# Patient Record
Sex: Male | Born: 1965 | Race: White | Hispanic: No | Marital: Married | Smoking: Never smoker
Health system: Southern US, Community
[De-identification: ages and names within clinical notes are randomized; demographics above are authoritative.]

## PROBLEM LIST (undated history)

## (undated) DIAGNOSIS — IMO0001 Reserved for inherently not codable concepts without codable children: Secondary | ICD-10-CM

## (undated) DIAGNOSIS — F32A Depression, unspecified: Secondary | ICD-10-CM

## (undated) DIAGNOSIS — F329 Major depressive disorder, single episode, unspecified: Secondary | ICD-10-CM

## (undated) DIAGNOSIS — C719 Malignant neoplasm of brain, unspecified: Principal | ICD-10-CM

## (undated) DIAGNOSIS — R7989 Other specified abnormal findings of blood chemistry: Secondary | ICD-10-CM

## (undated) DIAGNOSIS — C4490 Unspecified malignant neoplasm of skin, unspecified: Secondary | ICD-10-CM

## (undated) DIAGNOSIS — IMO0002 Reserved for concepts with insufficient information to code with codable children: Secondary | ICD-10-CM

## (undated) HISTORY — DX: Reserved for concepts with insufficient information to code with codable children: IMO0002

## (undated) HISTORY — DX: Malignant neoplasm of brain, unspecified: C71.9

## (undated) HISTORY — DX: Reserved for inherently not codable concepts without codable children: IMO0001

## (undated) HISTORY — DX: Other specified abnormal findings of blood chemistry: R79.89

## (undated) SURGERY — Surgical Case
Anesthesia: *Unknown

---

## 1986-09-09 DIAGNOSIS — C4490 Unspecified malignant neoplasm of skin, unspecified: Secondary | ICD-10-CM

## 1986-09-09 HISTORY — DX: Unspecified malignant neoplasm of skin, unspecified: C44.90

## 1986-09-09 HISTORY — PX: OTHER SURGICAL HISTORY: SHX169

## 1994-09-09 HISTORY — PX: BACK SURGERY: SHX140

## 2011-09-01 ENCOUNTER — Emergency Department (HOSPITAL_COMMUNITY): Payer: BC Managed Care – PPO

## 2011-09-01 ENCOUNTER — Other Ambulatory Visit: Payer: Self-pay

## 2011-09-01 ENCOUNTER — Inpatient Hospital Stay (HOSPITAL_COMMUNITY)
Admission: EM | Admit: 2011-09-01 | Discharge: 2011-09-06 | DRG: 001 | Disposition: A | Discharge status: Home / Self Care | Payer: BC Managed Care – PPO | Attending: Internal Medicine | Admitting: Internal Medicine

## 2011-09-01 DIAGNOSIS — R7309 Other abnormal glucose: Secondary | ICD-10-CM | POA: Diagnosis not present

## 2011-09-01 DIAGNOSIS — G936 Cerebral edema: Secondary | ICD-10-CM | POA: Diagnosis present

## 2011-09-01 DIAGNOSIS — D496 Neoplasm of unspecified behavior of brain: Principal | ICD-10-CM | POA: Diagnosis present

## 2011-09-01 DIAGNOSIS — G9389 Other specified disorders of brain: Secondary | ICD-10-CM

## 2011-09-01 DIAGNOSIS — R569 Unspecified convulsions: Secondary | ICD-10-CM | POA: Diagnosis present

## 2011-09-01 DIAGNOSIS — Z85828 Personal history of other malignant neoplasm of skin: Secondary | ICD-10-CM

## 2011-09-01 DIAGNOSIS — F4321 Adjustment disorder with depressed mood: Secondary | ICD-10-CM | POA: Diagnosis not present

## 2011-09-01 HISTORY — DX: Major depressive disorder, single episode, unspecified: F32.9

## 2011-09-01 HISTORY — DX: Unspecified malignant neoplasm of skin, unspecified: C44.90

## 2011-09-01 HISTORY — DX: Depression, unspecified: F32.A

## 2011-09-01 LAB — CBC
HCT: 43 % (ref 39.0–52.0)
Hemoglobin: 14.4 g/dL (ref 13.0–17.0)
MCH: 28.5 pg (ref 26.0–34.0)
MCHC: 33.8 g/dL (ref 30.0–36.0)
MCHC: 33.5 g/dL (ref 30.0–36.0)
MCV: 85.1 fL (ref 78.0–100.0)
MCV: 85.1 fL (ref 78.0–100.0)
Platelets: 147 10*3/uL — ABNORMAL LOW (ref 150–400)
Platelets: 134 10*3/uL — ABNORMAL LOW (ref 150–400)
RBC: 5.05 MIL/uL (ref 4.22–5.81)
RDW: 12.8 % (ref 11.5–15.5)
RDW: 12.9 % (ref 11.5–15.5)
WBC: 12.4 10*3/uL — ABNORMAL HIGH (ref 4.0–10.5)
WBC: 9.4 10*3/uL (ref 4.0–10.5)

## 2011-09-01 LAB — COMPREHENSIVE METABOLIC PANEL
ALT: 17 U/L (ref 0–53)
Alkaline Phosphatase: 63 U/L (ref 39–117)
BUN: 11 mg/dL (ref 6–23)
Chloride: 106 mEq/L (ref 96–112)
GFR calc Af Amer: >90 mL/min (ref >90)
Glucose, Bld: 160 mg/dL — ABNORMAL HIGH (ref 70–99)
Potassium: 3.9 mEq/L (ref 3.5–5.1)
Sodium: 140 mEq/L (ref 135–145)
Total Bilirubin: 0.2 mg/dL — ABNORMAL LOW (ref 0.3–1.2)

## 2011-09-01 LAB — PHENYTOIN LEVEL, TOTAL: Phenytoin Lvl: 13.6 ug/mL (ref 10.0–20.0)

## 2011-09-01 LAB — CREATININE, SERUM
Creatinine, Ser: 0.91 mg/dL (ref 0.50–1.35)
GFR calc Af Amer: >90 mL/min (ref >90)

## 2011-09-01 LAB — MAGNESIUM: Magnesium: 2.2 mg/dL (ref 1.5–2.5)

## 2011-09-01 MED ORDER — ACETAMINOPHEN 325 MG PO TABS: 650.0000 mg | ORAL_TABLET | Freq: Four times a day (QID) | ORAL | Status: DC | PRN

## 2011-09-01 MED ORDER — ACETAMINOPHEN 650 MG RE SUPP: 650.0000 mg | Freq: Four times a day (QID) | RECTAL | Status: DC | PRN

## 2011-09-01 MED ORDER — PHENYTOIN SODIUM 50 MG/ML IJ SOLN
100.0000 mg | Freq: Three times a day (TID) | INTRAMUSCULAR | Status: DC
  Filled 2011-09-01: qty 2

## 2011-09-01 MED ORDER — THERA M PLUS PO TABS
1.0000 | ORAL_TABLET | Freq: Every day | ORAL | Status: DC
  Administered 2011-09-01 – 2011-09-06 (×5): 1 via ORAL
  Filled 2011-09-01 (×6): qty 1

## 2011-09-01 MED ORDER — OXYCODONE HCL 5 MG PO TABS: 5.0000 mg | ORAL_TABLET | ORAL | Status: DC | PRN

## 2011-09-01 MED ORDER — GADOBENATE DIMEGLUMINE 529 MG/ML IV SOLN
15.0000 mL | Freq: Once | INTRAVENOUS | Status: AC | PRN
  Administered 2011-09-01: 15 mL via INTRAVENOUS

## 2011-09-01 MED ORDER — MORPHINE SULFATE 2 MG/ML IJ SOLN: 2.0000 mg | INTRAMUSCULAR | Status: DC | PRN

## 2011-09-01 MED ORDER — PHENYTOIN SODIUM 50 MG/ML IJ SOLN
100.0000 mg | Freq: Three times a day (TID) | INTRAMUSCULAR | Status: DC
  Filled 2011-09-01 (×2): qty 2

## 2011-09-01 MED ORDER — DEXAMETHASONE SODIUM PHOSPHATE 10 MG/ML IJ SOLN
10.0000 mg | INTRAMUSCULAR | Status: AC
  Administered 2011-09-01: 10 mg via INTRAVENOUS
  Filled 2011-09-01: qty 1

## 2011-09-01 MED ORDER — DEXAMETHASONE SODIUM PHOSPHATE 4 MG/ML IJ SOLN
4.0000 mg | Freq: Four times a day (QID) | INTRAMUSCULAR | Status: DC
  Administered 2011-09-01 – 2011-09-05 (×14): 4 mg via INTRAVENOUS
  Filled 2011-09-01 (×21): qty 1

## 2011-09-01 MED ORDER — ONDANSETRON HCL 4 MG PO TABS: 4.0000 mg | ORAL_TABLET | Freq: Four times a day (QID) | ORAL | Status: DC | PRN

## 2011-09-01 MED ORDER — ONDANSETRON HCL 4 MG/2ML IJ SOLN: 4.0000 mg | Freq: Four times a day (QID) | INTRAMUSCULAR | Status: DC | PRN

## 2011-09-01 MED ORDER — PHENYTOIN SODIUM 50 MG/ML IJ SOLN
100.0000 mg | Freq: Three times a day (TID) | INTRAMUSCULAR | Status: DC
  Administered 2011-09-02 (×2): 100 mg via INTRAVENOUS
  Filled 2011-09-01 (×5): qty 2

## 2011-09-01 MED ORDER — SODIUM CHLORIDE 0.9 % IV BOLUS (SEPSIS)
250.0000 mL | Freq: Once | INTRAVENOUS | Status: AC
  Administered 2011-09-01: 250 mL via INTRAVENOUS

## 2011-09-01 MED ORDER — HEPARIN SODIUM (PORCINE) 5000 UNIT/ML IJ SOLN
5000.0000 [IU] | Freq: Three times a day (TID) | INTRAMUSCULAR | Status: DC
  Administered 2011-09-01 – 2011-09-04 (×8): 5000 [IU] via SUBCUTANEOUS
  Filled 2011-09-01 (×11): qty 1

## 2011-09-01 MED ORDER — SODIUM CHLORIDE 0.9 % IV SOLN
INTRAVENOUS | Status: DC
  Administered 2011-09-01: 22:00:00 via INTRAVENOUS
  Administered 2011-09-02: 20 mL/h via INTRAVENOUS
  Administered 2011-09-02: 100 mL/h via INTRAVENOUS

## 2011-09-01 MED ORDER — SODIUM CHLORIDE 0.9 % IV SOLN
1000.0000 mg | Freq: Once | INTRAVENOUS | Status: AC
  Administered 2011-09-01: 1000 mg via INTRAVENOUS
  Filled 2011-09-01 (×2): qty 20

## 2011-09-01 MED ORDER — OMEGA-3-ACID ETHYL ESTERS 1 G PO CAPS
1.0000 g | ORAL_CAPSULE | Freq: Every day | ORAL | Status: DC
  Administered 2011-09-01 – 2011-09-03 (×3): 1 g via ORAL
  Filled 2011-09-01 (×4): qty 1

## 2011-09-01 NOTE — Consult Note (Signed)
Reason for Consult:New onset seizure  Referring Physician: Deretha Emory  CC: Seizure  HPI: Nathan Prince is an 45 y.o. male who presents after having had a seizure.  Wife reports that they were in bed and everything was well.  At about 1100 she began to ask him some questions about what he wanted to eat and he was unable to respond intelligibly.  Then when she asked to see his tongue he pointed to his nose.  Soon after the patient sat up in bed and had what is described as a tonic-clonic seizure.  EMS was called at that time.  Patient has remained confused and unable to communicate appropriately.    Past Medical History  Diagnosis Date  . Depression   . Skin cancer     History reviewed. No pertinent past surgical history.  History reviewed. No pertinent family history.  Social History:  reports that he has never smoked. He does not have any smokeless tobacco history on file. He reports that he does not drink alcohol or use illicit drugs.  No Known Allergies  Medications:  I have reviewed the patient's current medications. Prior to Admission:  Fish Oil, MVI, Omega-3 Scheduled:   . sodium chloride  250 mL Intravenous Once       Propofol  ROS: History obtained from wife  General ROS: negative for - chills, fatigue, fever, night sweats, weight gain or weight loss Psychological ROS: confusion Ophthalmic ROS: negative for - blurry vision, double vision, eye pain or loss of vision ENT ROS: negative for - epistaxis, nasal discharge, oral lesions, sore throat, tinnitus or vertigo Allergy and Immunology ROS: negative for - hives or itchy/watery eyes Hematological and Lymphatic ROS: negative for - bleeding problems, bruising or swollen lymph nodes Endocrine ROS: negative for - galactorrhea, hair pattern changes, polydipsia/polyuria or temperature intolerance Respiratory ROS: negative for - cough, hemoptysis, shortness of breath or wheezing Cardiovascular ROS: negative for - chest pain,  dyspnea on exertion, edema or irregular heartbeat Gastrointestinal ROS: negative for - abdominal pain, diarrhea, hematemesis, nausea/vomiting or stool incontinence Genito-Urinary ROS: negative for - dysuria, hematuria, incontinence or urinary frequency/urgency Musculoskeletal ROS: negative for - joint swelling or muscular weakness Neurological ROS: as noted in HPI Dermatological ROS: negative for rash and skin lesion changes  Blood pressure 118/53, pulse 78, temperature 96.1 F (35.6 C), temperature source Axillary, resp. rate 20, SpO2 100.00%.  Neurologic Examination: Mental Status: Lethargic. Paraphasic errors.  Speech non-fluent.  Unable to follow commands. Cranial Nerves: II: visual fields grossly normal, pupils equal, round, reactive to light and accommodation III,IV, VI: ptosis not present, extra-ocular motions intact bilaterally V,VII: right facial droop, facial light touch sensation normal bilaterally VIII: hearing normal bilaterally IX,X: gag reflex present XI: trapezius strength/neck flexion strength normal bilaterally XII: tongue strength normal  Motor: Right : Upper extremity   5/5    Left:     Upper extremity   5/5  Lower extremity   5/5     Lower extremity   5/5 Tone and bulk:normal tone throughout; no atrophy noted Sensory: Pinprick and light touch intact throughout, bilaterally Deep Tendon Reflexes: 2+ and symmetric with absent AJ's bilaterally Plantars: Right: equivocal   Left: equivocal Cerebellar: Unable to follow commands to perform  Results for orders placed during the hospital encounter of 09/01/11 (from the past 48 hour(s))  COMPREHENSIVE METABOLIC PANEL     Status: Abnormal   Collection Time   09/01/11  2:35 PM      Component Value Range Comment  Sodium 140  135 - 145 (mEq/L)    Potassium 3.9  3.5 - 5.1 (mEq/L)    Chloride 106  96 - 112 (mEq/L)    CO2 25  19 - 32 (mEq/L)    Glucose, Bld 160 (*) 70 - 99 (mg/dL)    BUN 11  6 - 23 (mg/dL)     Creatinine, Ser 0.98  0.50 - 1.35 (mg/dL)    Calcium 9.3  8.4 - 10.5 (mg/dL)    Total Protein 6.6  6.0 - 8.3 (g/dL)    Albumin 3.9  3.5 - 5.2 (g/dL)    AST 17  0 - 37 (U/L)    ALT 17  0 - 53 (U/L)    Alkaline Phosphatase 63  39 - 117 (U/L)    Total Bilirubin 0.2 (*) 0.3 - 1.2 (mg/dL)    GFR calc non Af Amer 85 (*) >90 (mL/min)    GFR calc Af Amer >90  >90 (mL/min)   CBC     Status: Abnormal   Collection Time   09/01/11  2:35 PM      Component Value Range Comment   WBC 12.4 (*) 4.0 - 10.5 (K/uL)    RBC 5.11  4.22 - 5.81 (MIL/uL)    Hemoglobin 14.7  13.0 - 17.0 (g/dL)    HCT 11.9  14.7 - 82.9 (%)    MCV 85.1  78.0 - 100.0 (fL)    MCH 28.8  26.0 - 34.0 (pg)    MCHC 33.8  30.0 - 36.0 (g/dL)    RDW 56.2  13.0 - 86.5 (%)    Platelets 147 (*) 150 - 400 (K/uL)     Ct Head Wo Contrast  09/01/2011  *RADIOLOGY REPORT*  Clinical Data: Seizure  CT HEAD WITHOUT CONTRAST  Technique:  Contiguous axial images were obtained from the base of the skull through the vertex without contrast.  Comparison: None.  Findings: There is an approximately 3 cm x 4 cm hypodensity in the left temporal lobe which extends to the cortical margin of the left temporal operculum (images 16 through 9).  This finding is most suggestive of a cortical infarction but cannot exclude an underlying neoplasm.  There is no intracranial hemorrhage evident. No midline shift or mass effect.  No hydrocephalus.  Paranasal sinuses and mastoid air cells are clear.  Orbits are normal.  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  Cannot exclude underlying neoplasm.  Recommend brain MRI for further evaluation.  Findings discussed with Dr. Deretha Emory on 09/01/2011 at 1415 hours  Original Report Authenticated By: Genevive Bi, M.D.     Assessment/Plan:  Patient Active Hospital Problem List: New Onset Seizure   Assessment: New onset seizure in patient with hypodensity in the left temporal lobe.  Likely etiology  of seizures.  Etiology of hypodensity unclear-?mass lesion versus stroke.     Plan: 1. MRI of the brain with contrast  2. Dilantin 1,000mg  IV load. Level 2 hours after load with maintenance of 100mg  IV q8hrs             3. Dilantin level in AM  4. EEG  5. Seizure precautions  Thana Farr, MD [facility] [phone] 09/01/2011, 4:01 PM

## 2011-09-01 NOTE — ED Provider Notes (Signed)
Medical screening examination/treatment/procedure(s) were conducted as a shared visit with non-physician practitioner(s) and myself.  I personally evaluated the patient during the encounter  CT scan now reveals of left temporal lobe mass questionable infarct most likely mass. Patient not back to normal but able to verbal lives in answer questions. Event somewhere around 11:00 or 11:30 this morning.  Discussed with on-call neurology will get admitted by internal medicine MRI will be ordered. Neurology also suspects tumor.    Shelda Jakes, MD 09/01/11 [phone]

## 2011-09-01 NOTE — Progress Notes (Signed)
MEDICATION RELATED CONSULT NOTE - INITIAL   Pharmacy Consult:  Dilantin Indication:  seizure  No Known Allergies  Patient Measurements: Height: 5\' 10"  (177.8 cm) Weight: 177 lb 7.5 oz (80.5 kg) IBW/kg (Calculated) : 73   Vital Signs: Temp: 98.2 F (36.8 C) (12/23 2030) Temp src: Oral (12/23 2030) BP: 126/73 mmHg (12/23 2030) Pulse Rate: 67  (12/23 2030)  Labs:  Basename 09/01/11 1435  WBC 12.4*  HGB 14.7  HCT 43.5  PLT 147*  APTT --  CREATININE 1.03  LABCREA --  CREATININE 1.03  CREAT24HRUR --  MG --  PHOS --  ALBUMIN 3.9  PROT 6.6  ALBUMIN 3.9  AST 17  ALT 17  ALKPHOS 63  BILITOT 0.2*  BILIDIR --  IBILI --   Estimated Creatinine Clearance: 91.5 ml/min (by C-G formula based on Cr of 1.03).   Microbiology: No results found for this or any previous visit (from the past 720 hour(s)).  Medical History: Past Medical History  Diagnosis Date  . Depression   . Skin cancer     Assessment:  seizure Admit Complaint: Pharmacist System-Based Medication Review: Anticoagulation:  N/A Infectious Disease:  N/A Cardiovascular:  No significant hx, VSS, on [place] Endocrinology:  N/A Gastrointestinal / Nutrition:  NPO Neurology:  Hx depression.  Admitted post tonic-clonic seizure from home, head CT showed intracranial neoplasm in which dexamethasone was started.  Patient received Dilantin 1000mg  IV load in the ED and was started on Dilantin 100mg  IV Q8H. Nephrology:  SCr 1.03, CrCL 92 ml/min, lytes WNL Pulmonary:  RA Hematology / Oncology;  Hx skin cancer left upper chest 1988.  H/H WNL, plts slightly low 147 Best Practices:  Heparin SQ  Goal of Therapy:  Corrected dilantin level 10-20 mcg/mL  Plan:  1.  Continue dilantin 100mg  IV Q8H as ordered by Neuro 2.  F/U dilantin level post load and in AM 3.  Dilantin level at steady-state (5-7 days on maintenance regimen).  Phillips Climes Dien 09/01/2011,9:34 PM

## 2011-09-01 NOTE — ED Notes (Signed)
MD at bedside. (Dr. Reynolds at the bedside).  

## 2011-09-01 NOTE — ED Notes (Addendum)
N[facility] [name]  [facility]  [phone]   [phone]

## 2011-09-01 NOTE — ED Provider Notes (Signed)
History     CSN: [id number]  Arrival date & time 09/01/11  1320   First MD Initiated Contact with Patient 09/01/11 1356      Chief Complaint  Patient presents with  . Seizures    (Consider location/radiation/quality/duration/timing/severity/associated sxs/prior treatment) The history is provided by the patient, the spouse, the EMS personnel and a friend.    Pt presents to the ED by EMS after having a first time seizure. Pt was last normal at 11:00am (approx) when he was intimate with her and had an acute onset headache in which he was then unable to speak what he wanted to say. He then had a seizure with incontinence. Currently the patient is post ictal, will respond to questions if asked but not appropriately. 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 No chemo or radiation was done but that patient has since then had close follow up. No hx of neoplasm in the brain.   Past Medical History  Diagnosis Date  . Depression   . Skin cancer     History reviewed. No pertinent past surgical history.  History reviewed. No pertinent family history.  History  Substance Use Topics  . Smoking status: Never Smoker   . Smokeless tobacco: Not on file  . Alcohol Use: No      Review of Systems  All other systems reviewed and are negative.    Allergies  Review of patient's allergies indicates no known allergies.  Home Medications   Current Outpatient Rx  Name Route Sig Dispense Refill  . OMEGA-3 FATTY ACIDS 1000 MG PO CAPS Oral Take 1 g by mouth daily.      Carma Leaven M PLUS PO TABS Oral Take 1 tablet by mouth daily.      . OMEGA-3 KRILL OIL PO Oral Take 1 capsule by mouth daily.        BP 112/74  Pulse 82  Temp(Src) 96.1 F (35.6 C) (Axillary)  Resp 22  SpO2 100%  Physical Exam  Nursing note and vitals reviewed. Constitutional: He appears well-developed and well-nourished. He is easily aroused. He has  a sickly appearance. No distress.  HENT:  Head: Normocephalic and atraumatic.  Eyes: Pupils are equal, round, and reactive to light.  Neck: Normal range of motion. No JVD present. No tracheal deviation present.  Cardiovascular: Normal rate, regular rhythm and normal heart sounds.   Pulmonary/Chest: Effort normal and breath sounds normal. No respiratory distress. He has no wheezes. He has no rales. He exhibits no tenderness.  Abdominal: Soft.  Musculoskeletal: Normal range of motion.  Neurological: He is alert and easily aroused.  Skin: Skin is warm and dry. He is not diaphoretic.    ED Course  Procedures (including critical care time)   Labs Reviewed  COMPREHENSIVE METABOLIC PANEL  CBC   Ct Head Wo Contrast  09/01/2011  *RADIOLOGY REPORT*  Clinical Data: Seizure  CT HEAD WITHOUT CONTRAST  Technique:  Contiguous axial images were obtained from the base of the skull through the vertex without contrast.  Comparison: None.  Findings: There is an approximately 3 cm x 4 cm hypodensity in the left temporal lobe which extends to the cortical margin of the left temporal operculum (images 16 through 9).  This finding is most suggestive of a cortical infarction but cannot exclude an underlying neoplasm.  There is no intracranial hemorrhage evident. No midline shift or mass effect.  No hydrocephalus.  Paranasal sinuses and mastoid air cells are clear.  Orbits are normal.  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  Cannot exclude underlying neoplasm.  Recommend brain MRI for further evaluation.  Findings discussed with Dr. Deretha Emory on 09/01/2011 at 1415 hours  Original Report Authenticated By: Genevive Bi, M.D.     1. Brain mass       MDM  CT scan of the brain is concerning for infarct or possibly a neoplasm. The radiologist recommend MRI with contrast. I have spoken with Dr. Thad Ranger (Neurology) who requests the patient be a medical admit and she will consult  on the patient.   Date: 09/01/2011  Rate: 87  Rhythm: normal sinus rhythm  QRS Axis: normal  Intervals: normal  ST/T Wave abnormalities: normal  Conduction Disutrbances:none  Narrative Interpretation:   Old EKG Reviewed: none available        Dorthula Matas, PA 09/01/11 1709

## 2011-09-01 NOTE — ED Notes (Signed)
Patient transported to MRI 

## 2011-09-01 NOTE — ED Notes (Signed)
Pt normal at 10 am after intimacy, at 1015 sudden onset HA and period of time where unable to process language, then full body seizure. Postictal, incontinent, combative initally

## 2011-09-01 NOTE — H&P (Addendum)
PCP:   No primary provider on file.   Chief Complaint:  Seizure episode this morning.  HPI: This is a 45 year old male, with known history of rare skin cancer left upper chest in 1988, treated with surgery at [facility], back surgery for ruptured disk 1996,  otherwise, no significant medical history. Patient is still somewhat weak and forgetful of preceding events, so history is supplied by spouse, who was present in ED. On waking up at about 11:30 AM, spouse got out of bed, and was getting ready to go out and get a sandwich. While patient was trying to decide which he wanted, he complained suddenly of a headache, his speech became confused, he sounded disoriented. Wife hurried off to get some sugar, but her son, who was at the bedside, yelled out, and she came back quickly, in time to see patient start "staring", become stiff, panting heavily. Wife had just reached for the phone, to call 911, when patient's teeth clenched, and he suffered a generalized seizure. There was no incontinence or tongue-biting, but he was drooling, and after about a minute, the episode subsided, but patient was unrousable. EMS arrived, and brought patient to ED, where he remained post-ictal for some time, and was seen by Dr Thad Ranger, neurologist. Due to concerning brain imaging studies, he was referred to the medical service, for treatment and appropriate work up..  Allergies:  No Known Allergies Allergic to cats.   Past Medical History  Diagnosis Date  . Depression   . Skin cancer     History reviewed. No pertinent past surgical history.  Prior to Admission medications   Medication Sig Start Date End Date Taking? Authorizing Provider  fish oil-omega-3 fatty acids 1000 MG capsule Take 1 g by mouth daily.     Yes Historical Provider, MD  Multiple Vitamins-Minerals (MULTIVITAMINS THER. W/MINERALS) TABS Take 1 tablet by mouth daily.     Yes Historical Provider, MD  OMEGA-3 KRILL OIL PO Take 1 capsule by  mouth daily.     Yes Historical Provider, MD    Social History:  reports that he has never smoked. He does not have any smokeless tobacco history on file. He reports that he does not drink alcohol or use illicit drugs. 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  History reviewed. No pertinent family history. Patient's mother died at age 22 years. She has multiple sclerosis and emphysema. His father died at age 71 years, from clon cancer.  Review of Systems:  As per HPI and chief complaint. Patent denies fatigue, diminished appetite, weight loss, fever, chills, headache, blurred vision, difficulty in speaking, dyspahgia, chest pain, cough, shortness of breath, orthopnea, paroxysmal nocturnal dyspnea, nausea, diaphoresis, abdominal pain, vomiting, diarrhea, belching, heartburn, hematemesis, melena, dysuria, nocturia, urinary frequency, hematochezia, lower extremity swelling, pain, or redness. The rest of the systems review is negative.  Physical Exam:  General:  Patient does not appear to be in obvious acute distress at the time of this evaluation, no longer post-ictal, although forgetful of recent events, alert, communicative, fully oriented, talking in complete sentences, not short of breath at rest.  HEENT:  No clinical pallor, no jaundice, no conjunctival injection or discharge. Hydration appears fair. NECK:  Supple, JVP not seen, no carotid bruits, no palpable lymphadenopathy, no palpable goiter. CHEST:  Clinically clear to auscultation, no wheezes, no crackles. Patient has a depressed scar, approximately  6 cm x 7 cm in left anterior upper chest, at site of previous skin  cancer surgery. No axillary lymphadenopathy. HEART:  Sounds 1 and 2 heard, normal, regular, no murmurs. ABDOMEN:  Full, soft, non-tender, no palpable organomegaly, no palpable masses, normal bowel sounds. GENITALIA:  Not examined. LOWER EXTREMITIES:  No pitting edema, palpable peripheral  pulses. MUSCULOSKELETAL SYSTEM:  Generalized osteoarthritic changes, otherwise, normal. CENTRAL NERVOUS SYSTEM:  No focal neurologic deficit on gross examination.  Labs on Admission:  Results for orders placed during the hospital encounter of 09/01/11 (from the past 48 hour(s))  COMPREHENSIVE METABOLIC PANEL     Status: Abnormal   Collection Time   09/01/11  2:35 PM      Component Value Range Comment   Sodium 140  135 - 145 (mEq/L)    Potassium 3.9  3.5 - 5.1 (mEq/L)    Chloride 106  96 - 112 (mEq/L)    CO2 25  19 - 32 (mEq/L)    Glucose, Bld 160 (*) 70 - 99 (mg/dL)    BUN 11  6 - 23 (mg/dL)    Creatinine, Ser 0.45  0.50 - 1.35 (mg/dL)    Calcium 9.3  8.4 - 10.5 (mg/dL)    Total Protein 6.6  6.0 - 8.3 (g/dL)    Albumin 3.9  3.5 - 5.2 (g/dL)    AST 17  0 - 37 (U/L)    ALT 17  0 - 53 (U/L)    Alkaline Phosphatase 63  39 - 117 (U/L)    Total Bilirubin 0.2 (*) 0.3 - 1.2 (mg/dL)    GFR calc non Af Amer 85 (*) >90 (mL/min)    GFR calc Af Amer >90  >90 (mL/min)   CBC     Status: Abnormal   Collection Time   09/01/11  2:35 PM      Component Value Range Comment   WBC 12.4 (*) 4.0 - 10.5 (K/uL)    RBC 5.11  4.22 - 5.81 (MIL/uL)    Hemoglobin 14.7  13.0 - 17.0 (g/dL)    HCT 40.9  81.1 - 91.4 (%)    MCV 85.1  78.0 - 100.0 (fL)    MCH 28.8  26.0 - 34.0 (pg)    MCHC 33.8  30.0 - 36.0 (g/dL)    RDW 78.2  95.6 - 21.3 (%)    Platelets 147 (*) 150 - 400 (K/uL)     Radiological Exams on Admission:  RADIOLOGY REPORT*  Clinical Data: Seizure  CT HEAD WITHOUT CONTRAST  Technique: Contiguous axial images were obtained from the base of  the skull through the vertex without contrast.  Comparison: None.  Findings: There is an approximately 3 cm x 4 cm hypodensity in the  left temporal lobe which extends to the cortical margin of the left  temporal operculum (images 16 through 9). This finding is most  suggestive of a cortical infarction but cannot exclude an  underlying neoplasm. There is  no intracranial hemorrhage evident.  No midline shift or mass effect. No hydrocephalus.  Paranasal sinuses and mastoid air cells are clear. Orbits are  normal.  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 Cannot  exclude underlying neoplasm. Recommend brain MRI for further  evaluation.  Findings discussed with Dr. Deretha Emory on 09/01/2011 at 1415 hours  Original Report Authenticated By: Genevive Bi, M.D.  Assessment/Plan  * Principal Problem. *  Seizure episode: Patient resented with a single witnessed seizure episode, probably focal, with secondary generalization. Head Ct scan demonstrated findings consistent with probable cortical infarction within the left temporal lobe, although certain  features raised suspicion for possible intracranial neoplasm. An MRI has been done, but at the time of this evaluation, formal report was still pending. I did discuss with Dr Thad Ranger, who has already seen the patient and provided a consultation, and it appears that preliminary  MRI findings are suspicious for solitary, possibly metastatic neoplasm. Patient has already been loaded with Dilantin in the ED, and we shall continue scheduled Dilantin for now. He will be admitted to SDU, have neurochecks, and Chest/Abdominal/Pelvic CT, will be arranged, to search for a possible primary neoplasm. evaluation.    Further management will depend on clinical course.  Time Spent on Admission: 45 mins.  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 Have started dexamethasone.  C. Tavion Senkbeil. MD.

## 2011-09-01 NOTE — ED Notes (Signed)
Paged [facility] 

## 2011-09-02 ENCOUNTER — Inpatient Hospital Stay (HOSPITAL_COMMUNITY): Payer: BC Managed Care – PPO

## 2011-09-02 ENCOUNTER — Encounter: Payer: Self-pay | Admitting: Neurosurgery

## 2011-09-02 ENCOUNTER — Other Ambulatory Visit: Payer: Self-pay | Admitting: Neurosurgery

## 2011-09-02 LAB — COMPREHENSIVE METABOLIC PANEL
ALT: 18 U/L (ref 0–53)
AST: 23 U/L (ref 0–37)
Albumin: 4.1 g/dL (ref 3.5–5.2)
Calcium: 10.1 mg/dL (ref 8.4–10.5)
Creatinine, Ser: 1.05 mg/dL (ref 0.50–1.35)
Sodium: 141 mEq/L (ref 135–145)

## 2011-09-02 LAB — CBC
HCT: 45.9 % (ref 39.0–52.0)
Hemoglobin: 15.4 g/dL (ref 13.0–17.0)
MCH: 28.7 pg (ref 26.0–34.0)
MCHC: 33.6 g/dL (ref 30.0–36.0)
MCV: 85.6 fL (ref 78.0–100.0)
RBC: 5.36 MIL/uL (ref 4.22–5.81)

## 2011-09-02 LAB — MRSA PCR SCREENING: MRSA by PCR: NEGATIVE

## 2011-09-02 MED ORDER — ZOLPIDEM TARTRATE 5 MG PO TABS: 5.0000 mg | ORAL_TABLET | Freq: Every evening | ORAL | Status: DC | PRN

## 2011-09-02 MED ORDER — GADOBENATE DIMEGLUMINE 529 MG/ML IV SOLN
17.0000 mL | Freq: Once | INTRAVENOUS | Status: AC | PRN
  Administered 2011-09-02: 17 mL via INTRAVENOUS

## 2011-09-02 MED ORDER — IOHEXOL 300 MG/ML  SOLN: 100.0000 mL | Freq: Once | INTRAMUSCULAR | Status: AC | PRN

## 2011-09-02 MED ORDER — ALPRAZOLAM 0.25 MG PO TABS
0.2500 mg | ORAL_TABLET | Freq: Three times a day (TID) | ORAL | Status: DC | PRN
  Administered 2011-09-02: 0.25 mg via ORAL
  Filled 2011-09-02: qty 1

## 2011-09-02 MED ORDER — PHENYTOIN SODIUM EXTENDED 100 MG PO CAPS
300.0000 mg | ORAL_CAPSULE | Freq: Every day | ORAL | Status: DC
  Administered 2011-09-02 – 2011-09-04 (×3): 300 mg via ORAL
  Filled 2011-09-02 (×4): qty 3

## 2011-09-02 NOTE — Progress Notes (Signed)
Subjective: Patient awake and alert today.  Has had no further seizures overnight.  Speech improved.  Only reports some difficulty remembering some things from work.  MRI reviewed with Dr. Margo Aye from neuroradiology.  Left temporal lesion clearly not a stroke.  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  Objective: Vital signs in last 24 hours: Temp:  [96.1 F (35.6 C)-98.7 F (37.1 C)] 98.7 F (37.1 C) (12/24 0800) Pulse Rate:  [67-93] 93  (12/24 0800) Resp:  [13-22] 22  (12/24 0800) BP: (112-129)/(53-76) 117/70 mmHg (12/24 0800) SpO2:  [96 %-100 %] 96 % (12/24 0800) Weight:  [80.5 kg (177 lb 7.5 oz)] 177 lb 7.5 oz (80.5 kg) (12/23 2030)  Intake/Output from previous day: 12/23 0701 - 12/24 0700 In: 1140 [P.O.:240; I.V.:900] Out: 1050 [Urine:1050] Intake/Output this shift: Total I/O In: 100 [I.V.:100] Out: -  Nutritional status: General  Neurologic Exam: Mental Status: Alert, oriented, thought content appropriate.  Speech fluent without evidence of aphasia.  Able to follow 3 step commands without difficulty. Cranial Nerves: II: visual fields grossly normal, pupils equal, round, reactive to light and accommodation III,IV, VI: ptosis not present, extra-ocular motions intact bilaterally V,VII: decrease in right NLF, facial light touch sensation normal bilaterally VIII: hearing normal bilaterally IX,X: gag reflex present XI: trapezius strength/neck flexion strength normal bilaterally XII: tongue strength normal  Motor: Right : Upper extremity   5-/5, no drift    Left:     Upper extremity   5/5  Lower extremity   5/5       Lower extremity   5/5 Tone and bulk:normal tone throughout; no atrophy noted Sensory: Pinprick and light touch intact throughout, bilaterally Deep Tendon Reflexes: 2+ and symmetric throughout Plantars: Right: equivocal   Left: equivocal Cerebellar: normal finger-to-nose and normal heel-to-shin test  Lab  Results:  Basename 09/02/11 0430 09/01/11 2145 09/01/11 1435  WBC 8.4 9.4 --  HGB 15.4 14.4 --  HCT 45.9 43.0 --  PLT 200 134* --  NA 141 -- 140  K 4.4 -- 3.9  CL 107 -- 106  CO2 25 -- 25  GLUCOSE 163* -- 160*  BUN 10 -- 11  CREATININE 1.05 0.91 --  CALCIUM 10.1 -- 9.3  LABA1C -- -- --  Dilantin level- 14.8  Studies/Results: Ct Head Wo Contrast  09/01/2011  *RADIOLOGY REPORT*  Clinical Data: Seizure  CT HEAD WITHOUT CONTRAST  Technique:  Contiguous axial images were obtained from the base of the skull through the vertex without contrast.  Comparison: None.  Findings: There is an approximately 3 cm x 4 cm hypodensity in the left temporal lobe which extends to the cortical margin of the left temporal operculum (images 16 through 9).  This finding is most suggestive of a cortical infarction but cannot exclude an underlying neoplasm.  There is no intracranial hemorrhage evident. No midline shift or mass effect.  No hydrocephalus.  Paranasal sinuses and mastoid air cells are clear.  Orbits are normal.  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  Cannot exclude underlying neoplasm.  Recommend brain MRI for further evaluation.  Findings discussed with Dr. Deretha Emory on 09/01/2011 at 1415 hours  Original Report Authenticated By: Genevive Bi, M.D.   Mr Laqueta Jean WU Contrast  09/02/2011  **ADDENDUM** CREATED: 09/02/2011 08:17:30  Study reviewed in person with Dr. Thana Farr at 0815 hours on 09/02/2011.  **END ADDENDUM** SIGNED BY: Harley Hallmark, M.D.   09/02/2011  *RADIOLOGY REPORT*  Clinical Data:  45 year old male with episode of altered mental status followed by seizure.  Abnormal head CT.  MRI HEAD WITHOUT AND WITH CONTRAST  Technique:  Multiplanar, multiecho pulse sequences of the brain and surrounding structures were obtained according to standard protocol without and with intravenous contrast  Contrast: 15mL MULTIHANCE GADOBENATE DIMEGLUMINE 529 MG/ML IV  SOLN  Comparison: Head CT without contrast 09/01/2011.  Findings: Abnormal left temporal lobe as seen on the earlier CT demonstrates mass-like T2 and FLAIR hyperintensity with distortion of the left mesial temporal lobe structures as well as the fusiform gyrus inferior temporal gyrus and middle temporal gyrus.  The superior temporal gyrus is spared.  Abnormal T2 and FLAIR hyperintensity tracks into the temporal lobe white matter and temporal stem (series 10 image 13). The abnormal area encompasses 49 x 37 x 47 mm (AP by CC by transverse).  There is no dark T2 or MR capsule.  Diffusion is facilitated throughout the area.  No hemorrhage or mineralization is evident.  There is mass effect on the left temporal horn without trapping or ventriculomegaly.  There is questionable petechial tight postcontrast enhancement centrally within the lesion (series 12 image 24) versus intrinsic T1 signal.  No other brain lesion is identified.  There is no significant mass effect.  No midline shift. No restricted diffusion to suggest acute infarction.  Negative pituitary, cervicomedullary junction and visualized cervical spine. No extra-axial collection.  Major intracranial vascular flow voids are preserved.  Gray-white matter signal outside of the left temporal lobe is within normal limits for age; with occasional cerebral white matter foci of T2 and FLAIR hyperintensity air noted. Bone marrow signal is within normal limits.  Visualized paranasal sinuses and mastoids are clear.  Visualized orbit soft tissues are within normal limits.  Negative scalp soft tissues.  IMPRESSION: Abnormal left temporal lobe.  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 Abnormality encompasses 49 x 37 x 47 mm. Cerebritis or other inflammatory process is a less likely consideration.  There is no abscess formation.  Original Report Authenticated By: Harley Hallmark, M.D.    Medications:  I have reviewed the  patient's current medications. Scheduled:   . dexamethasone  10 mg Intravenous STAT  . dexamethasone  4 mg Intravenous Q6H  . heparin  5,000 Units Subcutaneous Q8H  . multivitamins ther. w/minerals  1 tablet Oral Daily  . omega-3 acid ethyl esters  1 g Oral Daily  . phenytoin (DILANTIN) IV  1,000 mg Intravenous Once  . phenytoin (DILANTIN) IV  100 mg Intravenous Q8H  . sodium chloride  250 mL Intravenous Once  . DISCONTD: phenytoin (DILANTIN) IV  100 mg Intravenous Q8H  . DISCONTD: phenytoin (DILANTIN) IV  100 mg Intravenous Q8H    Assessment/Plan:  Patient Active Hospital Problem List: Seizure (09/01/2011)   Assessment: No further seizure noted since presentation.  On Dilantin.  Level therapeutic.    Plan: Continue Dilantin. Will change to po dosing Left temporal mass lesion   Assessment:  Although lesion seems primary by imaging, will rule out the possibility of a different primary.   Plan: 1. CT scans for work up of primary have been ordered  2. Neurosurgery consult for possible biopsy    3. Ambien for help with sleep on steroids.  4. Continue steroids    LOS: 1 day   Thana Farr, MD [facility] [phone] 09/02/2011  10:07 AM

## 2011-09-02 NOTE — Consult Note (Signed)
BP 130/64  Pulse 78  Temp(Src) 97.8 F (36.6 C) (Oral)  Resp 19  Ht 5\' 10"  (1.778 m)  Wt 80.5 kg (177 lb 7.5 oz)  BMI 25.46 kg/m2  SpO2 98% 45 y/0, male brought to the er after a seizure episode at home.ct and mri of the head was done. At present oriented x 3. paraphasical error with word findings mistakes. Dn nl. strenght nl. Sensory nl. dtr nl.radiological studies seen. Non-enhanced lesion in the posterior left temporal area. Spoke to him and wife about findings.we are going to get a mri with stealth protocol. The plan is to go ahead with an open biopsy on December 26. The risks were fully explained to them sucd as infection, bleeding, weakness. They are aware that the procedure will be a biopsy and not resection of the tumor.also , the need for mr Bhargava to be seen by oncology and radiation therapist.

## 2011-09-02 NOTE — Progress Notes (Signed)
Subjective: The patient's full history is reviewed in detail.  The patient is examined with his wife at the bedside.  He has no specific complaints at the present time.  He denies fevers chills nausea vomiting shortness of breath or chest pain.  There has been no recurrent seizure activity.  Objective: Weight change:   Intake/Output Summary (Last 24 hours) at 09/02/11 1723 Last data filed at 09/02/11 1700  Gross per 24 hour  Intake 1710.67 ml  Output   3200 ml  Net -1489.33 ml   Blood pressure 114/70, pulse 84, temperature 98.4 F (36.9 C), temperature source Oral, resp. rate 20, height 5\' 10"  (1.778 m), weight 80.5 kg (177 lb 7.5 oz), SpO2 96.00%.  Physical Exam: General: No acute respiratory distress Lungs: Clear to auscultation bilaterally without wheezes or crackles Cardiovascular: Regular rate and rhythm without murmur gallop or rub normal S1 and S2 Abdomen: Nontender, nondistended, soft, bowel sounds positive, no rebound, no ascites, no appreciable mass Extremities: No significant cyanosis, clubbing, or edema bilateral lower extremities  Lab Results:  [facility] 09/02/11 0430 09/01/11 2145 09/01/11 1435  NA 141 -- 140  K 4.4 -- 3.9  CL 107 -- 106  CO2 25 -- 25  GLUCOSE 163* -- 160*  BUN 10 -- 11  CREATININE 1.05 0.91 1.03  CALCIUM 10.1 -- 9.3  MG -- 2.2 --  PHOS -- -- --    Basename 09/02/11 0430 09/01/11 1435  AST 23 17  ALT 18 17  ALKPHOS 71 63  BILITOT 0.3 0.2*  PROT 7.2 6.6  ALBUMIN 4.1 3.9    Basename 09/02/11 0430 09/01/11 2145 09/01/11 1435  WBC 8.4 9.4 12.4*  NEUTROABS -- -- --  HGB 15.4 14.4 14.7  HCT 45.9 43.0 43.5  MCV 85.6 85.1 85.1  PLT 200 134* 147*   Micro Results: Recent Results (from the past 240 hour(s))  MRSA PCR SCREENING     Status: Normal   Collection Time   09/01/11  9:20 PM      Component Value Range Status Comment   MRSA by PCR NEGATIVE  NEGATIVE  Final    Assessment/Plan:  49 x 37 x 47 mm left temporal lobe mass Newly  diagnosed-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

## 2011-09-02 NOTE — Progress Notes (Signed)
   CARE MANAGEMENT NOTE 09/02/2011  Patient:  Nathan Prince,Nathan Prince   Account Number:  [id number]  Date Initiated:  09/02/2011  Documentation initiated by:  Onnie Boer  Subjective/Objective Assessment:   PT WAS ADMITTED WITH SZ ACTIVITY     Action/Plan:   PROGRESSION OF CARE AND DISCHARGE PLANNING   Anticipated DC Date:  09/04/2011   Anticipated DC Plan:  HOME/SELF CARE      DC Planning Services  CM consult      Choice offered to / List presented to:             Status of service:  In process, will continue to follow Medicare Important Message given?   (If response is "NO", the following Medicare IM given date fields will be blank) Date Medicare IM given:   Date Additional Medicare IM given:    Discharge Disposition:    Per UR Regulation:  Reviewed for med. necessity/level of care/duration of stay  Comments:  UR COMPLETED 09/02/2011 [name], RN,BSN 1507 PT WAS ADMITTED WITH A SZ, PTA PT WAS AT HOME WITH SELF CARE

## 2011-09-03 MED ORDER — ALPRAZOLAM 0.5 MG PO TABS
0.5000 mg | ORAL_TABLET | Freq: Three times a day (TID) | ORAL | Status: DC | PRN
  Administered 2011-09-03: 0.5 mg via ORAL
  Filled 2011-09-03: qty 1

## 2011-09-03 MED ORDER — ALPRAZOLAM 0.25 MG PO TABS
0.2500 mg | ORAL_TABLET | Freq: Once | ORAL | Status: AC
  Administered 2011-09-03: 0.25 mg via ORAL
  Filled 2011-09-03: qty 1

## 2011-09-03 NOTE — Progress Notes (Addendum)
Subjective: The patient is noted to be tearful when I entered the room. He reports that he slept well after receiving Xanax last night however, is not feeling anxious about the procedure tomorrow and at the thought that the cancer is terminal. He has no physical complaints today.    Objective: Weight change:   Intake/Output Summary (Last 24 hours) at 09/03/11 1119 Last data filed at 09/03/11 0813  Gross per 24 hour  Intake 430.67 ml  Output   1125 ml  Net -694.33 ml   Blood pressure 114/75, pulse 63, temperature 98.5 F (36.9 C), temperature source Oral, resp. rate 16, height 5\' 10"  (1.778 m), weight 80.5 kg (177 lb 7.5 oz), SpO2 96.00%.  Physical Exam: General: No acute respiratory distress Lungs: Clear to auscultation bilaterally without wheezes or crackles Cardiovascular: Regular rate and rhythm without murmur gallop or rub normal S1 and S2 Abdomen: Nontender, nondistended, soft, bowel sounds positive, no rebound, no ascites, no appreciable mass Extremities: No significant cyanosis, clubbing, or edema bilateral lower extremities  Lab Results:  [facility] 09/02/11 0430 09/01/11 2145 09/01/11 1435  NA 141 -- 140  K 4.4 -- 3.9  CL 107 -- 106  CO2 25 -- 25  GLUCOSE 163* -- 160*  BUN 10 -- 11  CREATININE 1.05 0.91 1.03  CALCIUM 10.1 -- 9.3  MG -- 2.2 --  PHOS -- -- --    Basename 09/02/11 0430 09/01/11 1435  AST 23 17  ALT 18 17  ALKPHOS 71 63  BILITOT 0.3 0.2*  PROT 7.2 6.6  ALBUMIN 4.1 3.9    Basename 09/02/11 0430 09/01/11 2145 09/01/11 1435  WBC 8.4 9.4 12.4*  NEUTROABS -- -- --  HGB 15.4 14.4 14.7  HCT 45.9 43.0 43.5  MCV 85.6 85.1 85.1  PLT 200 134* 147*   Micro Results: Recent Results (from the past 240 hour(s))  MRSA PCR SCREENING     Status: Normal   Collection Time   09/01/11  9:20 PM      Component Value Range Status Comment   MRSA by PCR NEGATIVE  NEGATIVE  Final    Assessment/Plan:  49 x 37 x 47 mm left temporal lobe mass Newly  diagnosed-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   New seizure activity Due to above finding-no recurrent seizure activity with steroids plus anti-epileptic medications-neurology has seen  Situational anxiety/depression Entirely understandable given the current clinical situation-SSRIs have no role here-Continue Xanax. Dr Lyman Speller has advised the patient that he will be increasing the frequency today.    LOS: 2 days  09/03/2011, 11:19 AM  Please note - this note was composed by and singed by Dr. Calvert Cantor - NOT Dr. Jetty Duhamel.

## 2011-09-03 NOTE — Consult Note (Signed)
Subjective: 45 years old man with new left temporal lobe mass and a seizure. Currently reports expressive aphasia. No seizures. Complains of anxiety.   Objective: Vital signs in last 24 hours: Temp:  [97.8 F (36.6 C)-98.5 F (36.9 C)] 98.5 F (36.9 C) (12/25 0810) Pulse Rate:  [63-97] 63  (12/25 0810) Resp:  [16-25] 16  (12/25 0810) BP: (112-130)/(64-75) 114/75 mmHg (12/25 0810) SpO2:  [96 %-98 %] 96 % (12/25 0810)  Intake/Output from previous day: 12/24 0701 - 12/25 0700 In: 830.7 [I.V.:830.7] Out: 1800 [Urine:1800] Intake/Output this shift: Total I/O In: -  Out: 275 [Urine:275] Nutritional status: General  Neurological exam: AAO*3. Mild expressive aphasia, word-finding difficulty. Was able to tell me months of the year forwards and backwards correctly, exhibiting good attention span. Followed complex commands. Cranial nerves: EOMI, PERRL. Visual fields were full. Sensation to V1 through V3 areas of the face was intact and symmetric throughout. There was no facial asymmetry. Shoulder shrug was 5/5 and symmetric bilaterally. Head rotation was 5/5 bilaterally. There was no dysarthria or palatal deviation. Motor: strength was 5/5 and symmetric throughout except distal RLE with strength of 5-/5 in plantar and dorsiflexion. Sensory: was intact throughout to light touch, pinprick, vibration and proprioception. Coordination: finger-to-nose and heel-to-shin were intact and symmetric bilaterally. Reflexes: were 2+ in upper extremities and 1+ at the knees and 1+ at the ankles. Plantar response was downgoing bilaterally. Gait: deferred  Lab Results:  Basename 09/02/11 0430 09/01/11 2145 09/01/11 1435  WBC 8.4 9.4 --  HGB 15.4 14.4 --  HCT 45.9 43.0 --  PLT 200 134* --  NA 141 -- 140  K 4.4 -- 3.9  CL 107 -- 106  CO2 25 -- 25  GLUCOSE 163* -- 160*  BUN 10 -- 11  CREATININE 1.05 0.91 --  CALCIUM 10.1 -- 9.3  LABA1C -- -- --   Studies/Results: Ct Head Wo Contrast  09/01/2011   *RADIOLOGY REPORT*  Clinical Data: Seizure  CT HEAD WITHOUT CONTRAST  Technique:  Contiguous axial images were obtained from the base of the skull through the vertex without contrast.  Comparison: None.  Findings: There is an approximately 3 cm x 4 cm hypodensity in the left temporal lobe which extends to the cortical margin of the left temporal operculum (images 16 through 9).  This finding is most suggestive of a cortical infarction but cannot exclude an underlying neoplasm.  There is no intracranial hemorrhage evident. No midline shift or mass effect.  No hydrocephalus.  Paranasal sinuses and mastoid air cells are clear.  Orbits are normal.  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  Cannot exclude underlying neoplasm.  Recommend brain MRI for further evaluation.  Findings discussed with Dr. Deretha Emory on 09/01/2011 at 1415 hours  Original Report Authenticated By: Genevive Bi, M.D.   Ct Chest W Contrast  09/02/2011  *RADIOLOGY REPORT*  Clinical Data:  Left temporal lobe brain mass.  Evaluate for primary neoplasm.  CT CHEST, ABDOMEN AND PELVIS WITH CONTRAST  Technique:  Multidetector CT imaging of the chest, abdomen and pelvis was performed following the standard protocol during bolus administration of intravenous contrast.  Contrast:  100 ml Omnipaque-300 and oral contrast  Comparison:  None  CT CHEST  Findings:  No evidence of mediastinal or hilar masses.  No lymphadenopathy seen elsewhere within the thorax.  No evidence of chest wall mass.  No evidence of pleural or pericardial effusion.  No suspicious pulmonary nodules or masses are identified.  No evidence of pulmonary infiltrate.  No central endobronchial lesions are identified.  No suspicious bone lesions identified.  IMPRESSION: Negative.  No evidence of mass, lymphadenopathy, or other active disease.  CT ABDOMEN AND PELVIS  Findings:  Numerous fluid attenuation hepatic cysts are seen throughout the right and left  lobes.  No definite solid liver masses are identified.  Several small renal cysts are noted bilaterally, however there is no evidence of complex cystic or solid renal masses.  No evidence of hydronephrosis.  Gallbladder sludge noted, however there is no evidence of acute cholecystitis or biliary ductal dilatation.  The pancreas, spleen, and adrenal glands are normal in appearance.  No soft tissue masses or lymphadenopathy seen elsewhere within the abdomen or pelvis.  No evidence of inflammatory process or abnormal fluid collections.  No evidence of bowel wall thickening or dilatation.  Normal appendix is visualized.  No suspicious bone lesions identified.  IMPRESSION:  1.  No evidence of primary malignancy or metastatic disease. 2.  Numerous hepatic and multiple bilateral small renal cysts. 3.  Probable gallbladder sludge.  No signs of acute cholecystitis or biliary dilatation.  Original Report Authenticated By: Danae Orleans, M.D.   Mr Laqueta Jean ZO Contrast  09/02/2011  **ADDENDUM** CREATED: 09/02/2011 08:17:30  Study reviewed in person with Dr. Thana Farr at 0815 hours on 09/02/2011.  **END ADDENDUM** SIGNED BY: Harley Hallmark, M.D.   09/02/2011  *RADIOLOGY REPORT*  Clinical Data: 45 year old male with episode of altered mental status followed by seizure.  Abnormal head CT.  MRI HEAD WITHOUT AND WITH CONTRAST  Technique:  Multiplanar, multiecho pulse sequences of the brain and surrounding structures were obtained according to standard protocol without and with intravenous contrast  Contrast: 15mL MULTIHANCE GADOBENATE DIMEGLUMINE 529 MG/ML IV SOLN  Comparison: Head CT without contrast 09/01/2011.  Findings: Abnormal left temporal lobe as seen on the earlier CT demonstrates mass-like T2 and FLAIR hyperintensity with distortion of the left mesial temporal lobe structures as well as the fusiform gyrus inferior temporal gyrus and middle temporal gyrus.  The superior temporal gyrus is spared.  Abnormal T2 and  FLAIR hyperintensity tracks into the temporal lobe white matter and temporal stem (series 10 image 13). The abnormal area encompasses 49 x 37 x 47 mm (AP by CC by transverse).  There is no dark T2 or MR capsule.  Diffusion is facilitated throughout the area.  No hemorrhage or mineralization is evident.  There is mass effect on the left temporal horn without trapping or ventriculomegaly.  There is questionable petechial tight postcontrast enhancement centrally within the lesion (series 12 image 24) versus intrinsic T1 signal.  No other brain lesion is identified.  There is no significant mass effect.  No midline shift. No restricted diffusion to suggest acute infarction.  Negative pituitary, cervicomedullary junction and visualized cervical spine. No extra-axial collection.  Major intracranial vascular flow voids are preserved.  Gray-white matter signal outside of the left temporal lobe is within normal limits for age; with occasional cerebral white matter foci of T2 and FLAIR hyperintensity air noted. Bone marrow signal is within normal limits.  Visualized paranasal sinuses and mastoids are clear.  Visualized orbit soft tissues are within normal limits.  Negative scalp soft tissues.  IMPRESSION: Abnormal left temporal lobe.  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 Abnormality encompasses 49 x 37 x 47 mm. Cerebritis or other inflammatory process is a less likely consideration.  There is no abscess formation.  Original Report Authenticated By:  H.LEE HALL III, M.D.   Ct Abdomen Pelvis W Contrast  09/02/2011  *RADIOLOGY REPORT*  Clinical Data:  Left temporal lobe brain mass.  Evaluate for primary neoplasm.  CT CHEST, ABDOMEN AND PELVIS WITH CONTRAST  Technique:  Multidetector CT imaging of the chest, abdomen and pelvis was performed following the standard protocol during bolus administration of intravenous contrast.  Contrast:  100 ml Omnipaque-300 and oral  contrast  Comparison:  None  CT CHEST  Findings:  No evidence of mediastinal or hilar masses.  No lymphadenopathy seen elsewhere within the thorax.  No evidence of chest wall mass.  No evidence of pleural or pericardial effusion.  No suspicious pulmonary nodules or masses are identified.  No evidence of pulmonary infiltrate.  No central endobronchial lesions are identified.  No suspicious bone lesions identified.  IMPRESSION: Negative.  No evidence of mass, lymphadenopathy, or other active disease.  CT ABDOMEN AND PELVIS  Findings:  Numerous fluid attenuation hepatic cysts are seen throughout the right and left lobes.  No definite solid liver masses are identified.  Several small renal cysts are noted bilaterally, however there is no evidence of complex cystic or solid renal masses.  No evidence of hydronephrosis.  Gallbladder sludge noted, however there is no evidence of acute cholecystitis or biliary ductal dilatation.  The pancreas, spleen, and adrenal glands are normal in appearance.  No soft tissue masses or lymphadenopathy seen elsewhere within the abdomen or pelvis.  No evidence of inflammatory process or abnormal fluid collections.  No evidence of bowel wall thickening or dilatation.  Normal appendix is visualized.  No suspicious bone lesions identified.  IMPRESSION:  1.  No evidence of primary malignancy or metastatic disease. 2.  Numerous hepatic and multiple bilateral small renal cysts. 3.  Probable gallbladder sludge.  No signs of acute cholecystitis or biliary dilatation.  Original Report Authenticated By: Danae Orleans, M.D.   Medications: I have reviewed the patient's current medications.  Assessment/Plan: 45 years old with left temporal lobe mass - for biopsy tomorrow on steroids 1) Cont. Decadron at current dose 2) Cont. Dilantin - level was therapeutic yesterday 3) Increased Xanax to 0.5 mg PO tid prn for patient comfort for anxiety/sleep - patient reports severe anxiety and xanax has been  partially helpful 4) Will follow  LOS: 2 days   Drucilla Cumber

## 2011-09-03 NOTE — Progress Notes (Signed)
Subjective: Patient reports Feels fair has questions about surgery namely what time  Objective: Vital signs in last 24 hours: Temp:  [98 F (36.7 C)-98.5 F (36.9 C)] 98.5 F (36.9 C) (12/25 0810) Pulse Rate:  [63-97] 63  (12/25 0810) Resp:  [16-25] 16  (12/25 0810) BP: (112-124)/(66-75) 114/75 mmHg (12/25 0810) SpO2:  [96 %-97 %] 96 % (12/25 0810)  Intake/Output from previous day: 12/24 0701 - 12/25 0700 In: 830.7 [I.V.:830.7] Out: 1800 [Urine:1800] Intake/Output this shift: Total I/O In: -  Out: 275 [Urine:275]  Alert oriented appropriate affect speech normal no evidence of drift motor function normal.  Lab Results:  Basename 09/02/11 0430 09/01/11 2145  WBC 8.4 9.4  HGB 15.4 14.4  HCT 45.9 43.0  PLT 200 134*   BMET  Basename 09/02/11 0430 09/01/11 2145 09/01/11 1435  NA 141 -- 140  K 4.4 -- 3.9  CL 107 -- 106  CO2 25 -- 25  GLUCOSE 163* -- 160*  BUN 10 -- 11  CREATININE 1.05 0.91 --  CALCIUM 10.1 -- 9.3    Studies/Results: Ct Head Wo Contrast  09/01/2011  *RADIOLOGY REPORT*  Clinical Data: Seizure  CT HEAD WITHOUT CONTRAST  Technique:  Contiguous axial images were obtained from the base of the skull through the vertex without contrast.  Comparison: None.  Findings: There is an approximately 3 cm x 4 cm hypodensity in the left temporal lobe which extends to the cortical margin of the left temporal operculum (images 16 through 9).  This finding is most suggestive of a cortical infarction but cannot exclude an underlying neoplasm.  There is no intracranial hemorrhage evident. No midline shift or mass effect.  No hydrocephalus.  Paranasal sinuses and mastoid air cells are clear.  Orbits are normal.  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  Cannot exclude underlying neoplasm.  Recommend brain MRI for further evaluation.  Findings discussed with Dr. Deretha Emory on 09/01/2011 at 1415 hours  Original Report Authenticated By: Genevive Bi,  M.D.   Ct Chest W Contrast  09/02/2011  *RADIOLOGY REPORT*  Clinical Data:  Left temporal lobe brain mass.  Evaluate for primary neoplasm.  CT CHEST, ABDOMEN AND PELVIS WITH CONTRAST  Technique:  Multidetector CT imaging of the chest, abdomen and pelvis was performed following the standard protocol during bolus administration of intravenous contrast.  Contrast:  100 ml Omnipaque-300 and oral contrast  Comparison:  None  CT CHEST  Findings:  No evidence of mediastinal or hilar masses.  No lymphadenopathy seen elsewhere within the thorax.  No evidence of chest wall mass.  No evidence of pleural or pericardial effusion.  No suspicious pulmonary nodules or masses are identified.  No evidence of pulmonary infiltrate.  No central endobronchial lesions are identified.  No suspicious bone lesions identified.  IMPRESSION: Negative.  No evidence of mass, lymphadenopathy, or other active disease.  CT ABDOMEN AND PELVIS  Findings:  Numerous fluid attenuation hepatic cysts are seen throughout the right and left lobes.  No definite solid liver masses are identified.  Several small renal cysts are noted bilaterally, however there is no evidence of complex cystic or solid renal masses.  No evidence of hydronephrosis.  Gallbladder sludge noted, however there is no evidence of acute cholecystitis or biliary ductal dilatation.  The pancreas, spleen, and adrenal glands are normal in appearance.  No soft tissue masses or lymphadenopathy seen elsewhere within the abdomen or pelvis.  No evidence of inflammatory process or abnormal fluid collections.  No evidence  of bowel wall thickening or dilatation.  Normal appendix is visualized.  No suspicious bone lesions identified.  IMPRESSION:  1.  No evidence of primary malignancy or metastatic disease. 2.  Numerous hepatic and multiple bilateral small renal cysts. 3.  Probable gallbladder sludge.  No signs of acute cholecystitis or biliary dilatation.  Original Report Authenticated By: Danae Orleans, M.D.   Mr Laqueta Jean Wo Contrast  09/03/2011  *RADIOLOGY REPORT*  Clinical Data:  Brain tumor.  Surgical planning.  MRI HEAD WITHOUT AND WITH CONTRAST  Technique:  Multiplanar, multiecho pulse sequences of the brain and surrounding structures were obtained without and with intravenous contrast.  Contrast: 17mL MULTIHANCE GADOBENATE DIMEGLUMINE 529 MG/ML IV SOLN  Comparison:  MRI 09/01/2011  Findings:  Stealth protocol was performed with thin sections before and after intravenous contrast for 3-D localization in the operating room.  Mass lesion in the left temporal lobe is unchanged from the recent MRI.  Signal characteristics also are unchanged.  The mass involves the posterior and inferior left temporal lobe.  The mass measures 4.1 x 5.4 x 3.2 cm.  There is uplifting of the temporal horn on the left.  The ventricles are not dilated.  The mass does not show any significant enhancement.  No associated hemorrhage is present within the mass.  The mass has ill defined margins and appears infiltrative.  The insula and superior temporal lobe appear spared.  Scattered small T2 hyperintensities in the cerebral white matter do not enhance and are likely related to chronic microvascular ischemia or complex migraine headaches.  No other mass lesions are present.  Paranasal sinuses are clear.  IMPRESSION: Infiltrative nonenhancing mass in the left temporal lobe unchanged from the  prior MRI.  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  Original Report Authenticated By: Camelia Phenes, M.D.   Mr Laqueta Jean RU Contrast  09/02/2011  **ADDENDUM** CREATED: 09/02/2011 08:17:30  Study reviewed in person with Dr. Thana Farr at 0815 hours on 09/02/2011.  **END ADDENDUM** SIGNED BY: Harley Hallmark, M.D.   09/02/2011  *RADIOLOGY REPORT*  Clinical Data: 45 year old male with episode of altered mental status followed by seizure.  Abnormal head CT.  MRI HEAD WITHOUT AND WITH CONTRAST  Technique:  Multiplanar,  multiecho pulse sequences of the brain and surrounding structures were obtained according to standard protocol without and with intravenous contrast  Contrast: 15mL MULTIHANCE GADOBENATE DIMEGLUMINE 529 MG/ML IV SOLN  Comparison: Head CT without contrast 09/01/2011.  Findings: Abnormal left temporal lobe as seen on the earlier CT demonstrates mass-like T2 and FLAIR hyperintensity with distortion of the left mesial temporal lobe structures as well as the fusiform gyrus inferior temporal gyrus and middle temporal gyrus.  The superior temporal gyrus is spared.  Abnormal T2 and FLAIR hyperintensity tracks into the temporal lobe white matter and temporal stem (series 10 image 13). The abnormal area encompasses 49 x 37 x 47 mm (AP by CC by transverse).  There is no dark T2 or MR capsule.  Diffusion is facilitated throughout the area.  No hemorrhage or mineralization is evident.  There is mass effect on the left temporal horn without trapping or ventriculomegaly.  There is questionable petechial tight postcontrast enhancement centrally within the lesion (series 12 image 24) versus intrinsic T1 signal.  No other brain lesion is identified.  There is no significant mass effect.  No midline shift. No restricted diffusion to suggest acute infarction.  Negative pituitary, cervicomedullary junction and visualized cervical  spine. No extra-axial collection.  Major intracranial vascular flow voids are preserved.  Gray-white matter signal outside of the left temporal lobe is within normal limits for age; with occasional cerebral white matter foci of T2 and FLAIR hyperintensity air noted. Bone marrow signal is within normal limits.  Visualized paranasal sinuses and mastoids are clear.  Visualized orbit soft tissues are within normal limits.  Negative scalp soft tissues.  IMPRESSION: Abnormal left temporal lobe.  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 Abnormality encompasses 49 x 37  x 47 mm. Cerebritis or other inflammatory process is a less likely consideration.  There is no abscess formation.  Original Report Authenticated By: Harley Hallmark, M.D.   Ct Abdomen Pelvis W Contrast  09/02/2011  *RADIOLOGY REPORT*  Clinical Data:  Left temporal lobe brain mass.  Evaluate for primary neoplasm.  CT CHEST, ABDOMEN AND PELVIS WITH CONTRAST  Technique:  Multidetector CT imaging of the chest, abdomen and pelvis was performed following the standard protocol during bolus administration of intravenous contrast.  Contrast:  100 ml Omnipaque-300 and oral contrast  Comparison:  None  CT CHEST  Findings:  No evidence of mediastinal or hilar masses.  No lymphadenopathy seen elsewhere within the thorax.  No evidence of chest wall mass.  No evidence of pleural or pericardial effusion.  No suspicious pulmonary nodules or masses are identified.  No evidence of pulmonary infiltrate.  No central endobronchial lesions are identified.  No suspicious bone lesions identified.  IMPRESSION: Negative.  No evidence of mass, lymphadenopathy, or other active disease.  CT ABDOMEN AND PELVIS  Findings:  Numerous fluid attenuation hepatic cysts are seen throughout the right and left lobes.  No definite solid liver masses are identified.  Several small renal cysts are noted bilaterally, however there is no evidence of complex cystic or solid renal masses.  No evidence of hydronephrosis.  Gallbladder sludge noted, however there is no evidence of acute cholecystitis or biliary ductal dilatation.  The pancreas, spleen, and adrenal glands are normal in appearance.  No soft tissue masses or lymphadenopathy seen elsewhere within the abdomen or pelvis.  No evidence of inflammatory process or abnormal fluid collections.  No evidence of bowel wall thickening or dilatation.  Normal appendix is visualized.  No suspicious bone lesions identified.  IMPRESSION:  1.  No evidence of primary malignancy or metastatic disease. 2.  Numerous  hepatic and multiple bilateral small renal cysts. 3.  Probable gallbladder sludge.  No signs of acute cholecystitis or biliary dilatation.  Original Report Authenticated By: Danae Orleans, M.D.    Assessment/Plan: First surgical biopsy of left temporal lesion.  LOS: 2 days  Unable to ascertain exact time of surgery. On the add-on list. Questions answered.   Camesha Farooq J 09/03/2011, 12:26 PM

## 2011-09-04 ENCOUNTER — Inpatient Hospital Stay (HOSPITAL_COMMUNITY): Payer: BC Managed Care – PPO | Admitting: Anesthesiology

## 2011-09-04 ENCOUNTER — Encounter (HOSPITAL_COMMUNITY): Payer: Self-pay | Admitting: Anesthesiology

## 2011-09-04 ENCOUNTER — Other Ambulatory Visit: Payer: Self-pay | Admitting: Neurosurgery

## 2011-09-04 ENCOUNTER — Encounter (HOSPITAL_COMMUNITY)
Admission: EM | Disposition: A | Discharge status: Home / Self Care | Payer: Self-pay | Source: Home / Self Care | Attending: Internal Medicine

## 2011-09-04 HISTORY — PX: CRANIOTOMY: SHX93

## 2011-09-04 LAB — TYPE AND SCREEN
ABO/RH(D): B POS
Antibody Screen: NEGATIVE

## 2011-09-04 LAB — ABO/RH: ABO/RH(D): B POS

## 2011-09-04 SURGERY — CRANIOTOMY TUMOR EXCISION
Anesthesia: General | Site: Head | Laterality: Left | Wound class: Clean

## 2011-09-04 MED ORDER — ROCURONIUM BROMIDE 100 MG/10ML IV SOLN
INTRAVENOUS | Status: DC | PRN
  Administered 2011-09-04: 10 mg via INTRAVENOUS
  Administered 2011-09-04: 50 mg via INTRAVENOUS

## 2011-09-04 MED ORDER — HYDROCODONE-ACETAMINOPHEN 5-325 MG PO TABS
1.0000 | ORAL_TABLET | ORAL | Status: DC | PRN
  Administered 2011-09-05 (×2): 1 via ORAL
  Filled 2011-09-04: qty 1
  Filled 2011-09-04: qty 2

## 2011-09-04 MED ORDER — FENTANYL CITRATE 0.05 MG/ML IJ SOLN: 25.0000 ug | INTRAMUSCULAR | Status: DC | PRN

## 2011-09-04 MED ORDER — SODIUM CHLORIDE 0.9 % IV SOLN
INTRAVENOUS | Status: DC
  Administered 2011-09-04: 18:00:00 via INTRAVENOUS

## 2011-09-04 MED ORDER — PROMETHAZINE HCL 25 MG/ML IJ SOLN: 6.2500 mg | INTRAMUSCULAR | Status: DC | PRN

## 2011-09-04 MED ORDER — DEXTROSE-NACL 5-0.9 % IV SOLN
INTRAVENOUS | Status: DC
  Administered 2011-09-04: 20:00:00 via INTRAVENOUS

## 2011-09-04 MED ORDER — PANTOPRAZOLE SODIUM 40 MG PO TBEC
40.0000 mg | DELAYED_RELEASE_TABLET | Freq: Every day | ORAL | Status: DC
  Administered 2011-09-05 – 2011-09-06 (×2): 40 mg via ORAL
  Filled 2011-09-04 (×3): qty 1

## 2011-09-04 MED ORDER — ONDANSETRON HCL 4 MG/2ML IJ SOLN
INTRAMUSCULAR | Status: DC | PRN
  Administered 2011-09-04: 4 mg via INTRAVENOUS

## 2011-09-04 MED ORDER — NEOSTIGMINE METHYLSULFATE 1 MG/ML IJ SOLN
INTRAMUSCULAR | Status: DC | PRN
  Administered 2011-09-04: 5 mg via INTRAVENOUS

## 2011-09-04 MED ORDER — SODIUM CHLORIDE 0.9 % IV SOLN
INTRAVENOUS | Status: DC | PRN
  Administered 2011-09-04: 15:00:00 via INTRAVENOUS

## 2011-09-04 MED ORDER — GLYCOPYRROLATE 0.2 MG/ML IJ SOLN
INTRAMUSCULAR | Status: DC | PRN
  Administered 2011-09-04: .8 mg via INTRAVENOUS

## 2011-09-04 MED ORDER — SODIUM CHLORIDE 0.9 % IR SOLN
Status: DC | PRN
  Administered 2011-09-04: 1000 mL

## 2011-09-04 MED ORDER — PROPOFOL 10 MG/ML IV EMUL
INTRAVENOUS | Status: DC | PRN
  Administered 2011-09-04: 50 mg via INTRAVENOUS
  Administered 2011-09-04: 200 mg via INTRAVENOUS
  Administered 2011-09-04: 50 mg via INTRAVENOUS

## 2011-09-04 MED ORDER — THROMBIN 20000 UNITS EX KIT
PACK | CUTANEOUS | Status: DC | PRN
  Administered 2011-09-04: 20000 [IU] via TOPICAL

## 2011-09-04 MED ORDER — CEFAZOLIN SODIUM-DEXTROSE 2-3 GM-% IV SOLR
INTRAVENOUS | Status: AC
  Filled 2011-09-04: qty 50

## 2011-09-04 MED ORDER — CEFAZOLIN SODIUM 1 G IJ SOLR
1.0000 g | Freq: Three times a day (TID) | INTRAMUSCULAR | Status: DC
  Filled 2011-09-04 (×2): qty 10

## 2011-09-04 MED ORDER — SUFENTANIL CITRATE 50 MCG/ML IV SOLN
INTRAVENOUS | Status: DC | PRN
  Administered 2011-09-04: 30 ug via INTRAVENOUS

## 2011-09-04 MED ORDER — HEMOSTATIC AGENTS (NO CHARGE) OPTIME
TOPICAL | Status: DC | PRN
  Administered 2011-09-04: 1 via TOPICAL

## 2011-09-04 MED ORDER — BUPIVACAINE-EPINEPHRINE 0.5% -1:200000 IJ SOLN
INTRAMUSCULAR | Status: DC | PRN
  Administered 2011-09-04: 10 mL

## 2011-09-04 MED ORDER — CEFAZOLIN SODIUM 1-5 GM-% IV SOLN
INTRAVENOUS | Status: DC | PRN
  Administered 2011-09-04: 2 g via INTRAVENOUS

## 2011-09-04 MED ORDER — CEFAZOLIN SODIUM 1-5 GM-% IV SOLN
1.0000 g | Freq: Three times a day (TID) | INTRAVENOUS | Status: AC
  Administered 2011-09-04 – 2011-09-05 (×2): 1 g via INTRAVENOUS
  Filled 2011-09-04 (×2): qty 50

## 2011-09-04 SURGICAL SUPPLY — 78 items
BANDAGE GAUZE ELAST BULKY 4 IN (GAUZE/BANDAGES/DRESSINGS) IMPLANT
BIT DRILL WIRE PASS 1.3MM (BIT) IMPLANT
BLADE ULTRA TIP 2M (BLADE) ×2 IMPLANT
BRUSH SCRUB EZ 1% IODOPHOR (MISCELLANEOUS) ×2 IMPLANT
BUR ACORN 6.0 PRECISION (BURR) ×2 IMPLANT
BUR ADDG 1.1 (BURR) IMPLANT
BUR MATCHSTICK NEURO 3.0 LAGG (BURR) IMPLANT
BUR ROUTER D-58 CRANI (BURR) ×2 IMPLANT
CANISTER SUCTION 2500CC (MISCELLANEOUS) ×2 IMPLANT
CLIP TI MEDIUM 6 (CLIP) IMPLANT
CLOTH BEACON ORANGE TIMEOUT ST (SAFETY) ×2 IMPLANT
CONT SPEC 4OZ CLIKSEAL STRL BL (MISCELLANEOUS) ×4 IMPLANT
CORDS BIPOLAR (ELECTRODE) ×2 IMPLANT
COVER TABLE BACK 60X90 (DRAPES) ×2 IMPLANT
DECANTER SPIKE VIAL GLASS SM (MISCELLANEOUS) ×2 IMPLANT
DERMABOND ADVANCED (GAUZE/BANDAGES/DRESSINGS) ×1
DERMABOND ADVANCED .7 DNX12 (GAUZE/BANDAGES/DRESSINGS) ×1 IMPLANT
DRAIN SNY WOU 7FLT (WOUND CARE) IMPLANT
DRAIN SUBARACHNOID (WOUND CARE) IMPLANT
DRAPE CAMERA VIDEO/LASER (DRAPES) IMPLANT
DRAPE LONG LASER MIC (DRAPES) IMPLANT
DRAPE MICROSCOPE LEICA (MISCELLANEOUS) IMPLANT
DRAPE NEUROLOGICAL W/INCISE (DRAPES) ×2 IMPLANT
DRAPE SURG IRRIG POUCH 19X23 (DRAPES) IMPLANT
DRAPE WARM FLUID 44X44 (DRAPE) ×2 IMPLANT
DRILL WIRE PASS 1.3MM (BIT)
DURAFORM COLLAGEN 1X1 5-PACK (Neuro Prosthesis/Implant) ×2 IMPLANT
DURAPREP 6ML APPLICATOR 50/CS (WOUND CARE) ×2 IMPLANT
ELECT CAUTERY BLADE 6.4 (BLADE) ×2 IMPLANT
ELECT REM PT RETURN 9FT ADLT (ELECTROSURGICAL) ×2
ELECTRODE REM PT RTRN 9FT ADLT (ELECTROSURGICAL) ×1 IMPLANT
EVACUATOR 1/8 PVC DRAIN (DRAIN) IMPLANT
EVACUATOR SILICONE 100CC (DRAIN) IMPLANT
GAUZE SPONGE 4X4 16PLY XRAY LF (GAUZE/BANDAGES/DRESSINGS) IMPLANT
GLOVE BIO SURGEON STRL SZ 6.5 (GLOVE) ×6 IMPLANT
GLOVE BIOGEL M 8.0 STRL (GLOVE) ×2 IMPLANT
GLOVE ECLIPSE 6.5 STRL STRAW (GLOVE) ×2 IMPLANT
GLOVE ECLIPSE 7.5 STRL STRAW (GLOVE) ×4 IMPLANT
GLOVE EXAM NITRILE LRG STRL (GLOVE) IMPLANT
GLOVE EXAM NITRILE MD LF STRL (GLOVE) ×2 IMPLANT
GLOVE EXAM NITRILE XL STR (GLOVE) IMPLANT
GLOVE EXAM NITRILE XS STR PU (GLOVE) IMPLANT
GLOVE INDICATOR 7.5 STRL GRN (GLOVE) ×2 IMPLANT
GOWN BRE IMP SLV AUR LG STRL (GOWN DISPOSABLE) ×6 IMPLANT
GOWN BRE IMP SLV AUR XL STRL (GOWN DISPOSABLE) ×2 IMPLANT
GOWN STRL REIN 2XL LVL4 (GOWN DISPOSABLE) IMPLANT
HEMOSTAT SURGICEL 2X14 (HEMOSTASIS) IMPLANT
HOOK DURA (MISCELLANEOUS) ×2 IMPLANT
KIT BASIN OR (CUSTOM PROCEDURE TRAY) ×2 IMPLANT
KIT ROOM TURNOVER OR (KITS) ×2 IMPLANT
NEEDLE HYPO 25X1 1.5 SAFETY (NEEDLE) ×2 IMPLANT
NS IRRIG 1000ML POUR BTL (IV SOLUTION) ×2 IMPLANT
PACK CRANIOTOMY (CUSTOM PROCEDURE TRAY) ×2 IMPLANT
PAD EYE OVAL STERILE LF (GAUZE/BANDAGES/DRESSINGS) IMPLANT
PATTIES SURGICAL .25X.25 (GAUZE/BANDAGES/DRESSINGS) IMPLANT
PATTIES SURGICAL .5 X.5 (GAUZE/BANDAGES/DRESSINGS) IMPLANT
PATTIES SURGICAL .5 X3 (DISPOSABLE) IMPLANT
PATTIES SURGICAL 1/4 X 3 (GAUZE/BANDAGES/DRESSINGS) IMPLANT
PATTIES SURGICAL 1X1 (DISPOSABLE) IMPLANT
PLATE 1.5/0.5 22MM BURR HO (Plate) ×2 IMPLANT
RUBBERBAND STERILE (MISCELLANEOUS) IMPLANT
SCREW SELF DRILL HT 1.5/4MM (Screw) ×12 IMPLANT
SPONGE GAUZE 4X4 12PLY (GAUZE/BANDAGES/DRESSINGS) ×2 IMPLANT
SPONGE NEURO XRAY DETECT 1X3 (DISPOSABLE) IMPLANT
SPONGE SURGIFOAM ABS GEL 100 (HEMOSTASIS) ×2 IMPLANT
STAPLER SKIN PROX WIDE 3.9 (STAPLE) ×2 IMPLANT
STRIP CLOSURE SKIN 1/2X4 (GAUZE/BANDAGES/DRESSINGS) ×2 IMPLANT
SUT NURALON 4 0 TR CR/8 (SUTURE) ×4 IMPLANT
SUT SILK 0 TIES 10X30 (SUTURE) IMPLANT
SUT VIC AB 2-0 CP2 18 (SUTURE) ×4 IMPLANT
SYR 20ML ECCENTRIC (SYRINGE) ×2 IMPLANT
TIP SONASTAR STD MISONIX 1.9 (TRAY / TRAY PROCEDURE) IMPLANT
TOWEL OR 17X24 6PK STRL BLUE (TOWEL DISPOSABLE) ×2 IMPLANT
TOWEL OR 17X26 10 PK STRL BLUE (TOWEL DISPOSABLE) ×2 IMPLANT
TRAY FOLEY CATH 14FRSI W/METER (CATHETERS) ×2 IMPLANT
TUBE CONNECTING 12X1/4 (SUCTIONS) ×2 IMPLANT
UNDERPAD 30X30 INCONTINENT (UNDERPADS AND DIAPERS) ×2 IMPLANT
WATER STERILE IRR 1000ML POUR (IV SOLUTION) ×2 IMPLANT

## 2011-09-04 NOTE — Progress Notes (Signed)
Patient sent to OR with family. VVS, consent signed. Family at bedside.

## 2011-09-04 NOTE — Anesthesia Preprocedure Evaluation (Addendum)
Anesthesia Evaluation  Patient identified by MRN, date of birth, ID band Patient awake    Reviewed: Allergy & Precautions, H&P , NPO status , Patient's Chart, lab work & pertinent test results  Airway Mallampati: I TM Distance: >3 FB Neck ROM: Full    Dental No notable dental hx. (+) Teeth Intact and Dental Advisory Given   Pulmonary neg pulmonary ROS,  clear to auscultation  Pulmonary exam normal       Cardiovascular neg cardio ROS Regular Normal    Neuro/Psych Seizures - (on Dilantin and steroids since Sunday, has temporal lobe mass),  tumor Negative Psych ROS   GI/Hepatic negative GI ROS, Neg liver ROS,   Endo/Other  Negative Endocrine ROS  Renal/GU negative Renal ROS  Genitourinary negative   Musculoskeletal negative musculoskeletal ROS (+)   Abdominal   Peds  Hematology negative hematology ROS (+)   Anesthesia Other Findings   Reproductive/Obstetrics                          Anesthesia Physical Anesthesia Plan  ASA: III  Anesthesia Plan: General   Post-op Pain Management:    Induction: Intravenous  Airway Management Planned: Oral ETT  Additional Equipment: Arterial line  Intra-op Plan:   Post-operative Plan: Extubation in OR  Informed Consent: I have reviewed the patients History and Physical, chart, labs and discussed the procedure including the risks, benefits and alternatives for the proposed anesthesia with the patient or authorized representative who has indicated his/her understanding and acceptance.   Dental advisory given  Plan Discussed with: CRNA, Anesthesiologist and Surgeon  Anesthesia Plan Comments: (Plan routine monitors, A line, GETA)       Anesthesia Quick Evaluation

## 2011-09-04 NOTE — Progress Notes (Signed)
Subjective: The patient is examined with his wife at the bedside.  He has no specific complaints at the present time.  He denies fevers chills nausea vomiting shortness of breath or chest pain.  There has been no recurrent seizure activity.  Objective: Weight change:   Intake/Output Summary (Last 24 hours) at 09/04/11 1155 Last data filed at 09/04/11 0600  Gross per 24 hour  Intake    880 ml  Output    950 ml  Net    -70 ml   Blood pressure 117/76, pulse 75, temperature 98.4 F (36.9 C), temperature source Oral, resp. rate 19, height 5\' 10"  (1.778 m), weight 80.5 kg (177 lb 7.5 oz), SpO2 94.00%.  Physical Exam: General: No acute respiratory distress Lungs: Clear to auscultation bilaterally without wheezes or crackles Cardiovascular: Regular rate and rhythm without murmur gallop or rub normal S1 and S2 Abdomen: Nontender, nondistended, soft, bowel sounds positive, no rebound, no ascites, no appreciable mass Extremities: No significant cyanosis, clubbing, or edema bilateral lower extremities  Lab Results:  [facility] 09/02/11 0430 09/01/11 2145 09/01/11 1435  NA 141 -- 140  K 4.4 -- 3.9  CL 107 -- 106  CO2 25 -- 25  GLUCOSE 163* -- 160*  BUN 10 -- 11  CREATININE 1.05 0.91 1.03  CALCIUM 10.1 -- 9.3  MG -- 2.2 --  PHOS -- -- --    Basename 09/02/11 0430 09/01/11 1435  AST 23 17  ALT 18 17  ALKPHOS 71 63  BILITOT 0.3 0.2*  PROT 7.2 6.6  ALBUMIN 4.1 3.9    Basename 09/02/11 0430 09/01/11 2145 09/01/11 1435  WBC 8.4 9.4 12.4*  NEUTROABS -- -- --  HGB 15.4 14.4 14.7  HCT 45.9 43.0 43.5  MCV 85.6 85.1 85.1  PLT 200 134* 147*   Micro Results: Recent Results (from the past 240 hour(s))  MRSA PCR SCREENING     Status: Normal   Collection Time   09/01/11  9:20 PM      Component Value Range Status Comment   MRSA by PCR NEGATIVE  NEGATIVE  Final    Assessment/Plan:  49 x 37 x 47 mm left temporal lobe mass Newly diagnosed-felt to most likely represent a low-grade  glioma-neurosurgery plans to perform an open biopsy today  New seizure activity Due to above finding-no recurrent seizure activity with steroids plus anti-epileptic medications-neurology following  Situational anxiety/depression Well-controlled with current management   LOS: 3 days  09/04/2011, 11:55 AM  Lonia Blood, MD [facility] Office  [phone] Pager [phone]  On-Call/Text Page:      [email or web address]      password [facility]

## 2011-09-04 NOTE — Anesthesia Procedure Notes (Signed)
Procedure Name: Intubation Date/Time: 09/04/2011 3:19 PM Performed by: Glendora Score Pre-anesthesia Checklist: Patient identified, Emergency Drugs available, Suction available and Patient being monitored Patient Re-evaluated:Patient Re-evaluated prior to inductionOxygen Delivery Method: Circle System Utilized Preoxygenation: Pre-oxygenation with 100% oxygen Intubation Type: IV induction Ventilation: Mask ventilation without difficulty and Oral airway inserted - appropriate to patient size Laryngoscope Size: Hyacinth Meeker and 2 Grade View: Grade I Tube type: Oral Number of attempts: 1 Airway Equipment and Method: stylet Placement Confirmation: ETT inserted through vocal cords under direct vision,  positive ETCO2 and breath sounds checked- equal and bilateral Secured at: 23 cm Tube secured with: Tape Dental Injury: Teeth and Oropharynx as per pre-operative assessment

## 2011-09-04 NOTE — Progress Notes (Addendum)
MEDICATION RELATED CONSULT NOTE - Follow UP Pharmacy Consult:  Dilantin Indication:  seizure  No Known Allergies  Patient Measurements: Height: 5\' 10"  (177.8 cm) Weight: 177 lb 7.5 oz (80.5 kg) IBW/kg (Calculated) : 73   Vital Signs: Temp: 98.4 F (36.9 C) (12/26 0900) Temp src: Oral (12/26 0900) BP: 117/76 mmHg (12/26 0900) Pulse Rate: 75  (12/26 0900)  Labs: Results for MAE, CIANCI (MRN [id number]) as of 09/04/2011 11:26  Ref. Range 09/01/2011 21:45 09/02/2011 04:30  Phenytoin Lvl Latest Range: 10.0-20.0 ug/mL 13.6 14.8   Medical History: Past Medical History  Diagnosis Date  . Depression   . Skin cancer     Assessment:  45 y.o. male who presents after having had a seizure  Pharmacist System-Based Medication Review:  Anticoagulation:  SCD's/SQ Heparin Infectious Disease:  No s/s infection - no ABX on board. Cardiovascular:  No significant hx, VSS, on Lovaza Endocrinology:  No hx. of DM/Hypothyroidism Gastrointestinal / Nutrition:  NPO Neurology:  Hx depression. 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  Dilantin on board for seizure prevention Nephrology:  SCr 1.03, CrCL 92 ml/min, lytes WNL Pulmonary:  RA Hematology / Oncology;  Hx skin cancer left upper chest 1988.  H/H WNL, plts slightly low 147 Best Practices:  Heparin SQ  Goal of Therapy:  Corrected dilantin level 10-20 mcg/mL  Plan:  1.  Continue dilantin 100mg  IV Q8H as ordered by Neuro - level on 12/24 (14.8 mcg/ml - not at steady state) 2.  F/U dilantin level post load and in AM 3.  Dilantin level at steady-state (5-7 days on maintenance regimen).   Nadara Mustard, PharmD., MS Clinical Pharmacist Pager [phone] 09/04/2011,11:29 AM

## 2011-09-04 NOTE — Anesthesia Postprocedure Evaluation (Signed)
  Anesthesia Post-op Note  Patient: Nathan Prince  Procedure(s) Performed:  CRANIOTOMY TUMOR EXCISION - Left Temporal craniectomy for tumor  Patient Location: PACU  Anesthesia Type: General  Level of Consciousness: awake and alert   Airway and Oxygen Therapy: Patient Spontanous Breathing and Patient connected to nasal cannula oxygen  Post-op Pain: none  Post-op Assessment: Post-op Vital signs reviewed, Patient's Cardiovascular Status Stable, Respiratory Function Stable, Patent Airway, No signs of Nausea or vomiting and Pain level controlled  Post-op Vital Signs: Reviewed and stable  Complications: No apparent anesthesia complications

## 2011-09-04 NOTE — Transfer of Care (Signed)
Immediate Anesthesia Transfer of Care Note  Patient: Nathan Prince  Procedure(s) Performed:  CRANIOTOMY TUMOR EXCISION - Left Temporal craniectomy for tumor  Patient Location: PACU  Anesthesia Type: General  Level of Consciousness: awake and patient cooperative  Airway & Oxygen Therapy: Patient Spontanous Breathing and Patient connected to face mask oxygen  Post-op Assessment: Report given to PACU RN  Post vital signs: Reviewed and stable  Complications: No apparent anesthesia complications

## 2011-09-04 NOTE — Consults (Signed)
Subjective: Patient is doing well. Reports less anxiety.   Objective: Vital signs in last 24 hours: Temp:  [97.6 F (36.4 C)-98.9 F (37.2 C)] 98.9 F (37.2 C) (12/26 0307) Pulse Rate:  [68-87] 70  (12/26 0307) Resp:  [15-21] 15  (12/26 0307) BP: (119-129)/(70-84) 127/84 mmHg (12/26 0307) SpO2:  [94 %-100 %] 100 % (12/26 0307)  Intake/Output from previous day: 12/25 0701 - 12/26 0700 In: 1420 [P.O.:1260; I.V.:160] Out: 1225 [Urine:1225]   Nutritional status: General  Neurological exam: AAO*3. Mild expressive aphasia, word-finding difficulty. Was able to tell me months of the year forwards and backwards correctly, exhibiting good attention span. Followed complex commands. Cranial nerves: EOMI, PERRL. Visual fields were full. Sensation to V1 through V3 areas of the face was intact and symmetric throughout. There was no facial asymmetry. Shoulder shrug was 5/5 and symmetric bilaterally. Head rotation was 5/5 bilaterally. There was no dysarthria or palatal deviation. Motor: strength was 5/5 and symmetric throughout except distal RLE with strength of 5-/5 in plantar and dorsiflexion. Sensory: was intact throughout to light touch, pinprick, vibration and proprioception. Coordination: finger-to-nose and heel-to-shin were intact and symmetric bilaterally. Reflexes: were 2+ in upper extremities and 1+ at the knees and 1+ at the ankles. Plantar response was downgoing bilaterally. Gait: deferred  Lab Results:  Basename 09/02/11 0430 09/01/11 2145 09/01/11 1435  WBC 8.4 9.4 --  HGB 15.4 14.4 --  HCT 45.9 43.0 --  PLT 200 134* --  NA 141 -- 140  K 4.4 -- 3.9  CL 107 -- 106  CO2 25 -- 25  GLUCOSE 163* -- 160*  BUN 10 -- 11  CREATININE 1.05 0.91 --  CALCIUM 10.1 -- 9.3  LABA1C -- -- --   Studies/Results: Ct Chest W Contrast  09/02/2011  *RADIOLOGY REPORT*  Clinical Data:  Left temporal lobe brain mass.  Evaluate for primary neoplasm.  CT CHEST, ABDOMEN AND PELVIS WITH CONTRAST  Technique:   Multidetector CT imaging of the chest, abdomen and pelvis was performed following the standard protocol during bolus administration of intravenous contrast.  Contrast:  100 ml Omnipaque-300 and oral contrast  Comparison:  None  CT CHEST  Findings:  No evidence of mediastinal or hilar masses.  No lymphadenopathy seen elsewhere within the thorax.  No evidence of chest wall mass.  No evidence of pleural or pericardial effusion.  No suspicious pulmonary nodules or masses are identified.  No evidence of pulmonary infiltrate.  No central endobronchial lesions are identified.  No suspicious bone lesions identified.  IMPRESSION: Negative.  No evidence of mass, lymphadenopathy, or other active disease.  CT ABDOMEN AND PELVIS  Findings:  Numerous fluid attenuation hepatic cysts are seen throughout the right and left lobes.  No definite solid liver masses are identified.  Several small renal cysts are noted bilaterally, however there is no evidence of complex cystic or solid renal masses.  No evidence of hydronephrosis.  Gallbladder sludge noted, however there is no evidence of acute cholecystitis or biliary ductal dilatation.  The pancreas, spleen, and adrenal glands are normal in appearance.  No soft tissue masses or lymphadenopathy seen elsewhere within the abdomen or pelvis.  No evidence of inflammatory process or abnormal fluid collections.  No evidence of bowel wall thickening or dilatation.  Normal appendix is visualized.  No suspicious bone lesions identified.  IMPRESSION:  1.  No evidence of primary malignancy or metastatic disease. 2.  Numerous hepatic and multiple bilateral small renal cysts. 3.  Probable gallbladder sludge.  No  signs of acute cholecystitis or biliary dilatation.  Original Report Authenticated By: Danae Orleans, M.D.   Mr Laqueta Jean Wo Contrast  09/03/2011  *RADIOLOGY REPORT*  Clinical Data:  Brain tumor.  Surgical planning.  MRI HEAD WITHOUT AND WITH CONTRAST  Technique:  Multiplanar, multiecho  pulse sequences of the brain and surrounding structures were obtained without and with intravenous contrast.  Contrast: 17mL MULTIHANCE GADOBENATE DIMEGLUMINE 529 MG/ML IV SOLN  Comparison:  MRI 09/01/2011  Findings:  Stealth protocol was performed with thin sections before and after intravenous contrast for 3-D localization in the operating room.  Mass lesion in the left temporal lobe is unchanged from the recent MRI.  Signal characteristics also are unchanged.  The mass involves the posterior and inferior left temporal lobe.  The mass measures 4.1 x 5.4 x 3.2 cm.  There is uplifting of the temporal horn on the left.  The ventricles are not dilated.  The mass does not show any significant enhancement.  No associated hemorrhage is present within the mass.  The mass has ill defined margins and appears infiltrative.  The insula and superior temporal lobe appear spared.  Scattered small T2 hyperintensities in the cerebral white matter do not enhance and are likely related to chronic microvascular ischemia or complex migraine headaches.  No other mass lesions are present.  Paranasal sinuses are clear.  IMPRESSION: Infiltrative nonenhancing mass in the left temporal lobe unchanged from the  prior MRI.  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  Original Report Authenticated By: Camelia Phenes, M.D.   Ct Abdomen Pelvis W Contrast  09/02/2011  *RADIOLOGY REPORT*  Clinical Data:  Left temporal lobe brain mass.  Evaluate for primary neoplasm.  CT CHEST, ABDOMEN AND PELVIS WITH CONTRAST  Technique:  Multidetector CT imaging of the chest, abdomen and pelvis was performed following the standard protocol during bolus administration of intravenous contrast.  Contrast:  100 ml Omnipaque-300 and oral contrast  Comparison:  None  CT CHEST  Findings:  No evidence of mediastinal or hilar masses.  No lymphadenopathy seen elsewhere within the thorax.  No evidence of chest wall mass.  No evidence of pleural or  pericardial effusion.  No suspicious pulmonary nodules or masses are identified.  No evidence of pulmonary infiltrate.  No central endobronchial lesions are identified.  No suspicious bone lesions identified.  IMPRESSION: Negative.  No evidence of mass, lymphadenopathy, or other active disease.  CT ABDOMEN AND PELVIS  Findings:  Numerous fluid attenuation hepatic cysts are seen throughout the right and left lobes.  No definite solid liver masses are identified.  Several small renal cysts are noted bilaterally, however there is no evidence of complex cystic or solid renal masses.  No evidence of hydronephrosis.  Gallbladder sludge noted, however there is no evidence of acute cholecystitis or biliary ductal dilatation.  The pancreas, spleen, and adrenal glands are normal in appearance.  No soft tissue masses or lymphadenopathy seen elsewhere within the abdomen or pelvis.  No evidence of inflammatory process or abnormal fluid collections.  No evidence of bowel wall thickening or dilatation.  Normal appendix is visualized.  No suspicious bone lesions identified.  IMPRESSION:  1.  No evidence of primary malignancy or metastatic disease. 2.  Numerous hepatic and multiple bilateral small renal cysts. 3.  Probable gallbladder sludge.  No signs of acute cholecystitis or biliary dilatation.  Original Report Authenticated By: Danae Orleans, M.D.   Medications: I have reviewed the patient's current medications.  Assessment/Plan: 1) Cont. Dilantin 2) Cont. Decadron 3) For biopsy of the mass hopefully today 4) Will follow  LOS: 3 days   Kember Boch

## 2011-09-04 NOTE — Preoperative (Signed)
Beta Blockers   Reason not to administer Beta Blockers:Not Applicable 

## 2011-09-04 NOTE — Progress Notes (Signed)
Patient ID: Nathan Prince, male   DOB: [birth year], 45 y.o.   MRN: [id number] Stable. No weakness. Speech normal. Plan dc a line.spoke with wife a lot of family members about frozen section and the need to wait for permanent path report

## 2011-09-04 NOTE — Progress Notes (Signed)
Patient ID: Nathan Prince, male   DOB: 04-16-1966, 45 y.o.   MRN: [id number] Patient for left craniotomy with stealth DR CABBELL TO HELP WITH THE PROCEDURE .Soke with him and wife.

## 2011-09-05 ENCOUNTER — Encounter (HOSPITAL_COMMUNITY): Payer: Self-pay | Admitting: Neurosurgery

## 2011-09-05 DIAGNOSIS — G939 Disorder of brain, unspecified: Secondary | ICD-10-CM

## 2011-09-05 HISTORY — PX: MASS BIOPSY: SHX5445

## 2011-09-05 LAB — CBC
HCT: 42.1 % (ref 39.0–52.0)
Hemoglobin: 13.7 g/dL (ref 13.0–17.0)
MCHC: 32.5 g/dL (ref 30.0–36.0)
MCV: 85.7 fL (ref 78.0–100.0)
WBC: 11.1 10*3/uL — ABNORMAL HIGH (ref 4.0–10.5)

## 2011-09-05 LAB — BASIC METABOLIC PANEL
BUN: 14 mg/dL (ref 6–23)
CO2: 25 mEq/L (ref 19–32)
Creatinine, Ser: 0.95 mg/dL (ref 0.50–1.35)
GFR calc Af Amer: >90 mL/min (ref >90)
Sodium: 141 mEq/L (ref 135–145)

## 2011-09-05 LAB — PHENYTOIN LEVEL, TOTAL: Phenytoin Lvl: 7.4 ug/mL — ABNORMAL LOW (ref 10.0–20.0)

## 2011-09-05 LAB — ALBUMIN: Albumin: 3.4 g/dL — ABNORMAL LOW (ref 3.5–5.2)

## 2011-09-05 MED ORDER — PHENYTOIN SODIUM EXTENDED 100 MG PO CAPS
300.0000 mg | ORAL_CAPSULE | Freq: Every day | ORAL | Status: DC
  Filled 2011-09-05: qty 3

## 2011-09-05 MED ORDER — PHENYTOIN SODIUM EXTENDED 100 MG PO CAPS
600.0000 mg | ORAL_CAPSULE | Freq: Three times a day (TID) | ORAL | Status: DC
  Filled 2011-09-05 (×3): qty 6

## 2011-09-05 MED ORDER — DEXAMETHASONE 4 MG PO TABS
4.0000 mg | ORAL_TABLET | Freq: Four times a day (QID) | ORAL | Status: DC
  Administered 2011-09-05 – 2011-09-06 (×5): 4 mg via ORAL
  Filled 2011-09-05 (×9): qty 1

## 2011-09-05 MED ORDER — SODIUM CHLORIDE 0.9 % IV SOLN: INTRAVENOUS | Status: DC

## 2011-09-05 MED ORDER — LEVETIRACETAM 500 MG PO TABS
500.0000 mg | ORAL_TABLET | Freq: Two times a day (BID) | ORAL | Status: DC
  Administered 2011-09-05 – 2011-09-06 (×3): 500 mg via ORAL
  Filled 2011-09-05 (×4): qty 1

## 2011-09-05 NOTE — Progress Notes (Signed)
Subjective: The patient is examined in the NICU.  He is resting comfortably with no complaints.  He tolerated his surgery well.  He denies f/c, sob, n/v, or abdom pain.    Objective: Weight change:   Intake/Output Summary (Last 24 hours) at 09/05/11 1108 Last data filed at 09/05/11 1000  Gross per 24 hour  Intake 1759.17 ml  Output   2650 ml  Net -890.83 ml   Blood pressure 112/69, pulse 78, temperature 98.2 F (36.8 C), temperature source Oral, resp. rate 14, height 5\' 10"  (1.778 m), weight 80.5 kg (177 lb 7.5 oz), SpO2 97.00%.  Physical Exam: General: No acute respiratory distress Lungs: Clear to auscultation bilaterally without wheezes or crackles Cardiovascular: Regular rate and rhythm without murmur gallop or rub normal S1 and S2 Abdomen: Nontender, nondistended, soft, bowel sounds positive, no rebound, no ascites, no mass Extremities: No significant cyanosis, clubbing, or edema bilateral lower extremities  Lab Results:  Basename 09/05/11 0402  NA 141  K 4.0  CL 106  CO2 25  GLUCOSE 111*  BUN 14  CREATININE 0.95  CALCIUM 8.8  MG --  PHOS --    Basename 09/05/11 0402  WBC 11.1*  NEUTROABS --  HGB 13.7  HCT 42.1  MCV 85.7  PLT 143*   Micro Results: Recent Results (from the past 240 hour(s))  MRSA PCR SCREENING     Status: Normal   Collection Time   09/01/11  9:20 PM      Component Value Range Status Comment   MRSA by PCR NEGATIVE  NEGATIVE  Final    Assessment/Plan:  49 x 37 x 47 mm left temporal lobe mass Newly diagnosed-now s/p bx with frozen section most c/w glioma/astrocytoma - NS has consulted Onc and Rad Onc - formal path results are pending  New seizure activity Due to above finding-no recurrent seizure activity with steroids plus anti-epileptic medications-neurology following-pharmacy to adjust dilantin as needed   Situational anxiety/depression Well-controlled with current management  Mild hyperglycemia Due to dextrose in IVF + steroids -  is now tolerating oral intake - d/c dextrose IVF and follow   Disposition  Cleared for transfer to 3000 unit by NS - d/c dispo will depend upon Onc/Rad Onc opinions/tx plan   LOS: 4 days  09/05/2011, 11:08 AM  Lonia Blood, MD [facility] Office  [phone] Pager [phone]  On-Call/Text Page:      [email or web address]      password [facility]

## 2011-09-05 NOTE — Progress Notes (Addendum)
MEDICATION RELATED CONSULT NOTE - FOLLOW UP   Pharmacy Consult for Dilantin Indication: Seizures  Admit Complaint: seizures Pharmacist System-Based Medication Review: Anticoagulation:  None PTA, SCDs for VTE px Infectious Disease:  Received Ancef post-op x 2 doses. Afebrile, WBC 11.1.  Cardiovascular:  No significant hx, VSS (PTA on Lovaza) Endocrinology:  No hx of DM/Hypothyroidism Gastrointestinal / Nutrition:  Clear liquids started yesterday. Lytes ok. Neurology:  Hx depression. Post tonic-clonic seizure from home and found to have left temporal mass. Biopsy performed on 12/26, now awaiting results. On dexamethasone, dilantin. Dilantin loaded on 12/23. Multiple levels have been drawn however not yet at steady state. After LD, level corrected to ~15. Most recent level this a.m. corrects to ~9.5. Will recheck trough/albumin tomorrow evening at ~6 days of treatment as this level will reflect more accurate steady state level. At this time we will correct the dose to ensure maintained at goal of 10-20. Will resume MD of 300 mg for now. No new seizure activity has been noted.  Nephrology:  SCr 0.95, CrCl~100 ml/min, lytes WNL Pulmonary:  RA Hematology / Oncology;  Hx skin cancer left upper chest 1988.  H/H WNL, plts slightly low 143 Best Practices:  SCDs for VTE px  Assessment:  45 y.o. M on dilantin for seizure prophylaxis in the setting of left temporal mass and seizures on admission. Dilantin loaded on 12/23. Multiple levels have been drawn however not yet at steady state. After LD, level corrected to ~15. Most recent level yesterday evening likely corrects to ~8 (no new albumin taken). Will recheck trough/albumin tomorrow evening at ~6 days of treatment as this level will reflect more accurate steady state level. At this time we will correct the dose to ensure maintained at goal of 10-20. Will resume maintenance dose of 300 mg for now. No new seizure activity has been noted.  Goal of Therapy:    Corrected dilantin level 10-20 mcg/mL  Plan:  1.  Continue dilantin ER 300 mg hs  2.  Will check dilantin level with albumin on 12/28 evening. 3.  Will continue to monitor for any s/sx of seizure activity  Georgina Pillion, PharmD, BCPS Clinical Pharmacist 09/05/2011 10:42 AM    No Known Allergies  Patient Measurements: Height: 5\' 10"  (177.8 cm) Weight: 177 lb 7.5 oz (80.5 kg) IBW/kg (Calculated) : 73    Vital Signs: Temp: 98.2 F (36.8 C) (12/27 0800) Temp src: Oral (12/27 0800) BP: 112/69 mmHg (12/27 1000) Pulse Rate: 78  (12/27 1000) Intake/Output from previous day: 12/26 0701 - 12/27 0700 In: 1141.7 [P.O.:480; I.V.:561.7; IV Piggyback:100] Out: 1700 [Urine:1700] Intake/Output from this shift: Total I/O In: 617.5 [P.O.:480; I.V.:137.5] Out: 950 [Urine:950]  Labs:  [facility] 09/05/11 0402  WBC 11.1*  HGB 13.7  HCT 42.1  PLT 143*  APTT --  CREATININE 0.95  LABCREA --  CREATININE 0.95  CREAT24HRUR --  MG --  PHOS --  ALBUMIN --  PROT --  ALBUMIN --  AST --  ALT --  ALKPHOS --  BILITOT --  BILIDIR --  IBILI --   Estimated Creatinine Clearance: 99.3 ml/min (by C-G formula based on Cr of 0.95).   Microbiology: Recent Results (from the past 720 hour(s))  MRSA PCR SCREENING     Status: Normal   Collection Time   09/01/11  9:20 PM      Component Value Range Status Comment   MRSA by PCR NEGATIVE  NEGATIVE  Final     Medications:  Prescriptions prior to admission  Medication Sig Dispense Refill  . fish oil-omega-3 fatty acids 1000 MG capsule Take 1 g by mouth daily.        . OMEGA-3 KRILL OIL PO Take 1 capsule by mouth daily.        . Multiple Vitamins-Minerals (MULTIVITAMINS THER. W/MINERALS) TABS Take 1 tablet by mouth daily.         Scheduled:    . ceFAZolin      . ceFAZolin (ANCEF) IV  1 g Intravenous Q8H  . dexamethasone  4 mg Oral Q6H  . multivitamins ther. w/minerals  1 tablet Oral Daily  . pantoprazole  40 mg Oral Q1200  . DISCONTD:  ceFAZolin (ANCEF) IM  1 g Intramuscular Q8H  . DISCONTD: dexamethasone  4 mg Intravenous Q6H  . DISCONTD: heparin  5,000 Units Subcutaneous Q8H  . DISCONTD: omega-3 acid ethyl esters  1 g Oral Daily  . DISCONTD: phenytoin  300 mg Oral QHS  . DISCONTD: phenytoin  600 mg Oral TID

## 2011-09-05 NOTE — Progress Notes (Addendum)
                                       [facility] PRO[facility]ESS [facility]    SUBJECTIVE   No complaints, No seizures over night  OBJECTIVE   Vital signs in last 24 hours: Temp:  [97.7 F (36.5 C)-98.6 F (37 C)] 98.2 F (36.8 C) (12/27 0800) Pulse Rate:  [53-83] 83  (12/27 0900) Resp:  [13-20] 16  (12/27 0900) BP: (102-138)/(62-84) 120/80 mmHg (12/27 0900) SpO2:  [94 %-100 %] 96 % (12/27 0900) Arterial Line BP: (149-161)/(69-80) 161/72 mmHg (12/26 1745)  Intake/Output from previous day: 12/26 0701 - 12/27 0700 In: 1141.7 [P.O.:480; I.V.:561.7; IV Piggyback:100] Out: 1700 [Urine:1700] Intake/Output this shift: Total I/O In: 100 [I.V.:100] Out: 350 [Urine:350] Nutritional status: Clear Liquid  Past Medical History  Diagnosis Date  . Depression   . Skin cancer     Neurologic Exam:   Mental Status: Alert, oriented, thought content appropriate.  Speech fluent without evidence of aphasia. Able to follow 3 step commands without difficulty. Cranial Nerves: II-Visual fields grossly intact. III/IV/VI-Extraocular movements intact.  Pupils reactive bilaterally. V/VII-Smile symmetric VIII-grossly intact IX/X-normal gag XI-bilateral shoulder shrug XII-midline tongue extension Motor: 5/5 bilaterally with normal tone and bulk Sensory: Pinprick and light touch intact throughout, bilaterally Deep Tendon Reflexes: 2+ and symmetric throughout Plantars: Downgoing bilaterally Cerebellar: Normal finger-to-nose, normal rapid alternating movements and normal heel-to-shin test.  Normal gait and station.    Lab Results: No results found for this basename: cbc, bmp, coags, chol, tri, ldl, hga1c   Lipid Panel No results found for this basename: CHOL,TRIG,HDL,CHOLHDL,VLDL,LDLCALC in the last 72 hours  Studies/Results: No results found.  Medications:     Scheduled:   . ceFAZolin      . ceFAZolin (ANCEF) IV  1 g Intravenous Q8H  . dexamethasone  4 mg Oral Q6H  .  multivitamins ther. w/minerals  1 tablet Oral Daily  . pantoprazole  40 mg Oral Q1200  . phenytoin  300 mg Oral QHS  . DISCONTD: ceFAZolin (ANCEF) IM  1 g Intramuscular Q8H  . DISCONTD: dexamethasone  4 mg Intravenous Q6H  . DISCONTD: heparin  5,000 Units Subcutaneous Q8H  . DISCONTD: omega-3 acid ethyl esters  1 g Oral Daily    Assessment/Plan:    Patient Active Hospital Problem List: Seizure (09/01/2011)   Assessment: Patient doing well, His dilantin level is 7.4.  I have ordered pharmacy to follow and make adjustments to his dilantin dosing.  He will need to remain on Dilantin at time of D/C.   Plan: Continue Dilantin,  Neuro S/O  I have discussed patient with Dr. Lyman Speller and they have seen patient and agree with the above mentioned.    Addendum: We will D/C dilantin and change patient to PO 500 mg Keppra BID  I have discussed patient with Dr. Lyman Speller and they have seen patient and agree with the above mentioned.     Felicie Morn PA-C [facility] [phone]  09/05/2011, 9:40 AM

## 2011-09-05 NOTE — Progress Notes (Signed)
Chaplain Note:  Upon referral from pt nurse, chaplain entered pt's room and introduced Nathan Prince.  Pt welcomed chaplain.  Chaplain provided spiritual comfort, support, and prayer for pt.  Chaplain will follow up as requested.   09/05/11 1627  Clinical Encounter Type  Visited With Patient  Visit Type Spiritual support  Referral From Nurse  Spiritual Encounters  Spiritual Needs Emotional;Prayer  Stress Factors  Patient Stress Factors Major life changes;Other (Comment);Health changes (Pt awaiting results of biopsy)  Family Stress Factors Loss of control    Verdie Shire, chaplain [facility] [phone]

## 2011-09-05 NOTE — Op Note (Signed)
[name], [name]                 ACCOUNT NO.:  [id number]  MEDICAL RECORD NO.:  [id number]  LOCATION:  3110                         FACILITY:  [facility]  PHYSICIAN:  [name], M.D.   DATE OF BIRTH:  09/30/1965  DATE OF PROCEDURE:  09/04/2011 DATE OF DISCHARGE:                              OPERATIVE REPORT   PREOPERATIVE DIAGNOSIS:  Left temporal brain tumor.  POSTOPERATIVE DIAGNOSIS:  Left temporal brain tumor.  PROCEDURE:  Left temporal lobe craniotomy with biopsies of the lesion using the stealth protocol.  SURGEON:  [name], MD.  ASSISTANT:  Coletta Memos, MD.  CLINICAL HISTORY:  The patient was admitted after he had 1 episode of seizure associated with headache, and difficulty with speech.  CT and MRI shows that he has a tumor in the left temporal area.  The area is right in the left temporal lobe, located close to posterior to the speech area.  Biopsy was advised.  By MRI, it looks like yellow green astrocytoma.  I talked to him and his wife and talked about the need to get a tissue diagnosis.  The risks were explained including the possibility of infection, weakness, hemorrhage, and be aware that the biopsy only to remove specimens to make a diagnosis  for further treatment__________  DESCRIPTION OF PROCEDURE:  The patient was taken to the OR, and after intubation, 3 pins were applied to the head.  He was turned to the side with the left temporal area up.  Then,with the stealth __________ we went ahead and made the reference point of the skull and this was translated to the computer and back to the patient where we able to select an area where the center of the tumor was located.  Then, the scalp was cleaned with DuraPrep.  Drapes were applied.  An incision about  about 2 inches up was made.  The incision was carried out through the subcutaneous tissue.  The fascia was opened  and temporal  muscle was retracted__________ Then with the craniotome, we made a  small bur hole which was made bigger of approximately of a quarter size.  The dura mater was opened in the same manner.  Then using the Stealth, we were able to locate the area.  The distance of the tumor was at 3.5 cm from the surface.  Then, biopsy was obtained using pituitary forceps and the specimen was sent to frozen.  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 Because of that, we took more specimens and there are going to be more permanent section.  Then, the area was irrigated.  Duragen was left into the area to cover the__________ dura mater.  A single round plate with 6 screws were used to close the craniotomy defect and the skull was _closed with vycril_________ and staples.         ______________________________ [name], M.D.    EB/MEDQ  D:  09/04/2011  T:  09/05/2011  Job:  [id number]

## 2011-09-05 NOTE — Progress Notes (Signed)
Patient ID: Nathan Prince, male   DOB: December 31, 1965, 45 y.o.   MRN: [id number] Stable.no c/o. No weakness. Dilantin level low. Level pending. Oncologist and radiation therapy called to consult on hum.to 3000.

## 2011-09-05 NOTE — Consult Note (Signed)
Reason for consult: Brain Tumor Consulting MD: Dr. 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 He was then brought to ED by EMS  CT of the head without contrast revealed a 3 cm x 4 cm hypodensity in the left temporal lobe which extends to the cortical margin of the left temporal operculum, worrisome for neoplasm. There is no intracranial hemorrhage evident. No midline shift or mass effect. No hydrocephalus. He was placed on Dilantin and dexamethasone. MRI brain was performed on 12/24 with contrast, showing abnormal left temporal lobe, favor a low grade glioma, measuring 49 x 37 x 47 mm. No midline shift, hemorrhage or mineralization.No other brain lesion was identified. Dr. Joya Salm proceeded with open biopsy 12/26.  Pathr results are currently pending. In the interim, CT C/A/P with contrast were performed on 12/24, with negative results. Radiation Oncology to evaluate patient. We were requested to see patient, anticipating he will require oncological care.    PMH: Past Medical History  Diagnosis Date  . Depression   . Skin cancer: fibrodermatosarcoma 1988    Surgeries: Past Surgical History  Procedure Date  . Back surgery   s/p L large excision of fibrodermatosarcoma, [facility]  Allergies: No Known Allergies  Medications:   Scheduled:   . ceFAZolin      . ceFAZolin (ANCEF) IV  1 g Intravenous Q8H  . dexamethasone  4 mg Oral Q6H  . multivitamins ther. w/minerals  1 tablet Oral Daily  . pantoprazole  40 mg Oral Q1200  . phenytoin  300 mg Oral QHS  . DISCONTD: ceFAZolin (ANCEF) IM  1 g Intramuscular Q8H  . DISCONTD: dexamethasone  4 mg Intravenous Q6H  . DISCONTD: heparin   5,000 Units Subcutaneous Q8H  . DISCONTD: omega-3 acid ethyl esters  1 g Oral Daily  . DISCONTD: phenytoin  300 mg Oral QHS  . DISCONTD: phenytoin  600 mg Oral TID   KDT:OIZTIWPYKDXIP, acetaminophen, ALPRAZolam, HYDROcodone-acetaminophen, DISCONTD: bupivacaine-EPINEPHrine, DISCONTD: fentaNYL, DISCONTD: hemostatic agents, DISCONTD: morphine, DISCONTD: ondansetron (ZOFRAN) IV, DISCONTD: ondansetron, DISCONTD: oxyCODONE, DISCONTD: promethazine, DISCONTD: sodium chloride irrigation, DISCONTD: thrombin, DISCONTD: zolpidem  JAS:NKNLZJ of Systems  Constitutional: Positive for  A 60 lb weight loss in one year, unintentional. Negative for fever, chills and malaise/fatigue.  Eyes: Negative for blurred vision and double vision.  Respiratory: Negative for cough, hemoptysis and shortness of breath.  Cardiovascular: Negative for chest pain. GI: No nausea, vomiting, diarrhea, constipation. No change in bowel caliber. No  Melena or  Hematochezia.  GU: No blood in urine. No loss of urinary control. Skin: Negative for itching. No rash. No petechia. No bruising Neurological: Acute onset of L sided  headaches. No motor or sensory deficits.Still has some expressive aphasia, he states has not improved.   Family History: Father died with colon cancer in his 68's. Mother died in her 45's with MS as well as emphysema. MGM died in her 58s with gastric cancer. No siblings. No other pertinent medical history.   Social History:  reports that he has never smoked. He does not have any smokeless tobacco history on file. He reports that he does not drink alcohol or use illicit drugs. Pt is married, 2 children ages 52 and 57 in good health. 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  Lives in [place].   Physical Exam  45 y.o. male in no acute distress  A. and O. x3 HEENT:remarkable for L healing scar at temporal area.PERRLA. Oral cavity without thrush or lesions. Neck: supple. no thyromegaly, no cervical or  supraclavicular adenopathy  Lungs: clear bilaterally . No wheezing, rhonchi or rales. No axillary masses. L 7 cm wide excisional area where fibrodermatosarcoma was located.   Cardiac: regular rate and rhythm normal S1-S2, no murmur , rubs or gallops Abdomen: soft nontender , bowel sounds x4  no hepatosplenomegaly GU/rectal: deferred. Extremities: no clubbing cyanosis . No edema.No bruising or petechial rash. Multiple moles present in extremities. Neurologic: as per Neuro    Labs  CBC  Lab 09/05/11 0402 09/02/11 0430 09/01/11 2145 09/01/11 1435  WBC 11.1* 8.4 9.4 12.4*  HGB 13.7 15.4 14.4 14.7  HCT 42.1 45.9 43.0 43.5  PLT 143* 200 134* 147*  MCV 85.7 85.6 85.1 85.1  MCH 27.9 28.7 28.5 28.8  MCHC 32.5 33.6 33.5 33.8  RDW 13.1 12.9 12.9 12.8  LYMPHSABS -- -- -- --  MONOABS -- -- -- --  EOSABS -- -- -- --  BASOSABS -- -- -- --  BANDABS -- -- -- --    CMP    Lab 09/05/11 0402 09/02/11 0430 09/01/11 2145 09/01/11 1435  NA 141 141 -- 140  K 4.0 4.4 -- 3.9  CL 106 107 -- 106  CO2 25 25 -- 25  GLUCOSE 111* 163* -- 160*  BUN 14 10 -- 11  CREATININE 0.95 1.05 0.91 1.03  CALCIUM 8.8 10.1 -- 9.3  MG -- -- 2.2 --  AST -- 23 -- 17  ALT -- 18 -- 17  ALKPHOS -- 71 -- 63  BILITOT -- 0.3 -- 0.2*        Component Value Date/Time   BILITOT 0.3 09/02/2011 0430    Studies/Results:  Ct Chest W Contrast  09/02/2011 *RADIOLOGY REPORT* Clinical Data: Left temporal lobe brain mass. Evaluate for primary neoplasm. CT CHEST, ABDOMEN AND PELVIS WITH CONTRAST Technique: Multidetector CT imaging of the chest, abdomen and pelvis was performed following the standard protocol during bolus administration of intravenous contrast. Contrast: 100 ml Omnipaque-300 and oral contrast Comparison: None CT CHEST Findings: No evidence of mediastinal or hilar masses. No lymphadenopathy seen elsewhere within the thorax. No evidence of chest wall mass. No evidence of pleural or pericardial effusion. No  suspicious pulmonary nodules or masses are identified. No evidence of pulmonary infiltrate. No central endobronchial lesions are identified. No suspicious bone lesions identified. IMPRESSION: Negative. No evidence of mass, lymphadenopathy, or other active disease. CT ABDOMEN AND PELVIS Findings: Numerous fluid attenuation hepatic cysts are seen throughout the right and left lobes. No definite solid liver masses are identified. Several small renal cysts are noted bilaterally, however there is no evidence of complex cystic or solid renal masses. No evidence of hydronephrosis. Gallbladder sludge noted, however there is no evidence of acute cholecystitis or biliary ductal dilatation. The pancreas, spleen, and adrenal glands are normal in appearance. No soft tissue masses or lymphadenopathy seen elsewhere within the abdomen or pelvis. No evidence of inflammatory process or abnormal fluid collections. No evidence of bowel wall thickening or dilatation. Normal appendix is visualized. No suspicious bone lesions identified. IMPRESSION: 1. No evidence of primary malignancy or metastatic disease. 2. Numerous hepatic and multiple bilateral small renal cysts. 3. Probable gallbladder sludge. No signs of acute cholecystitis or biliary dilatation. Original Report  Authenticated By: Danae Orleans, M.D.  Mr Laqueta Jean Wo Contrast  09/03/2011 *RADIOLOGY REPORT* Clinical Data: Brain tumor. Surgical planning. MRI HEAD WITHOUT AND WITH CONTRAST Technique: Multiplanar, multiecho pulse sequences of the brain and surrounding structures were obtained without and with intravenous contrast. Contrast: 17mL MULTIHANCE GADOBENATE DIMEGLUMINE 529 MG/ML IV SOLN Comparison: MRI 09/01/2011 Findings: Stealth protocol was performed with thin sections before and after intravenous contrast for 3-D localization in the operating room. Mass lesion in the left temporal lobe is unchanged from the recent MRI. Signal characteristics also are unchanged. The mass  involves the posterior and inferior left temporal lobe. The mass measures 4.1 x 5.4 x 3.2 cm. There is uplifting of the temporal horn on the left. The ventricles are not dilated. The mass does not show any significant enhancement. No associated hemorrhage is present within the mass. The mass has ill defined margins and appears infiltrative. The insula and superior temporal lobe appear spared. Scattered small T2 hyperintensities in the cerebral white matter do not enhance and are likely related to chronic microvascular ischemia or complex migraine headaches. No other mass lesions are present. Paranasal sinuses are clear. IMPRESSION: Infiltrative nonenhancing mass in the left temporal lobe unchanged from the prior MRI. 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 Original Report Authenticated By: Camelia Phenes, M.D.  Ct Abdomen Pelvis W Contrast  09/02/2011 *RADIOLOGY REPORT* Clinical Data: Left temporal lobe brain mass. Evaluate for primary neoplasm. CT CHEST, ABDOMEN AND PELVIS WITH CONTRAST Technique: Multidetector CT imaging of the chest, abdomen and pelvis was performed following the standard protocol during bolus administration of intravenous contrast. Contrast: 100 ml Omnipaque-300 and oral contrast Comparison: None CT CHEST Findings: No evidence of mediastinal or hilar masses. No lymphadenopathy seen elsewhere within the thorax. No evidence of chest wall mass. No evidence of pleural or pericardial effusion. No suspicious pulmonary nodules or masses are identified. No evidence of pulmonary infiltrate. No central endobronchial lesions are identified. No suspicious bone lesions identified. IMPRESSION: Negative. No evidence of mass, lymphadenopathy, or other active disease. CT ABDOMEN AND PELVIS Findings: Numerous fluid attenuation hepatic cysts are seen throughout the right and left lobes. No definite solid liver masses are identified. Several small renal cysts are noted bilaterally, however there  is no evidence of complex cystic or solid renal masses. No evidence of hydronephrosis. Gallbladder sludge noted, however there is no evidence of acute cholecystitis or biliary ductal dilatation. The pancreas, spleen, and adrenal glands are normal in appearance. No soft tissue masses or lymphadenopathy seen elsewhere within the abdomen or pelvis. No evidence of inflammatory process or abnormal fluid collections. No evidence of bowel wall thickening or dilatation. Normal appendix is visualized. No suspicious bone lesions identified. IMPRESSION: 1. No evidence of primary malignancy or metastatic disease. 2. Numerous hepatic and multiple bilateral small renal cysts. 3. Probable gallbladder sludge. No signs of acute cholecystitis or biliary dilatation. Original Report Authenticated By: Danae Orleans, M.D.      A/P: 45 y.o. male asked to see for evaluation of  L Brain mass found per CT as he presented on 12/23 with acute onset of  confusion, expressive aphasia and HA, complicated with tonic-clonic seizures. Dr. Donnie Coffin    is to see the patient following this consult with recommendations regarding diagnosis once it becomes available,  along treatment options and further workup studies.  Thank you for the referral.  [facility] 09/05/2011 11:39 AM     Attending Chart reviewed, pt examined. 45 yo with  lt temporal lobe mass, s/p biopsy. Path pending. Rad Onc is due to see in am when path is available. I discussed possible pathology including low grade astrocytoma vs GBM. As the lesion is not to be optimally debulked, given anatomy, prognosis will be dependent on pathology. No further sizures since presentation. Patient will likely be presented at brain tumor conference, with final recommendations to be made at that time.  Eston Esters MD [phone]

## 2011-09-06 DIAGNOSIS — G9389 Other specified disorders of brain: Secondary | ICD-10-CM

## 2011-09-06 MED ORDER — DEXAMETHASONE 4 MG PO TABS: 4.0000 mg | ORAL_TABLET | Freq: Four times a day (QID) | ORAL | Status: AC

## 2011-09-06 MED ORDER — PANTOPRAZOLE SODIUM 40 MG PO TBEC: 40.0000 mg | DELAYED_RELEASE_TABLET | Freq: Every day | ORAL | Status: DC

## 2011-09-06 MED ORDER — OXYCODONE HCL 5 MG PO TABS: 5.0000 mg | ORAL_TABLET | ORAL | Status: AC | PRN

## 2011-09-06 MED ORDER — ACETAMINOPHEN 325 MG PO TABS: 650.0000 mg | ORAL_TABLET | Freq: Four times a day (QID) | ORAL | Status: AC | PRN

## 2011-09-06 MED ORDER — ALPRAZOLAM 0.5 MG PO TABS: 0.5000 mg | ORAL_TABLET | Freq: Three times a day (TID) | ORAL | Status: AC | PRN

## 2011-09-06 MED ORDER — LEVETIRACETAM 500 MG PO TABS: 500.0000 mg | ORAL_TABLET | Freq: Two times a day (BID) | ORAL | Status: DC

## 2011-09-06 NOTE — Progress Notes (Signed)
Patient ID: Nathan Prince, male   DOB: [birth year], 45 y.o.   MRN: [id number] Stable. No headache. No more seizures. No weakness. Wound healing well.neurology and pharmacy taken care of his dilantin medicine.seen by oncology and radiation therapy.from my point he can go home and to call for appoinment to have the staples out , the second week of January.

## 2011-09-06 NOTE — Discharge Summary (Signed)
DISCHARGE SUMMARY  Mateo Overbeck  MR#: [id number]  DOB:Nov 01, 1965  Date of Admission: 09/01/2011 Date of Discharge: 09/06/2011  Attending Physician:MCCLUNG,JEFFREY T  Patient's PCP: no local primary care MD  Consults: Neurology Karn Cassis - NS  Pierce Crane - Med Onc Margaretmary Dys - Rad Onc  Discharge Diagnoses: -newly diagnosed L temporal lobe brain mass -new onset seizure due to above -situational anxiety/depresion  Current Discharge Medication List    START taking these medications   Details  acetaminophen (TYLENOL) 325 MG tablet Take 2 tablets (650 mg total) by mouth every 6 (six) hours as needed (or Fever >/= 101). Qty: 30 tablet    ALPRAZolam (XANAX) 0.5 MG tablet Take 1 tablet (0.5 mg total) by mouth 3 (three) times daily as needed for anxiety or sleep (please ask patient if he needs it regularly). Qty: 30 tablet, Refills: 0    dexamethasone (DECADRON) 4 MG tablet Take 1 tablet (4 mg total) by mouth 4 (four) times daily. Qty: 160 tablet, Refills: 0    levETIRAcetam (KEPPRA) 500 MG tablet Take 1 tablet (500 mg total) by mouth 2 (two) times daily. Qty: 60 tablet, Refills: 0    oxyCODONE (ROXICODONE) 5 MG immediate release tablet Take 1 tablet (5 mg total) by mouth every 4 (four) hours as needed for pain. Qty: 30 tablet, Refills: 0    pantoprazole (PROTONIX) 40 MG tablet Take 1 tablet (40 mg total) by mouth daily at 12 noon. Qty: 30 tablet, Refills: 0      CONTINUE these medications which have NOT CHANGED   Details  fish oil-omega-3 fatty acids 1000 MG capsule Take 1 g by mouth daily.      OMEGA-3 KRILL OIL PO Take 1 capsule by mouth daily.      Multiple Vitamins-Minerals (MULTIVITAMINS THER. W/MINERALS) TABS Take 1 tablet by mouth daily.         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 He was then brought to ED by EMS.  CT of the head without contrast revealed a 3 cm x 4 cm hypodensity in the left temporal lobe which extends to the cortical margin of the left temporal operculum, worrisome for neoplasm. There is no intracranial hemorrhage evident. No midline shift or mass effect. No hydrocephalus.   He was placed on Dilantin and dexamethasone. MRI brain was performed on 12/24 with contrast, showing abnormal left temporal lobe, favor a low grade glioma, measuring 49 x 37 x 47 mm. No midline shift, hemorrhage or mineralization.No other brain lesion was identified.   Dr. Jeral Fruit proceeded with open biopsy 12/26. Path results are currently pending. In the interim, CT C/A/P with contrast were performed on 12/24, with negative results.   The patient's post-operative course was without complication.  He displayed no further seizure activity.  He remained alert, oriented, and intact neurologically.    Rad Onc and Med Onc both evaluated the patient and plans are in place for the patient to f/u with Rad Onc in approx 2 weeks to consider initiated tx.  Med Onc plans to discuss the pt in tumor rounds once path results are available, at which time a comprehensive tx course will be prescribed.    Dr. Jeral Fruit has cleared the pt from a NS  standpoint, and he remains medically stable.  He and his wife have been instructed to return to the ER via EMS should he develop seizure activity or other acute neurologic symptoms.   Day of Discharge BP 112/61  Pulse 82  Temp(Src) 99.1 F (37.3 C) (Oral)  Resp 16  Ht 5\' 10"  (1.778 m)  Wt 80.5 kg (177 lb 7.5 oz)  BMI 25.46 kg/m2  SpO2 96%  Physical Exam: General: No acute respiratory distress Lungs: Clear to auscultation bilaterally without wheezes or crackles Cardiovascular: Regular rate and rhythm without murmur gallop or rub normal S1 and  S2 Abdomen: Nontender, nondistended, soft, bowel sounds positive, no rebound, no ascites, no appreciable mass Extremities: No significant cyanosis, clubbing, or edema bilateral lower extremities  Disposition: d/c home    Follow-up Appts: Discharge Orders    Future Appointments: Provider: Department: Dept Phone: Center:   09/23/2011 9:30 AM [facility] Nurse [facility] [phone] None   09/23/2011 10:00 AM Oneita Hurt, MD [facility] [phone] None     Future Orders Please Complete By Expires   Diet general      Increase activity slowly        Follow-up: Dr. Jeral Fruit in 2 weeks for staple removal Dr. Kathrynn Running as per above for XRT  Time spent in discharge (includes decision making & examination of pt): 30+ mins Signed: MCCLUNG,JEFFREY T 09/06/2011, 2:53 PM

## 2011-09-06 NOTE — Progress Notes (Signed)
  Radiation Oncology         [phone] ________________________________  Name: Nathan Prince MRN: [id number]  Date: 09/06/2011  DOB: 1966/04/20  HOLDING NOTE  I reviewed the records of this patient the radiographic findings and briefly met with him in the ICU this morning. 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 He was found to have an infiltrative neoplasm in the left temporal lobe of the brain which appears to represent a low-grade glioma. He had a biopsy of this performed earlier this week and is currently recovering from the procedure. The final pathology is not back as yet. 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 His presentation with seizures, his age of greater than 40, and the size of the neoplastic process would certainly warrant consideration for fractionated radiation and likely also incorporate concurrent Temodar chemotherapy. However, in the absence of a final formal pathology report, I was unable to provide significant detail regarding his current diagnosis, heart masses or details treatment options. Rather, we talked generally about the management of brain tumors and I encouraged him that I would followup with a more detailed formal consultation once the additional information was available. The patient is medically stable and likely to be discharged before his final pathology report is available. Accordingly, I will work towards coordinating his presentation at our next brain tumor conference on Monday, January 14 followed by a visit to the brain tumor clinic on Monday, January 14 at 9:30 AM. At that time, we will coordinate treatment.  ________________________________  Artist Pais. Kathrynn Running, M.D.

## 2011-09-11 ENCOUNTER — Other Ambulatory Visit: Payer: Self-pay | Admitting: Oncology

## 2011-09-11 DIAGNOSIS — G9389 Other specified disorders of brain: Secondary | ICD-10-CM

## 2011-09-11 MED ORDER — HYDROCORTISONE 2.5 % RE CREA: TOPICAL_CREAM | Freq: Two times a day (BID) | RECTAL | Status: AC

## 2011-09-12 ENCOUNTER — Telehealth: Payer: Self-pay | Admitting: *Deleted

## 2011-09-12 NOTE — Telephone Encounter (Signed)
Pt called wanting to see Dr. Myna Hidalgo. They are aware of 1-8 appointment and that I cancelled 1-4 with Dr. Donnie Coffin

## 2011-09-13 ENCOUNTER — Ambulatory Visit: Payer: BC Managed Care – PPO | Admitting: Oncology

## 2011-09-17 ENCOUNTER — Encounter: Payer: Self-pay | Admitting: Hematology & Oncology

## 2011-09-17 ENCOUNTER — Ambulatory Visit (HOSPITAL_BASED_OUTPATIENT_CLINIC_OR_DEPARTMENT_OTHER): Payer: BC Managed Care – PPO | Admitting: Hematology & Oncology

## 2011-09-17 VITALS — BP 113/72 | HR 72 | Temp 97.7°F | Ht 70.0 in | Wt 184.5 lb

## 2011-09-17 DIAGNOSIS — C719 Malignant neoplasm of brain, unspecified: Secondary | ICD-10-CM

## 2011-09-17 DIAGNOSIS — C712 Malignant neoplasm of temporal lobe: Secondary | ICD-10-CM

## 2011-09-17 HISTORY — DX: Malignant neoplasm of brain, unspecified: C71.9

## 2011-09-17 NOTE — Progress Notes (Signed)
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  HISTORY OF PRESENT ILLNESS:  Mr. Fell is a very nice 46 year old white gentleman.  He is a history Runner, broadcasting/film/video at [employer].  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  He apparently just woke up one morning with some confusion and headache.  His speech was slurred.  He had a seizure.  There was no incontinence.  He did have some drooling.  This lasted about a minute. The EMS came to the house.  He was brought to [facility].  He was seen by Neurology.  He did have a CT of the brain.  This showed a 3 x4 cm density in the left temporal lobe.  This was then followed by an MRI.  The MRI was done on the 24th.  The MRI showed a 4.9 x 3.7 x 4.7 cm mass in the left temporal lobe.  No other abnormalities were located or were noted.  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 Jeral Fruit was then consulted.  He went ahead and took Mr. Corbitt to surgery.  He performed an open biopsy.  I suppose that it could not be resected because the location.  He got through the biopsy without any problems.  The initial report was that this was a grade 2 astrocytoma.  Apparently, the specimen was sent to [facility].  At [facility], the pathologist felt that this was a grade 2 astrocytoma.  However, there was maybe some evidence that there was an element of higher grade malignancy.  He has been seen by Radiation Oncology.  Dr. Kathrynn Running saw him.  Dr. Kathrynn Running felt that radiation was appropriate.  Dr. Kathrynn Running also felt that concurrent chemotherapy would be a possibility.  It appears that his wife had been in contact with [facility] already.  They have an appointment to see Dr. Haskell Riling at [facility] to discuss any other possible options for him.  He is doing well.  He is on Keppra.  He has had no nausea or  vomiting. There is no weakness.  His appetite is doing okay.  He has had no double vision or blurred vision.  He says that on occasion he does have some "white streaks" with left eye.  He has had a little bit of a headache from the surgery.  For this, he only takes Tylenol.  There are no bowel or bladder issues.  He has had no bleeding.  He has had no rashes. Overall performance status is ECOG 0.  PAST MEDICAL HISTORY:  Remarkable for the dermatofibrosarcoma.  ALLERGIES:  None.  MEDICATIONS:  Xanax 0.5 mg p.o. t.i.d. p.r.n., Protonix 40 mg p.o. daily, Keppra 500 mg p.o. t.i.d.  SOCIAL HISTORY:  Negative for tobacco use.  There is no alcohol use.  He is a Engineer, site at [employer].  FAMILY HISTORY:  Unremarkable.  Father did have colon cancer.  REVIEW OF SYSTEMS:  This is as stated in history of present illness.  No additional findings were noted on the 12-system review.  He has lost 60 pounds, he said, but this is because of dieting and exercise.  His wife thinks that there may be some element of depression prior to him having the seizure.  PHYSICAL EXAM:  General:  This is a well-developed, well-nourished, white gentleman in no obvious distress.  Vital Signs:  Temperature of 97.7, pulse 72, respiratory rate 18, blood pressure 113/72.  Weight is 184.  Head and Neck Exam:  Normocephalic, atraumatic skull.  He does have the biopsy incision in the left temporal region.  He has intact extraocular muscle movement.  Pupils react appropriately.  He has no oral lesions.  His gag reflex is intact.  There is no adenopathy in his neck.  Lungs:  Clear to percussion and auscultation bilaterally.  There are no rales, wheezes, or rhonchi.  Cardiac Exam:  Regular rate and rhythm with normal S1 and S2.  There are no murmurs, rubs, or bruits. Abdominal Exam:  Soft with good bowel sounds.  There is no palpable abdominal mass.  There is no fluid wave.  There is no  palpable hepatosplenomegaly.  Chest Wall Exam:  Surgical and resection site in the left anterior chest wall.  This is well-healed.  He has a skin graft over this area.  Extremities:  No clubbing, cyanosis, or edema.  He has good range motion of his joints.  Neurological Exam:  No focal neurological deficits.  Skin Exam:  No rashes, ecchymosis, or petechia.  Laboratory studies were not done on this visit.  IMPRESSION:  Mr. Mehrer is a nice 46 year old white gentleman with what appears be a grade 2 astrocytoma.  This was located in the left temporal lobe.  This so far has been subtotally resected.  In fact, a biopsy has been done.  He will be going to [facility] to see the neuro-oncologist out there.  It is somewhat conceivable that they might be able to do a more extensive resection of this tumor.  I totally concur with Dr. Kathrynn Running regarding adjuvant therapy.  For grade 2 astrocytomas, it is recommended that radiation therapy be the primary modality for adjuvant therapy.  However, given the fact that he does have some high risk factors, one might consider adjuvant Temodar along with radiation.  We will have to see what [facility] does surgically, if anything.  I am sure that if [facility] does do surgical resection, they will send the tumor off for genetic studies.  I also would think that they would send the tumor off to see if there was methylation of the MGMT promotor site. Methylation of the MGMT promotor site often infers a better prognosis.  I would not think that he would need any type of full-dose adjuvant chemotherapy after chemoradiation therapy for his grade 2 astrocytoma.  I do not see any role for Avastin for a grade 2 astrocytoma in the adjuvant setting.  I spent a good hour-and-a-half or so with Mr. Kaufhold and his family, and all appear to be very nice.  I answered all of their questions.  We will be more than happy to help him out if any problems occur.  His appointments with  [facility] are next week.  I am sure they will be in touch with [place] regarding their findings and any plans that they may have for further surgical involvement.   ______________________________ Josph Macho, M.D. PRE/MEDQ  D:  09/17/2011  T:  09/17/2011  Job:  [id number]

## 2011-09-17 NOTE — Progress Notes (Signed)
This office note has been dictated.

## 2011-09-18 ENCOUNTER — Other Ambulatory Visit: Payer: Self-pay

## 2011-09-19 ENCOUNTER — Telehealth: Payer: Self-pay | Admitting: *Deleted

## 2011-09-19 DIAGNOSIS — G4701 Insomnia due to medical condition: Secondary | ICD-10-CM

## 2011-09-19 NOTE — Telephone Encounter (Signed)
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 He told her that he took an Oxycodone and Xanax last night so he could sleep and she is concerned about him taking pain medication when he is not in pain. Wants to know if Dr Myna Hidalgo will give him something to take that is specific for pain. Returned call at 1750, no answer at home or her cell. Left message asking her to call back but if we didn't hear back from her within 20-30 min a rx will be sent to his pharmacy on file. Dr. Myna Hidalgo ordered Restoril 15 mg 1-2 tabs qhs prn.

## 2011-09-20 MED ORDER — TEMAZEPAM 15 MG PO CAPS: ORAL_CAPSULE | ORAL | Status: DC

## 2011-09-20 NOTE — Telephone Encounter (Signed)
ok 

## 2011-09-23 ENCOUNTER — Ambulatory Visit: Payer: BC Managed Care – PPO | Admitting: Radiation Oncology

## 2011-09-23 ENCOUNTER — Ambulatory Visit: Payer: BC Managed Care – PPO

## 2011-10-07 ENCOUNTER — Ambulatory Visit: Payer: BC Managed Care – PPO | Admitting: Radiation Oncology

## 2011-10-07 ENCOUNTER — Ambulatory Visit: Payer: BC Managed Care – PPO

## 2011-10-14 ENCOUNTER — Ambulatory Visit: Payer: BC Managed Care – PPO | Admitting: Radiation Oncology

## 2011-10-14 ENCOUNTER — Inpatient Hospital Stay: Payer: Self-pay | Admitting: Oncology

## 2011-10-14 ENCOUNTER — Ambulatory Visit: Payer: BC Managed Care – PPO

## 2011-10-16 ENCOUNTER — Telehealth: Payer: Self-pay | Admitting: Hematology & Oncology

## 2011-10-16 NOTE — Telephone Encounter (Signed)
Received call from [facility] referring pt back to [place]. A Scott Lindhorst left message [phone], [phone]. I called back message said they were closed.

## 2011-10-17 ENCOUNTER — Telehealth: Payer: Self-pay | Admitting: Hematology & Oncology

## 2011-10-17 NOTE — Telephone Encounter (Signed)
Left message at [facility] at front desk and with the PA Scott Lindhorst to call me we need the records for patients 2-11 appointment

## 2011-10-18 ENCOUNTER — Other Ambulatory Visit: Payer: Self-pay

## 2011-10-21 ENCOUNTER — Other Ambulatory Visit: Payer: Self-pay | Admitting: *Deleted

## 2011-10-21 ENCOUNTER — Other Ambulatory Visit (HOSPITAL_BASED_OUTPATIENT_CLINIC_OR_DEPARTMENT_OTHER): Payer: BC Managed Care – PPO | Admitting: Lab

## 2011-10-21 ENCOUNTER — Ambulatory Visit (HOSPITAL_BASED_OUTPATIENT_CLINIC_OR_DEPARTMENT_OTHER): Payer: BC Managed Care – PPO | Admitting: Hematology & Oncology

## 2011-10-21 VITALS — BP 127/85 | HR 93 | Temp 99.1°F | Ht 70.0 in | Wt 188.0 lb

## 2011-10-21 DIAGNOSIS — C712 Malignant neoplasm of temporal lobe: Secondary | ICD-10-CM

## 2011-10-21 DIAGNOSIS — C719 Malignant neoplasm of brain, unspecified: Secondary | ICD-10-CM

## 2011-10-21 LAB — CBC WITH DIFFERENTIAL (CANCER CENTER ONLY)
BASO#: 0 10*3/uL (ref 0.0–0.2)
BASO%: 0.4 % (ref 0.0–2.0)
EOS%: 0.7 % (ref 0.0–7.0)
HGB: 11.7 g/dL — ABNORMAL LOW (ref 13.0–17.1)
LYMPH#: 1.5 10*3/uL (ref 0.9–3.3)
MCH: 28.9 pg (ref 28.0–33.4)
MCHC: 33.1 g/dL (ref 32.0–35.9)
MONO%: 7.8 % (ref 0.0–13.0)
NEUT#: 6.2 10*3/uL (ref 1.5–6.5)
Platelets: 179 10*3/uL (ref 145–400)
RDW: 14 % (ref 11.1–15.7)

## 2011-10-21 LAB — COMPREHENSIVE METABOLIC PANEL
Albumin: 3.8 g/dL (ref 3.5–5.2)
Alkaline Phosphatase: 67 U/L (ref 39–117)
BUN: 15 mg/dL (ref 6–23)
CO2: 27 mEq/L (ref 19–32)
Glucose, Bld: 86 mg/dL (ref 70–99)
Potassium: 4.1 mEq/L (ref 3.5–5.3)
Total Protein: 5.7 g/dL — ABNORMAL LOW (ref 6.0–8.3)

## 2011-10-21 LAB — LACTATE DEHYDROGENASE: LDH: 260 U/L — ABNORMAL HIGH (ref 94–250)

## 2011-10-21 MED ORDER — SULFAMETHOXAZOLE-TRIMETHOPRIM 800-160 MG PO TABS: 1.0000 | ORAL_TABLET | Freq: Two times a day (BID) | ORAL | Status: DC

## 2011-10-21 MED ORDER — TEMOZOLOMIDE 140 MG PO CAPS: 140.0000 mg | ORAL_CAPSULE | Freq: Every day | ORAL | Status: DC

## 2011-10-21 MED ORDER — LEVETIRACETAM 500 MG PO TABS: 500.0000 mg | ORAL_TABLET | Freq: Two times a day (BID) | ORAL | Status: DC

## 2011-10-21 MED ORDER — TEMOZOLOMIDE 20 MG PO CAPS: 20.0000 mg | ORAL_CAPSULE | Freq: Every day | ORAL | Status: DC

## 2011-10-21 NOTE — Progress Notes (Signed)
This office note has been dictated.

## 2011-10-22 ENCOUNTER — Ambulatory Visit (HOSPITAL_BASED_OUTPATIENT_CLINIC_OR_DEPARTMENT_OTHER)
Admission: RE | Admit: 2011-10-22 | Discharge: 2011-10-22 | Disposition: A | Discharge status: Home / Self Care | Payer: BC Managed Care – PPO | Source: Ambulatory Visit | Attending: Hematology & Oncology | Admitting: Hematology & Oncology

## 2011-10-22 DIAGNOSIS — Z01818 Encounter for other preprocedural examination: Secondary | ICD-10-CM

## 2011-10-22 DIAGNOSIS — C719 Malignant neoplasm of brain, unspecified: Secondary | ICD-10-CM

## 2011-10-22 DIAGNOSIS — M7989 Other specified soft tissue disorders: Secondary | ICD-10-CM | POA: Insufficient documentation

## 2011-10-22 NOTE — Progress Notes (Signed)
CC:   Alan Mulder, MD [name], M.D.  DIAGNOSIS:  Grade 3 oligoastrocytoma, resected, of the left temporal lobe.  CURRENT THERAPY:  Observation.  INTERIM HISTORY:  Nathan Prince comes in for his 2nd office visit.  Since we last saw him, he has been to [facility].  He was seen by Dr. Velna Ochs. He did undergo resection of his left temporal lesion.  I believe this Was a very aggressive resection of tumor.  This was done via cortical mapping. This was a very intricate procedure.  The pathology report from [facility] is consistent with a grade 3 anaplastic oligoastrocytoma.  [facility] did their battery of studies on it.  The tumor was felt to be MGMT negative.  It was IDH-1 positive.  It was EGFR negative.  Chromosome studies were done of the tumor.  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  He has recovered quite nicely.  He has not had any real neurological consequences from his surgery.  There have been no seizures.  He is on Keppra.  His word-finding seems to be pretty good.  He has not had any kind of weakness.  There have been no bowel or bladder incontinence.  He is eating well.  He has not lost weight.  He has had no cough or shortness breath.  He has had some occasional leg swelling.  He has good motor strength bilaterally.  PHYSICAL EXAM:  General:  This is a well-developed well-nourished white gentleman in no obvious distress.  Vital signs:  99.1, pulse 93, respiratory rate 16, blood pressure 127/85.  Weight is 188.  Head and neck:  Exam shows obvious surgical deformity over on the left temporal region.  His craniotomy site is healing nicely.  He has no erythema or swelling in this area.  His extraocular muscles are intact.  Pupils react appropriately.  There are no oral lesions.  There is no adenopathy in his neck.  Lungs:  Clear bilaterally.  Cardiac:  Regular rate and rhythm with a normal S1, S2.  There are no murmurs, rubs or bruits. Abdomen:  Soft with good bowel sounds.   There is no palpable abdominal mass.  There is no palpable hepatosplenomegaly.  Back:  No tenderness over the spine, ribs, or hips.  Extremities:  No clubbing, cyanosis or edema.  He has good strength bilaterally.  Neurologic:  Shows fairly intact neurological function.  LABORATORY STUDIES:  Show white count 8.5, hemoglobin 11.7, hematocrit 35.3, platelet count 179.  IMPRESSION:  Nathan Prince is a 46 year old gentleman with a grade 3 anaplastic oligoastrocytoma.  He underwent gross resection at [facility]. He clearly is a candidate for adjuvant chemoradiation therapy.  I did leave a message for Dr. Kathrynn Running to call the patient and see about getting him set up with radiation.  We will use temozolomide.  His total dose will be 160 mg a day throughout radiation.  I did also give him some Bactrim as a prophylactic measure with the temozolomide.  I am worried about the possibility of DVTs.  I do want to get a Doppler of his legs.  I want to make sure that he does not have asymptomatic DVTs that could cause problems in the future.  We are probably going to have to get him on some kind of DVT prophylaxis as the risk of thromboembolic events with primary CNS malignancies is significant.  I will plan to see him back myself in about 3 weeks.  I think that this should be a  good time frame as I do not expect him to start radiation for another week.  I spent about 45 minutes or so with Nathan Prince and his family.  They are all very, very nice.  I answered all their questions as best as I could.  They do have a very good understanding of the situation.  I still believe that we have a decent chance of cure with Nathan Prince, particularly since he had gross resection of his tumor.    ______________________________ Josph Macho, M.D. PRE/MEDQ  D:  10/21/2011  T:  10/21/2011  Job:  [id number]

## 2011-10-23 ENCOUNTER — Telehealth: Payer: Self-pay | Admitting: *Deleted

## 2011-10-24 ENCOUNTER — Ambulatory Visit
Admission: RE | Admit: 2011-10-24 | Discharge: 2011-10-24 | Disposition: A | Discharge status: Home / Self Care | Payer: BC Managed Care – PPO | Source: Ambulatory Visit | Attending: Radiation Oncology | Admitting: Radiation Oncology

## 2011-10-24 ENCOUNTER — Encounter: Payer: Self-pay | Admitting: Radiation Oncology

## 2011-10-24 DIAGNOSIS — C719 Malignant neoplasm of brain, unspecified: Secondary | ICD-10-CM

## 2011-10-24 DIAGNOSIS — Z79899 Other long term (current) drug therapy: Secondary | ICD-10-CM | POA: Insufficient documentation

## 2011-10-24 DIAGNOSIS — R110 Nausea: Secondary | ICD-10-CM

## 2011-10-24 DIAGNOSIS — Z510 Encounter for antineoplastic radiation therapy: Secondary | ICD-10-CM | POA: Insufficient documentation

## 2011-10-24 DIAGNOSIS — C712 Malignant neoplasm of temporal lobe: Secondary | ICD-10-CM

## 2011-10-24 MED ORDER — ONDANSETRON HCL 8 MG PO TABS: 8.0000 mg | ORAL_TABLET | Freq: Three times a day (TID) | ORAL | Status: DC | PRN

## 2011-10-24 NOTE — Progress Notes (Signed)
Met with patient to discuss RO billing.  Rad Tx: 16109; 60454; 09811 x  - IMRT  Dx: 191.9 Brain, NOS  Attending Rad: Dr. Lianne Cure IMRT  Pre Cert  Approved Nbr: [id number]

## 2011-10-24 NOTE — Progress Notes (Signed)
Here for formal consultation for dx of astrocytoma. Consideration of concurrent radiation and chemo of temodar. Patient has blind spot upper right visual field. Wears glasses and contacts. Weaned off decadron 1 week ago.

## 2011-10-27 ENCOUNTER — Encounter: Payer: Self-pay | Admitting: Radiation Oncology

## 2011-10-27 NOTE — Progress Notes (Signed)
  Radiation Oncology         [phone]) [phone] ________________________________  Name: Nathan Prince MRN: [id number]  Date: 10/27/2011  DOB: [birth year]  SIMULATION AND TREATMENT PLANNING NOTE  DIAGNOSIS:  Grade III Oligoastrocytoma  NARRATIVE:  The patient was brought to the CT Simulation planning suite.  Identity was confirmed.  All relevant records and images related to the planned course of therapy were reviewed.  The patient freely provided informed written consent to proceed with treatment after reviewing the details related to the planned course of therapy. The consent form was witnessed and verified by the simulation staff.  Then, the patient was set-up in a stable reproducible  supine position for radiation therapy.  CT images were obtained.  Surface markings were placed.  The CT images were loaded into the planning software.  Then the target and avoidance structures were contoured.  Treatment planning then occurred.  The radiation prescription was entered and confirmed.  A total of 1 complex treatment device was fabricated, in the form of a thermoplastic headcast for immobilization.  I have requested : Intensity Modulated Radiotherapy (IMRT) is medically necessary for this case for the following reason:  Critical CNS structure avoidance -  optic chiasm, optic nerve.  His tumor is directly abutting the optic chiasm and right optic nerves.  I have ordered:CBC  PLAN:  The patient will receive 59.4 Gy in 33 fractions.  ________________________________  Artist Pais Kathrynn Running, M.D.

## 2011-10-28 ENCOUNTER — Encounter: Payer: Self-pay | Admitting: Radiation Oncology

## 2011-10-28 NOTE — Progress Notes (Signed)
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 He was found to have a 4.9 cm tumor of the left inferior temporal lobe. Image guided biopsy showed at least grade 2 astrocytoma. On January 23, patient underwent gross total resection of the left temporal lesion with Dr. Layla Maw at [facility]. Final pathology showed anaplastic oligo astrocytoma, grade 3. He received a recommendation to undergo Temodar with concurrent radiotherapy. The patient has elected to accept these treatment recommendations and undergo adjuvant treatment in [place] with Dr. Arlan Organ.  He has currently been referred to pursue radiation treatment in our clinic.  PREVIOUS RADIATION THERAPY: No  PAST MEDICAL HISTORY:  has a past medical history of Depression; Astrocytoma (09/17/2011); and Skin cancer (1988).    PAST SURGICAL HISTORY: Past Surgical History  Procedure Date  . Craniotomy 09/04/2011    Procedure: CRANIOTOMY TUMOR EXCISION;  Surgeon: Karn Cassis;  Location: [facility];  Service: Neurosurgery;  Laterality: Left;  Left Temporal craniectomy for tumor  . Back surgery 1996    ruptured disc    FAMILY HISTORY: family history is not on file.  SOCIAL HISTORY:  reports that he has never smoked. He does not have any smokeless tobacco history on file. He reports that he does not drink alcohol or use illicit drugs.  ALLERGIES: Review of patient's allergies indicates no known allergies.  MEDICATIONS:  Current Outpatient Prescriptions  Medication  Sig Dispense Refill  . acetaminophen (TYLENOL) 650 MG CR tablet Take 650 mg by mouth every 8 (eight) hours as needed.      . docusate sodium (COLACE) 100 MG capsule Take 100 mg by mouth daily.        . fish oil-omega-3 fatty acids 1000 MG capsule Take 1 g by mouth daily.        Marland Kitchen levETIRAcetam (KEPPRA) 500 MG tablet Take 1 tablet (500 mg total) by mouth 2 (two) times daily.  60 tablet  3  . Multiple Vitamins-Minerals (MULTIVITAMINS THER. W/MINERALS) TABS Take 1 tablet by mouth daily.        . OMEGA-3 KRILL OIL PO Take 1 capsule by mouth daily.        . ondansetron (ZOFRAN) 8 MG tablet Take 1 tablet (8 mg total) by mouth every 8 (eight) hours as needed for nausea.  30 tablet  3  . pantoprazole (PROTONIX) 40 MG tablet Take 1 tablet (40 mg total) by mouth daily at 12 noon.  30 tablet  0  . sulfamethoxazole-trimethoprim (BACTRIM DS) 800-160 MG per tablet Take 1 tablet by mouth 2 (two) times daily.  30 tablet  6  . temazepam (RESTORIL) 15 MG capsule Take 1-2 tabs po as needed for sleep  60 capsule  1    REVIEW OF SYSTEMS:  A comprehensive review of systems was negative.   PHYSICAL EXAM:  weight is 186 lb 4.8 oz (84.505 kg). His temperature is 98.6 F (37 C). His blood pressure is 125/85 and his pulse is 101.   The patient is in no acute distress today. He is alert and oriented.  Head is normocephalic and atraumatic other than recent left craniotomy. Extraocular muscles are intact. Motor strength is intact nipple returns. Speech is fluent articulate. Gait is unremarkable. Patient's mood and affect are appropriate.  LABORATORY DATA:  Lab Results  Component Value Date   WBC 8.5 10/21/2011   HGB 11.7* 10/21/2011   HCT 35.3* 10/21/2011   MCV 87 10/21/2011   PLT 179 10/21/2011   Lab Results  Component Value Date   NA 138 10/21/2011   K 4.1 10/21/2011   CL 104 10/21/2011   CO2 27 10/21/2011   Lab Results  Component Value Date   ALT 52 10/21/2011   AST 22 10/21/2011   ALKPHOS 67 10/21/2011   BILITOT  0.5 10/21/2011     RADIOGRAPHY: US Venous Img Lower Bilateral  10/22/2011  *RADIOLOGY REPORT*  Clinical Data: History of glioma, now with occasional leg swelling, evaluate for DVT prior to initiating radiation and chemotherapy.  BILATERAL LOWER EXTREMITY VENOUS DUPLEX ULTRASOUND  Technique:  Gray-scale sonography with graded compression, as well as color Doppler and duplex ultrasound, were performed to evaluate the deep venous system of both lower extremities from the level of the common femoral vein through the popliteal and proximal calf veins.  Spectral Doppler was utilized to evaluate flow at rest and with distal augmentation maneuvers.  Comparison:  None.  Findings:  Normal compressibility of bilateral common femoral, superficial femoral, and popliteal veins is demonstrated, as well as the visualized proximal calf veins.  No filling defects to suggest DVT on grayscale or color Doppler imaging.  Doppler waveforms show normal direction of venous flow, normal respiratory phasicity and response to augmentation.  The greater saphenous vein is not seen at the level of the knee or calf, however is seen at the level of the ankle but is noncompressible at this location.  IMPRESSION: 1.  No evidence of deep vein thrombosis within either lower extremity.  2.  The right-sided greater saphenous vein is not seen at the level of the knee or calf, however is seen at the level of the ankle but is noncompressible at this location.  Correlation for prior vein harvesting or ablation is recommended.  Original Report Authenticated By: Waynard Reeds, M.D.      IMPRESSION: This patient is a very nice 46 year old gentleman with grade 3 oligo astrocytoma from the left inferior temporal lobe. He underwent maximal surgical resection at [facility] and would benefit from adjuvant radiotherapy to the tumor site to delay or potentially prevent local recurrence and improve overall survival.  PLAN: Today, I talked to the patient about the  findings and workup thus far. Dr. the role of radiotherapy in the management of primary brain tumors. Talked about the logistics and delivery of radiation treatment as well as the anticipated acute and late sequelae. We spent more than 50% of our one-hour face-to-face visit in counseling. I filled out a patient counseling form for him and we retained a copy for our records. The patient would like to proceed with radiation treatment as discussed and will be set up for radiation planning later this morning. The proximity of his tumor to the optic chiasm and left optic nerve in addition to its proximity to his left cochlea warrant the use of intensity modulated radiotherapy to provide a homogeneous full dose of radiation to the target volume while maintaining radiation dose levels to these critical structures at or below established tolerance levels. In this sense, IMRT is medically necessary. We will tentatively plan to treat  the patient with, therapy for the purpose of image guidance and IMRT. I enjoyed seeing Mr. Harts today and will look forward to participating in his care.   ------------------------------------------------  Artist Pais. Kathrynn Running, M.D.

## 2011-10-29 ENCOUNTER — Telehealth: Payer: Self-pay | Admitting: *Deleted

## 2011-10-29 ENCOUNTER — Other Ambulatory Visit: Payer: Self-pay | Admitting: Hematology & Oncology

## 2011-10-29 ENCOUNTER — Encounter: Payer: Self-pay | Admitting: *Deleted

## 2011-10-29 ENCOUNTER — Other Ambulatory Visit: Payer: Self-pay | Admitting: *Deleted

## 2011-10-29 DIAGNOSIS — C719 Malignant neoplasm of brain, unspecified: Secondary | ICD-10-CM

## 2011-10-29 MED ORDER — TEMOZOLOMIDE 140 MG PO CAPS: 140.0000 mg | ORAL_CAPSULE | Freq: Every day | ORAL | Status: AC

## 2011-10-29 MED ORDER — TEMOZOLOMIDE 20 MG PO CAPS: 20.0000 mg | ORAL_CAPSULE | Freq: Every day | ORAL | Status: AC

## 2011-10-29 MED ORDER — SULFAMETHOXAZOLE-TRIMETHOPRIM 800-160 MG PO TABS: 1.0000 | ORAL_TABLET | Freq: Once | ORAL | Status: AC

## 2011-10-29 NOTE — Telephone Encounter (Signed)
[facility] spoke with cindy, have letter of authorization in hand asked if they needed me to fax this to them, per Arline Asp they have the letter also, patient can pick up today the Rx odanestran,will call the wife again on her cell phone [phone] and leave message Rx can be picked up 3:41 PM

## 2011-10-29 NOTE — Telephone Encounter (Signed)
Called pt wife cell phone  Per her request, Alphia Moh had called earlier around 1000 am and had left message then to call back at [phone], haven't heard from wife, so I called wife's cell phone again and left message  For her to call myself or Elnita Maxwell 12:41 PM

## 2011-10-29 NOTE — Telephone Encounter (Signed)
Received a call from Mrs. Tuite regarding Nathan Prince's prescriptions. [facility] only [facility] one for Temodar 20 mg po daily and her notes from the office visit stated he should take 160 mg daily. There was also some confusion with the Bactrim. Reviewed with Dr Myna Hidalgo. To take Temodar 160 mg daily and Bactrim 1 tab po daily x 42 days. [facility] was [facility]. Mrs. Furukawa made aware that her notes were correct.

## 2011-10-29 NOTE — Progress Notes (Signed)
Called  Prior authorization  [phone], for [facility] authorization form pt uses [employer] excripts , have case Review Number# [id number], per automatic voice message they will fax request for  [place] ,waiting on information to be faxed, will relay message to wife when she call back 1:12 PM

## 2011-10-29 NOTE — Telephone Encounter (Signed)
Called pt cell phone left message we are working on getting odansetron  Rx authorized, was sent form requesting prior authorization required, called and waiting on fax back, can take any where form 2-5 days, she can call for any questions, or requests 1:28 PM

## 2011-11-01 NOTE — Telephone Encounter (Signed)
xxx

## 2011-11-04 ENCOUNTER — Ambulatory Visit
Admission: RE | Admit: 2011-11-04 | Discharge: 2011-11-04 | Disposition: A | Discharge status: Home / Self Care | Payer: BC Managed Care – PPO | Source: Ambulatory Visit | Attending: Radiation Oncology | Admitting: Radiation Oncology

## 2011-11-04 ENCOUNTER — Ambulatory Visit: Admission: RE | Admit: 2011-11-04 | Payer: Self-pay | Source: Ambulatory Visit | Admitting: Radiation Oncology

## 2011-11-04 NOTE — Progress Notes (Signed)
POST SIM ED DONE WITH PATIENT, WIFE AND IN-LAWS.  ALL VERBALIZE UNDERSTANDING.  BOOKLET, RADIATION AND YOU GIVEN TO PT WITH PERTINENT INFO MARKED.  WIFE ASKED APPROPRIATE QUESTIONS AND HAD SEVERAL ABOUT THE TOMO MACHINE.  I TOOK HER, PT AND FAMILY BACK TO MACHINE AS IT WAS LUNCH TIME AND SHOWED THEM THE MACHINE AND EXPLAINED TO THEM THAT THE THERAPISTS COULD SEE HIM AND HEAR HIM AT ALL TIMES.  THIS SEEMED TO ALEVE SOME OF THEIR ANXIETY.  ALSO TOLD THE WIFE THE DAY THAT DR. MANNING SAW HI PUT'S BECAUSE SHE SAID THAT SHE WOULD WANT TO COME FROM HER JOB SOME DAYS.

## 2011-11-05 ENCOUNTER — Ambulatory Visit
Admission: RE | Admit: 2011-11-05 | Discharge: 2011-11-05 | Disposition: A | Discharge status: Home / Self Care | Payer: BC Managed Care – PPO | Source: Ambulatory Visit | Attending: Radiation Oncology | Admitting: Radiation Oncology

## 2011-11-06 ENCOUNTER — Ambulatory Visit
Admission: RE | Admit: 2011-11-06 | Discharge: 2011-11-06 | Disposition: A | Discharge status: Home / Self Care | Payer: BC Managed Care – PPO | Source: Ambulatory Visit | Attending: Radiation Oncology | Admitting: Radiation Oncology

## 2011-11-07 ENCOUNTER — Ambulatory Visit
Admission: RE | Admit: 2011-11-07 | Discharge: 2011-11-07 | Disposition: A | Discharge status: Home / Self Care | Payer: BC Managed Care – PPO | Source: Ambulatory Visit | Attending: Radiation Oncology | Admitting: Radiation Oncology

## 2011-11-08 ENCOUNTER — Encounter: Payer: Self-pay | Admitting: Radiation Oncology

## 2011-11-08 ENCOUNTER — Ambulatory Visit
Admission: RE | Admit: 2011-11-08 | Discharge: 2011-11-08 | Disposition: A | Discharge status: Home / Self Care | Payer: BC Managed Care – PPO | Source: Ambulatory Visit | Attending: Radiation Oncology | Admitting: Radiation Oncology

## 2011-11-08 VITALS — BP 127/80 | HR 81 | Resp 18 | Wt 190.9 lb

## 2011-11-08 DIAGNOSIS — C719 Malignant neoplasm of brain, unspecified: Secondary | ICD-10-CM

## 2011-11-08 NOTE — Progress Notes (Signed)
Patient presents to the clinic today accompanied by his mother in law for an under treat visit with Dr. Kathrynn Running. Patient is alert and oriented to person, place, and time. No distress noted. Steady gait noted. Pleasant affect noted. Patient reports persistent nausea but, denies emesis. Patient states, the nausea has improved and he no longer has to take his antiemetic around the clock. Patient reports taking Tylenol daily but that the intensity of his headaches are less. Patient denies diplopia but reports he has always seen floaters. Patient reports sleeping an average of 7-8 hours per night. Patient reports that he feels "very tired" despite getting adequate sleep. Patient taking Temodar daily. Reported all findings to Dr. Kathrynn Running.

## 2011-11-08 NOTE — Progress Notes (Signed)
  Radiation Oncology         (336) [phone] ________________________________  Name: Zohair Epp MRN: [id number]  Date: 11/08/2011  DOB: Nov 09, 1965  Weekly Radiation Therapy Management  Current Dose: 9 Gy     Planned Dose:  59.4 Gy  Narrative . . . . . . . . The patient presents for routine under treatment assessment.                                             The patient is without complaint except mild nausea                                 Set-up films were reviewed.                                 The chart was checked. Physical Findings. . . Weight essentially stable.  No significant changes. Impression . . . . . . . The patient is  tolerating radiation. Plan . . . . . . . . . . . . Continue treatment as planned.  ________________________________  Artist Pais. Kathrynn Running, M.D.

## 2011-11-11 ENCOUNTER — Other Ambulatory Visit (HOSPITAL_BASED_OUTPATIENT_CLINIC_OR_DEPARTMENT_OTHER): Payer: BC Managed Care – PPO | Admitting: Lab

## 2011-11-11 ENCOUNTER — Ambulatory Visit
Admission: RE | Admit: 2011-11-11 | Discharge: 2011-11-11 | Disposition: A | Discharge status: Home / Self Care | Payer: BC Managed Care – PPO | Source: Ambulatory Visit | Attending: Radiation Oncology | Admitting: Radiation Oncology

## 2011-11-11 ENCOUNTER — Ambulatory Visit (HOSPITAL_BASED_OUTPATIENT_CLINIC_OR_DEPARTMENT_OTHER): Payer: BC Managed Care – PPO | Admitting: Hematology & Oncology

## 2011-11-11 VITALS — BP 129/84 | HR 101 | Temp 97.3°F | Ht 70.0 in | Wt 184.0 lb

## 2011-11-11 DIAGNOSIS — C719 Malignant neoplasm of brain, unspecified: Secondary | ICD-10-CM

## 2011-11-11 DIAGNOSIS — C712 Malignant neoplasm of temporal lobe: Secondary | ICD-10-CM

## 2011-11-11 LAB — CBC WITH DIFFERENTIAL (CANCER CENTER ONLY)
EOS%: 0.6 % (ref 0.0–7.0)
Eosinophils Absolute: 0.1 10*3/uL (ref 0.0–0.5)
LYMPH%: 27.1 % (ref 14.0–48.0)
MCH: 28.2 pg (ref 28.0–33.4)
MCHC: 32.9 g/dL (ref 32.0–35.9)
MCV: 86 fL (ref 82–98)
MONO%: 6.4 % (ref 0.0–13.0)
NEUT#: 5.3 10*3/uL (ref 1.5–6.5)
Platelets: 328 10*3/uL (ref 145–400)
RBC: 4.68 10*6/uL (ref 4.20–5.70)

## 2011-11-11 LAB — COMPREHENSIVE METABOLIC PANEL
AST: 20 U/L (ref 0–37)
Alkaline Phosphatase: 74 U/L (ref 39–117)
BUN: 13 mg/dL (ref 6–23)
Glucose, Bld: 102 mg/dL — ABNORMAL HIGH (ref 70–99)
Potassium: 4.1 mEq/L (ref 3.5–5.3)
Sodium: 140 mEq/L (ref 135–145)
Total Bilirubin: 0.3 mg/dL (ref 0.3–1.2)

## 2011-11-11 NOTE — Progress Notes (Signed)
This office note has been dictated.

## 2011-11-11 NOTE — Progress Notes (Signed)
CC:   Nathan Simmonds, MD Nathan Prince, M.D.  DIAGNOSIS:  Grade 3 oligoastrocytoma-resected-left temporal lobe.  CURRENT THERAPY:  The patient has started radiation therapy with low- dose Temodar.  INTERIM HISTORY:  Nathan Prince comes in for followup.  He has had 6 treatments so far of radiation.  He has done well with this.  He has done well with the Temodar.  Again, he is on low-dose Temodar with radiation.  He has had no headache.  He is trying to stay active.  His appetite has been good.  He did have a little bit of nausea but was not taking the nausea medication.  He is being more diligent with this now.  We did go ahead and do up front Dopplers of his legs prior to his treatment.  Dopplers were negative for any evidence of any type of a thromboembolic event.  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  He has had no diarrhea.  He has had no mouth sores.  He has had no double vision or blurred vision.  Overall, his perform status is ECOG 1.  PHYSICAL EXAM:  General: This is a well-developed, well-nourished, white gentleman in no obvious distress.  Vital signs: Show a temperature of 97.3, pulse 101, respiratory rate 18, blood pressure 129/84, and weight is 184.  Head and neck exam shows a normocephalic, atraumatic skull. There are no ocular or oral lesions.  There are no palpable cervical or supraclavicular lymph nodes.  He does have the left temporal craniotomy scar which is well-healed.  His hair has come back nicely on his head. There is no thyroid.  Lungs are clear bilaterally.  Cardiac examination: Regular rhythm with a normal S1 and S2.  There are no murmurs, rubs, or bruits.  Abdominal exam: Soft, good bowel sounds.  There is no palpable abdominal mass.  There is no fluid wave.  There is no palpable hepatosplenomegaly.  Back exam:  No tenderness over the spine, ribs, or hips.  Extremities: Shows no clubbing, cyanosis, or edema.   Neurological exam: Shows no focal neurological deficits.  His mental functions are intact.  LABORATORY STUDIES:  White cell count is 8.1, hemoglobin 13.2, hematocrit 40.1, and platelet count 228.  IMPRESSION:  Nathan Prince Is a 46 year old gentleman with a grade 3 oligoastrocytoma.  This is of the left upper lobe.  He did have a complete resection which was definitely favorable.  I still believe that his outcome is going to be quite good.  The fact that he underwent a complete resection to me, I think it is a very positive finding.  We will go ahead and continue him on the low-dose Temodar.  He will continue his Bactrim.  We will plan to get him back to see [place] in 4 weeks.  He sees the radiation oncologist weekly, and if there is any problems they will certainly let us know.    ______________________________ Josph Macho, M.D. PRE/MEDQ  D:  11/11/2011  T:  11/11/2011  Job:  [id number]

## 2011-11-12 ENCOUNTER — Ambulatory Visit
Admission: RE | Admit: 2011-11-12 | Discharge: 2011-11-12 | Disposition: A | Discharge status: Home / Self Care | Payer: BC Managed Care – PPO | Source: Ambulatory Visit | Attending: Radiation Oncology | Admitting: Radiation Oncology

## 2011-11-13 ENCOUNTER — Ambulatory Visit
Admission: RE | Admit: 2011-11-13 | Discharge: 2011-11-13 | Disposition: A | Discharge status: Home / Self Care | Payer: BC Managed Care – PPO | Source: Ambulatory Visit | Attending: Radiation Oncology | Admitting: Radiation Oncology

## 2011-11-14 ENCOUNTER — Ambulatory Visit
Admission: RE | Admit: 2011-11-14 | Discharge: 2011-11-14 | Disposition: A | Discharge status: Home / Self Care | Payer: BC Managed Care – PPO | Source: Ambulatory Visit | Attending: Radiation Oncology | Admitting: Radiation Oncology

## 2011-11-14 ENCOUNTER — Encounter: Payer: Self-pay | Admitting: Radiation Oncology

## 2011-11-14 DIAGNOSIS — C719 Malignant neoplasm of brain, unspecified: Secondary | ICD-10-CM

## 2011-11-14 DIAGNOSIS — C712 Malignant neoplasm of temporal lobe: Secondary | ICD-10-CM | POA: Insufficient documentation

## 2011-11-14 DIAGNOSIS — G9389 Other specified disorders of brain: Secondary | ICD-10-CM

## 2011-11-14 NOTE — Progress Notes (Signed)
  Radiation Oncology         [phone] ________________________________  Name: Nathan Prince MRN: [id number]  Date: 11/14/2011  DOB: 1966-04-09  Weekly Radiation Therapy Management  Current Dose: 16.2 Gy     Planned Dose:  59.4 Gy  Narrative . . . . . . . . The patient presents for routine under treatment assessment.                                           The patient is without complaint.                                 Set-up films were reviewed.                                 The chart was checked. Physical Findings. . . Weight essentially stable.  No significant changes. Impression . . . . . . . The patient is  tolerating radiation. Plan . . . . . . . . . . . . Continue treatment as planned.  ________________________________  Artist Pais. Kathrynn Running, M.D.

## 2011-11-14 NOTE — Progress Notes (Signed)
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  HE SAYS THAT HIS APPETITE IS GOOD BUT COULD GO WITHOUT EATING BUT HE DOES NOT EAT THE SAME THINGS ANYMORE.  HE IS EATING MORE HEALTHY BUT NOT BY CHOICE, HE JUST WANTS FRUITS, VEGGIES INSTEAD OF MEAT NOW.  NO HEADACHES NOW , NOT HAVING TO TAKE TYLENOL FOR THIS.  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  NAUSEA IS BETTER, TAKING ZOFRAN AND IS REALLY HELPING.

## 2011-11-15 ENCOUNTER — Encounter: Payer: Self-pay | Admitting: *Deleted

## 2011-11-15 ENCOUNTER — Ambulatory Visit
Admission: RE | Admit: 2011-11-15 | Discharge: 2011-11-15 | Disposition: A | Discharge status: Home / Self Care | Payer: BC Managed Care – PPO | Source: Ambulatory Visit | Attending: Radiation Oncology | Admitting: Radiation Oncology

## 2011-11-15 DIAGNOSIS — C719 Malignant neoplasm of brain, unspecified: Secondary | ICD-10-CM

## 2011-11-15 MED ORDER — BIAFINE EX EMUL
CUTANEOUS | Status: AC | PRN
  Administered 2011-11-15: 14:00:00 via TOPICAL

## 2011-11-15 NOTE — Progress Notes (Signed)
Encounter addended by: Lowella Petties, RN on: 11/15/2011  1:54 PM<BR>     Documentation filed: Orders

## 2011-11-18 ENCOUNTER — Ambulatory Visit
Admission: RE | Admit: 2011-11-18 | Discharge: 2011-11-18 | Disposition: A | Discharge status: Home / Self Care | Payer: BC Managed Care – PPO | Source: Ambulatory Visit | Attending: Radiation Oncology | Admitting: Radiation Oncology

## 2011-11-19 ENCOUNTER — Ambulatory Visit
Admission: RE | Admit: 2011-11-19 | Discharge: 2011-11-19 | Disposition: A | Discharge status: Home / Self Care | Payer: BC Managed Care – PPO | Source: Ambulatory Visit | Attending: Radiation Oncology | Admitting: Radiation Oncology

## 2011-11-19 ENCOUNTER — Encounter: Payer: Self-pay | Admitting: Radiation Oncology

## 2011-11-19 DIAGNOSIS — C719 Malignant neoplasm of brain, unspecified: Secondary | ICD-10-CM

## 2011-11-19 NOTE — Progress Notes (Signed)
Patient presents to the clinic today accompanied by his mother in law requesting to be seen by a physician for vision changes. Patient is alert and oriented to person, place and time. No distress noted. Steady gait noted. Pleasant affect noted. Patient denies pain at this time. Patient reports tunnel vision that is difficult to describe. Patient reports however that he can see in his peripherally. Patient reports floaters but, confirms he has always had those. Patient reports daily headaches that he "heads off" with Tylenol. Patient reports continuous nausea that is "made better" with around the clock antiemetics. Patient reports that he has no appetite but continues to eat without difficulty. Patient denies seizure activity. Reported all findings to Dr. Michell Heinrich.

## 2011-11-19 NOTE — Progress Notes (Signed)
Weekly Management Note Current Dose:21.6 Gy  Projected Dose: 59.4 Gy   Narrative: CBCT/MVCT images/Port film x-rays were reviewed.  The chart was checked. Pt requested to be seen for unscheduled PUT visit.  Has been having a narrowing of vision since his operation. Not acutely worse but not better.  Headaches are minimal, stable and well controlled on Tylenol. Not on decadron. Nausea controlled with zofran.   Physical Findings:  Alert and oriented x 3. No focal deficits. Ambulating.   Vitals:  Filed Vitals:   11/19/11 1205  BP: 123/75  Pulse: 71  Resp: 18   Weight:  Wt Readings from Last 3 Encounters:  11/19/11 186 lb 3.2 oz (84.46 kg)  11/14/11 184 lb (83.462 kg)  11/11/11 184 lb (83.462 kg)   Lab Results  Component Value Date   WBC 8.1 11/11/2011   HGB 13.2 11/11/2011   HCT 40.1 11/11/2011   MCV 86 11/11/2011   PLT 328 11/11/2011   Lab Results  Component Value Date   CREATININE 1.24 11/11/2011   BUN 13 11/11/2011   NA 140 11/11/2011   K 4.1 11/11/2011   CL 103 11/11/2011   CO2 26 11/11/2011     Impression:  The patient is tolerating radiation.  Plan:  Continue treatment as planned. Discussed time course of neuro deficits and plasticity of CNS.  Discussed worrisome signs during treatment of edema (worsening headaches, nausea, word finding etc).  Will see Dr. Kathrynn Running on Thursday.

## 2011-11-20 ENCOUNTER — Ambulatory Visit
Admission: RE | Admit: 2011-11-20 | Discharge: 2011-11-20 | Disposition: A | Discharge status: Home / Self Care | Payer: BC Managed Care – PPO | Source: Ambulatory Visit | Attending: Radiation Oncology | Admitting: Radiation Oncology

## 2011-11-21 ENCOUNTER — Telehealth: Payer: Self-pay

## 2011-11-21 ENCOUNTER — Encounter: Payer: Self-pay | Admitting: Radiation Oncology

## 2011-11-21 ENCOUNTER — Ambulatory Visit
Admission: RE | Admit: 2011-11-21 | Discharge: 2011-11-21 | Disposition: A | Discharge status: Home / Self Care | Payer: BC Managed Care – PPO | Source: Ambulatory Visit | Attending: Radiation Oncology | Admitting: Radiation Oncology

## 2011-11-21 VITALS — Wt 184.9 lb

## 2011-11-21 DIAGNOSIS — C719 Malignant neoplasm of brain, unspecified: Secondary | ICD-10-CM

## 2011-11-21 NOTE — Progress Notes (Signed)
  Radiation Oncology         [phone]) [phone] ________________________________  Name: Nathan Prince MRN: [id number]  Date: 11/21/2011  DOB: Jan 15, 1966  Weekly Radiation Therapy Management  Current Dose: 23.4 Gy     Planned Dose:  59.4 Gy  Narrative . . . . . . . . The patient presents for routine under treatment assessment.                                           The patient is without complaint.  Appetite good.  Some nausea                                Energy level is good.                                 Set-up films were reviewed.                                 The chart was checked. Physical Findings. . . Weight essentially stable.  No significant changes. Impression . . . . . . . The patient is  tolerating radiation. Plan . . . . . . . . . . . . Continue treatment as planned.  ________________________________  Artist Pais. Kathrynn Running, M.D.

## 2011-11-21 NOTE — Telephone Encounter (Signed)
Called pt to f/u on distress screening per request of social worker Tamala Julian. Unable to reach at home #. Called listed work #, woman answered stating caller had wrong #. Called mobile #, unable to reach.  Braelin Brosch, Bora B MS, LPCA, NCC

## 2011-11-21 NOTE — Progress Notes (Signed)
Pt alert and oriented x3, steady gait, had nausea yesterday only,took zofran which helped, pt eats after radiation daily,doesn't eat breakfast stated, had strawberry cobbler ice cream, ate some grapes banana,tortilla chips later in the day, no c/o pain or headache,still has tunnel vision stated, takes tylenol prn for headache, energy level today is some better 11:38 AM

## 2011-11-22 ENCOUNTER — Ambulatory Visit: Payer: BC Managed Care – PPO

## 2011-11-25 ENCOUNTER — Ambulatory Visit
Admission: RE | Admit: 2011-11-25 | Discharge: 2011-11-25 | Disposition: A | Discharge status: Home / Self Care | Payer: BC Managed Care – PPO | Source: Ambulatory Visit | Attending: Radiation Oncology | Admitting: Radiation Oncology

## 2011-11-26 ENCOUNTER — Ambulatory Visit
Admission: RE | Admit: 2011-11-26 | Discharge: 2011-11-26 | Disposition: A | Discharge status: Home / Self Care | Payer: BC Managed Care – PPO | Source: Ambulatory Visit | Attending: Radiation Oncology | Admitting: Radiation Oncology

## 2011-11-27 ENCOUNTER — Ambulatory Visit
Admission: RE | Admit: 2011-11-27 | Discharge: 2011-11-27 | Disposition: A | Discharge status: Home / Self Care | Payer: BC Managed Care – PPO | Source: Ambulatory Visit | Attending: Radiation Oncology | Admitting: Radiation Oncology

## 2011-11-28 ENCOUNTER — Ambulatory Visit
Admission: RE | Admit: 2011-11-28 | Payer: BC Managed Care – PPO | Source: Ambulatory Visit | Admitting: Radiation Oncology

## 2011-11-28 ENCOUNTER — Ambulatory Visit
Admission: RE | Admit: 2011-11-28 | Discharge: 2011-11-28 | Disposition: A | Discharge status: Home / Self Care | Payer: BC Managed Care – PPO | Source: Ambulatory Visit | Attending: Radiation Oncology | Admitting: Radiation Oncology

## 2011-11-28 ENCOUNTER — Encounter: Payer: Self-pay | Admitting: Radiation Oncology

## 2011-11-28 VITALS — BP 119/82 | HR 73 | Resp 18 | Wt 184.6 lb

## 2011-11-28 DIAGNOSIS — C719 Malignant neoplasm of brain, unspecified: Secondary | ICD-10-CM

## 2011-11-28 NOTE — Progress Notes (Signed)
Patient presents to the clinic today accompanied by his mother in law for an under treat visit with Dr. Kathrynn Running. Patient is alert and oriented to person, place, and time. No distress noted. Steady gait noted. Pleasant affect noted. Patient denies pain at this time. Patient denies taking headache medication or having a headache in some time. Patient has no complaints at this time. Patient reports attending school and walking regularly. Patient reports he is more active.  Patient reports taking a nap daily but reports his energy level is "not bad." Patient reports a good appetite. Weight has remained stable. Patient complaints of nausea but reports taking Zofran each morning. Denies diplopia. Floaters continue. Reported all findings to Dr. Kathrynn Running.

## 2011-11-29 ENCOUNTER — Ambulatory Visit
Admission: RE | Admit: 2011-11-29 | Discharge: 2011-11-29 | Disposition: A | Discharge status: Home / Self Care | Payer: BC Managed Care – PPO | Source: Ambulatory Visit | Attending: Radiation Oncology | Admitting: Radiation Oncology

## 2011-11-29 ENCOUNTER — Encounter: Payer: Self-pay | Admitting: Radiation Oncology

## 2011-11-29 NOTE — Progress Notes (Signed)
  Radiation Oncology         (336) [phone] ________________________________  Name: Nathan Prince MRN: [id number]  Date: 11/29/2011  DOB: 1966/01/05  Weekly Radiation Therapy Management  Current Dose: 32.4 Gy     Planned Dose:  59.4 Gy  Narrative . . . . . . . . The patient presents for routine under treatment assessment.                                                   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 He was disappointed about this. He denies headaches nausea and vomiting. He reports some queasiness for which she uses Zofran once or twice daily. The patient has experienced hair loss along the left scalp                                 Set-up films were reviewed.                                 The chart was checked. Physical Findings. . . Weight essentially stable.  No significant changes. Impression . . . . . . . The patient is  tolerating radiation. Plan . . . . . . . . . . . . Continue treatment as planned.  ________________________________  Artist Pais. Kathrynn Running, M.D.

## 2011-12-02 ENCOUNTER — Ambulatory Visit
Admission: RE | Admit: 2011-12-02 | Discharge: 2011-12-02 | Disposition: A | Discharge status: Home / Self Care | Payer: BC Managed Care – PPO | Source: Ambulatory Visit | Attending: Radiation Oncology | Admitting: Radiation Oncology

## 2011-12-03 ENCOUNTER — Ambulatory Visit
Admission: RE | Admit: 2011-12-03 | Discharge: 2011-12-03 | Disposition: A | Discharge status: Home / Self Care | Payer: BC Managed Care – PPO | Source: Ambulatory Visit | Attending: Radiation Oncology | Admitting: Radiation Oncology

## 2011-12-04 ENCOUNTER — Ambulatory Visit
Admission: RE | Admit: 2011-12-04 | Discharge: 2011-12-04 | Disposition: A | Discharge status: Home / Self Care | Payer: BC Managed Care – PPO | Source: Ambulatory Visit | Attending: Radiation Oncology | Admitting: Radiation Oncology

## 2011-12-05 ENCOUNTER — Ambulatory Visit
Admission: RE | Admit: 2011-12-05 | Discharge: 2011-12-05 | Disposition: A | Discharge status: Home / Self Care | Payer: BC Managed Care – PPO | Source: Ambulatory Visit | Attending: Radiation Oncology | Admitting: Radiation Oncology

## 2011-12-05 ENCOUNTER — Encounter: Payer: Self-pay | Admitting: Radiation Oncology

## 2011-12-05 VITALS — BP 121/82 | HR 78 | Resp 18 | Wt 183.7 lb

## 2011-12-05 DIAGNOSIS — G9389 Other specified disorders of brain: Secondary | ICD-10-CM

## 2011-12-05 DIAGNOSIS — C719 Malignant neoplasm of brain, unspecified: Secondary | ICD-10-CM

## 2011-12-05 NOTE — Progress Notes (Signed)
Patient presents to the clinic today accompanied by his mother in law for a PUT with Dr. Kathrynn Running. Patient is alert and oriented to person, place, and time. No distress noted. Steady gait noted. Pleasant affect noted. Patient denies pain at this time. Patient denies dizziness. Patient denies seizure activity. Patient reports floaters are unchanged. Patient denies having any headaches. Patient reports taking zofran otc to prevent nausea. Weight stable.    Patient not taking decadron.

## 2011-12-05 NOTE — Progress Notes (Signed)
  Radiation Oncology         (336) [phone] ________________________________  Name: Jensyn Cambria MRN: [id number]  Date: 12/05/2011  DOB: August 25, 1966  Weekly Radiation Therapy Management  Current Dose: 41.4 Gy     Planned Dose:  59.4 Gy  Narrative . . . . . . . . The patient presents for routine under treatment assessment.                                                      The patient is without complaint.  He denies headache, nausea, and vomiting                                 Set-up films were reviewed.                                 The chart was checked. Physical Findings. . . Weight essentially stable.  No significant changes.  He has epilation on the left temporal scalp. Impression . . . . . . . The patient is  tolerating radiation. Plan . . . . . . . . . . . . Continue treatment as planned.  ________________________________  Artist Pais. Kathrynn Running, M.D.

## 2011-12-06 ENCOUNTER — Ambulatory Visit
Admission: RE | Admit: 2011-12-06 | Discharge: 2011-12-06 | Disposition: A | Discharge status: Home / Self Care | Payer: BC Managed Care – PPO | Source: Ambulatory Visit | Attending: Radiation Oncology | Admitting: Radiation Oncology

## 2011-12-09 ENCOUNTER — Other Ambulatory Visit (HOSPITAL_BASED_OUTPATIENT_CLINIC_OR_DEPARTMENT_OTHER): Payer: BC Managed Care – PPO | Admitting: Lab

## 2011-12-09 ENCOUNTER — Ambulatory Visit (HOSPITAL_BASED_OUTPATIENT_CLINIC_OR_DEPARTMENT_OTHER): Payer: BC Managed Care – PPO | Admitting: Hematology & Oncology

## 2011-12-09 ENCOUNTER — Ambulatory Visit
Admission: RE | Admit: 2011-12-09 | Discharge: 2011-12-09 | Disposition: A | Discharge status: Home / Self Care | Payer: BC Managed Care – PPO | Source: Ambulatory Visit | Attending: Radiation Oncology | Admitting: Radiation Oncology

## 2011-12-09 VITALS — BP 118/70 | HR 94 | Temp 96.4°F | Ht 70.0 in | Wt 184.0 lb

## 2011-12-09 DIAGNOSIS — C712 Malignant neoplasm of temporal lobe: Secondary | ICD-10-CM

## 2011-12-09 DIAGNOSIS — C719 Malignant neoplasm of brain, unspecified: Secondary | ICD-10-CM

## 2011-12-09 LAB — CBC WITH DIFFERENTIAL (CANCER CENTER ONLY)
BASO#: 0 10*3/uL (ref 0.0–0.2)
Eosinophils Absolute: 0.1 10*3/uL (ref 0.0–0.5)
HCT: 39.9 % (ref 38.7–49.9)
LYMPH%: 21.4 % (ref 14.0–48.0)
MCH: 28.6 pg (ref 28.0–33.4)
MCV: 86 fL (ref 82–98)
MONO#: 0.3 10*3/uL (ref 0.1–0.9)
MONO%: 7.5 % (ref 0.0–13.0)
NEUT%: 68.9 % (ref 40.0–80.0)
RBC: 4.62 10*6/uL (ref 4.20–5.70)
WBC: 4 10*3/uL (ref 4.0–10.0)

## 2011-12-09 LAB — CHCC SATELLITE - SMEAR

## 2011-12-09 LAB — COMPREHENSIVE METABOLIC PANEL
Alkaline Phosphatase: 68 U/L (ref 39–117)
BUN: 10 mg/dL (ref 6–23)
CO2: 24 mEq/L (ref 19–32)
Creatinine, Ser: 1.29 mg/dL (ref 0.50–1.35)
Glucose, Bld: 85 mg/dL (ref 70–99)
Total Bilirubin: 0.3 mg/dL (ref 0.3–1.2)
Total Protein: 6.1 g/dL (ref 6.0–8.3)

## 2011-12-09 LAB — LACTATE DEHYDROGENASE: LDH: 142 U/L (ref 94–250)

## 2011-12-09 NOTE — Progress Notes (Signed)
This office note has been dictated.

## 2011-12-10 ENCOUNTER — Ambulatory Visit
Admission: RE | Admit: 2011-12-10 | Discharge: 2011-12-10 | Disposition: A | Discharge status: Home / Self Care | Payer: BC Managed Care – PPO | Source: Ambulatory Visit | Attending: Radiation Oncology | Admitting: Radiation Oncology

## 2011-12-10 NOTE — Progress Notes (Signed)
CC:   Nathan Simmonds, MD Nathan Prince, M.D.  DIAGNOSIS:  Grade 3, oligoastrocytoma-resected-left temporal lobe.  CURRENT THERAPY:  The patient is receiving radiation therapy with concurrent Temodar.  INTERVAL HISTORY:  Nathan Prince comes in for followup. He looks great. He has really done well with treatment.  He is 8 more treatments left.  He has not had any problems so far.  He has had no nausea or vomiting. His taste for food has decreased somewhat.  He says that he does not like eating meat that much, which is understandable.  He has had no problems with diarrhea.  He has had no rashes.  He is on Bactrim to help prevent pneumonia.  He has had no cough or shortness breath.  He has not noticed any leg swelling.  PHYSICAL EXAM:  General: This is a well-developed, well-nourished, white gentleman in no obvious distress.  Vital Signs: Temperature 96.8, pulse 94, respiratory rate 18, blood pressure 118/70, and weight is 184.  Head and neck exam shows a normocephalic, atraumatic skull.  There are no ocular or oral lesions.  There are no palpable cervical or supraclavicular lymph nodes.  Lungs are clear to percussion and auscultation bilaterally.  He has no rales, wheezes, or rhonchi. Cardiac examination:  Regular rate and rhythm with a normal S1 and S2. There are no murmurs, rubs, or bruits.  Abdominal exam: Soft with good bowel sounds.  There is no fluid wave.  There is no palpable hepatosplenomegaly.  Back exam:  No tenderness over the spine, ribs, or hips.  Extremities: Shows no clubbing, cyanosis, or edema.  He has good strength bilaterally.  Neurologic: Cranial exam does show the left craniotomy site which is healing nicely.  He has some slight erythema secondary to radiation.  LABORATORY STUDIES:  White cell count is 4, hemoglobin 13.2, hematocrit 40, and platelet count 183.  IMPRESSION:  Nathan Prince is a 46 year old gentleman with history of grade 3 oligoastrocytoma.   This was resected out at [facility].  He now is on adjuvant chemo/radiation therapy.  He will finish up his treatments next week.  I probably would not plan for a followup MRI for at least 6 weeks after completion of radiation therapy.  I want to make sure that we do not run into a problem with "pseudogrowth" which can be seen on MRI, if it is too soon after radiation.  I want him to continue on the Bactrim even though he finished his Temodar.  I think his immune system still will be compromised by Temodar for a while.  We will go ahead and plan to see Nathan Prince back in about a month.  At that point in time, we will get his scans all set up.    ______________________________ Josph Macho, M.D. PRE/MEDQ  D:  12/09/2011  T:  12/09/2011  Job:  [id number]

## 2011-12-11 ENCOUNTER — Ambulatory Visit: Payer: BC Managed Care – PPO

## 2011-12-11 ENCOUNTER — Ambulatory Visit
Admission: RE | Admit: 2011-12-11 | Discharge: 2011-12-11 | Disposition: A | Discharge status: Home / Self Care | Payer: BC Managed Care – PPO | Source: Ambulatory Visit | Attending: Radiation Oncology | Admitting: Radiation Oncology

## 2011-12-12 ENCOUNTER — Ambulatory Visit
Admission: RE | Admit: 2011-12-12 | Discharge: 2011-12-12 | Disposition: A | Discharge status: Home / Self Care | Payer: BC Managed Care – PPO | Source: Ambulatory Visit | Attending: Radiation Oncology | Admitting: Radiation Oncology

## 2011-12-13 ENCOUNTER — Encounter: Payer: Self-pay | Admitting: Radiation Oncology

## 2011-12-13 ENCOUNTER — Ambulatory Visit
Admission: RE | Admit: 2011-12-13 | Discharge: 2011-12-13 | Disposition: A | Discharge status: Home / Self Care | Payer: BC Managed Care – PPO | Source: Ambulatory Visit | Attending: Radiation Oncology | Admitting: Radiation Oncology

## 2011-12-13 VITALS — BP 112/83 | HR 81 | Temp 98.1°F | Wt 184.1 lb

## 2011-12-13 DIAGNOSIS — G9389 Other specified disorders of brain: Secondary | ICD-10-CM

## 2011-12-13 NOTE — Progress Notes (Signed)
Reports "a little more" blurred  Vision in left eye since starting treatment.   Denies ataxia, nausea/vomiting but some queasiness, fatigue, nor headaches but states he does not eat as well presently.  "I eat out of duty".  Meat does not appeal to him at all.

## 2011-12-13 NOTE — Progress Notes (Signed)
   Weekly Management Note Current Dose:  5220 cGy  Projected Dose: 5940 cGy   Narrative:  The patient presents for routine under treatment assessment.  CBCT/MVCT images/Port film x-rays were reviewed.  The chart was checked. He reports fatigue and poor appetite. His eye is a little blurry on the left side. No excessive tearing. He is taking Temodar. He is in fairly good spirits today. No headaches or vomiting. He is no longer on Decadron.  Physical Findings: Weight: 83.507 kg (184 lb 1.6 oz). He has alopecia over the left temporal region. No oral thrush.  CBC    Component Value Date/Time   WBC 4.0 12/09/2011 1349   WBC 11.1* 09/05/2011 0402   RBC 4.91 09/05/2011 0402   HGB 13.2 12/09/2011 1349   HGB 13.7 09/05/2011 0402   HCT 39.9 12/09/2011 1349   HCT 42.1 09/05/2011 0402   PLT 183 12/09/2011 1349   PLT 143* 09/05/2011 0402   MCV 86 12/09/2011 1349   MCV 85.7 09/05/2011 0402   MCH 28.6 12/09/2011 1349   MCH 27.9 09/05/2011 0402   MCHC 33.1 12/09/2011 1349   MCHC 32.5 09/05/2011 0402   RDW 13.8 12/09/2011 1349   RDW 13.1 09/05/2011 0402   LYMPHSABS 0.9 12/09/2011 1349   EOSABS 0.1 12/09/2011 1349   BASOSABS 0.0 12/09/2011 1349     Impression:  The patient is tolerating radiotherapy.  Plan:  Continue radiotherapy as planned.

## 2011-12-16 ENCOUNTER — Ambulatory Visit
Admission: RE | Admit: 2011-12-16 | Discharge: 2011-12-16 | Disposition: A | Discharge status: Home / Self Care | Payer: BC Managed Care – PPO | Source: Ambulatory Visit | Attending: Radiation Oncology | Admitting: Radiation Oncology

## 2011-12-17 ENCOUNTER — Ambulatory Visit
Admission: RE | Admit: 2011-12-17 | Discharge: 2011-12-17 | Disposition: A | Discharge status: Home / Self Care | Payer: BC Managed Care – PPO | Source: Ambulatory Visit | Attending: Radiation Oncology | Admitting: Radiation Oncology

## 2011-12-18 ENCOUNTER — Ambulatory Visit
Admission: RE | Admit: 2011-12-18 | Discharge: 2011-12-18 | Disposition: A | Discharge status: Home / Self Care | Payer: BC Managed Care – PPO | Source: Ambulatory Visit | Attending: Radiation Oncology | Admitting: Radiation Oncology

## 2011-12-19 ENCOUNTER — Ambulatory Visit
Admission: RE | Admit: 2011-12-19 | Discharge: 2011-12-19 | Disposition: A | Discharge status: Home / Self Care | Payer: BC Managed Care – PPO | Source: Ambulatory Visit | Attending: Radiation Oncology | Admitting: Radiation Oncology

## 2011-12-19 ENCOUNTER — Encounter: Payer: Self-pay | Admitting: Radiation Oncology

## 2011-12-19 VITALS — Wt 182.2 lb

## 2011-12-19 DIAGNOSIS — C719 Malignant neoplasm of brain, unspecified: Secondary | ICD-10-CM

## 2011-12-19 NOTE — Progress Notes (Signed)
  Radiation Oncology         [phone] ________________________________  Name: Nathan Prince MRN: [id number]  Date: 12/19/2011  DOB: Sep 02, 1966  Weekly Radiation Therapy Management  Current Dose: 59.4 Gy     Planned Dose:  59.4 Gy  Narrative . . . . . . . . The patient presents for his final routine under treatment assessment.                                                      The patient is noticing some diminished appetite. He also notes some change in the right eye visual acuity but these have not changed significantly over the past few weeks..                                 Megavoltage CT Set-up films were reviewed.                                 The chart was checked. Physical Findings. . . Weight essentially stable.  No significant changes. Impression . . . . . . . The patient is  tolerating radiation. Plan . . . . . . . . . . . Marland Kitchen complete treatment as planned and followup in one month.  ________________________________  Artist Pais. Kathrynn Running, M.D.

## 2011-12-19 NOTE — Progress Notes (Signed)
Pt has been taking Zofran q8 hr x 3 weeks, currently taking q 12 hrs.  He has felt some nausea/quesiness, no vomiting. He has loss of appetite. Talked about protein, carbs in diet, food choices. Not on steroids at this time. Denies headaches; but has noticed vision changes/blurriness in left eye since last Friday. Sees more clearly in brighter light. Advised not to seek eye appt for changes in prescription at this time. Continues to take Keppra. Advised to apply SPF 30 lotion to skin in tx area and wear hat when outdoors. Pt has plans to go to beach.  Pt has FU appt at [facility] 12/31/11. Completed tx today, has FU card.

## 2011-12-24 ENCOUNTER — Telehealth: Payer: Self-pay | Admitting: *Deleted

## 2011-12-24 ENCOUNTER — Encounter: Payer: Self-pay | Admitting: Radiation Oncology

## 2011-12-24 ENCOUNTER — Encounter: Payer: Self-pay | Admitting: *Deleted

## 2011-12-24 NOTE — Telephone Encounter (Signed)
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

## 2011-12-24 NOTE — Progress Notes (Signed)
Letter  Authorizing patient to return to work half day starting 12/24/11, faxed to [employer] system at patients work at [phone] with attn to [place] per patient, called patients home and left voice message,will mail letter to patients home as well 2:04 PM

## 2011-12-30 NOTE — Progress Notes (Signed)
  Radiation Oncology         [phone] ________________________________  Name: Nathan Prince MRN: [id number]  Date: 12/19/2011  DOB: [birth year]  End of Treatment Note  Diagnosis:   46 year old gentleman with grade 3 oligoastrocytoma of the left temporal brain     Indication for treatment:  Adjuvant, Curative       Radiation treatment dates:   11/04/2011-12/19/2011  Site/dose:   The resection bed and possible residual tumor were treated to 59.4 gray in 33 fractions of 1.8 gray  Beams/energy:   Tomotherapy was used to deliver intensity modulated radiotherapy to the target volume while maintaining nearby optic structures, brain stem, uninvolved brain, and ipsilateral cochlea below radiation tolerance levels. The treatment unit was used for daily image guidance with megavoltage CT prior to each fraction. The patient was immobilized with a thermoplastic immobilization cast  Narrative: The patient tolerated radiation treatment relatively well.   He experienced some visual floaters during radiation which remained essentially unchanged and represent a long-standing symptom. Otherwise, patient remained quite functional during his course of radiation and actually returned to work part-time intermittent basis.  Plan: The patient has completed radiation treatment. The patient will return to radiation oncology clinic for routine followup in one month. I advised them to call or return sooner if they have any questions or concerns related to their recovery or treatment. ________________________________  Artist Pais. Kathrynn Running, M.D.

## 2012-01-07 ENCOUNTER — Other Ambulatory Visit (HOSPITAL_BASED_OUTPATIENT_CLINIC_OR_DEPARTMENT_OTHER): Payer: BC Managed Care – PPO | Admitting: Lab

## 2012-01-07 ENCOUNTER — Ambulatory Visit (HOSPITAL_BASED_OUTPATIENT_CLINIC_OR_DEPARTMENT_OTHER): Payer: BC Managed Care – PPO | Admitting: Hematology & Oncology

## 2012-01-07 VITALS — BP 118/81 | HR 72 | Temp 97.0°F | Wt 188.0 lb

## 2012-01-07 DIAGNOSIS — C712 Malignant neoplasm of temporal lobe: Secondary | ICD-10-CM

## 2012-01-07 DIAGNOSIS — C713 Malignant neoplasm of parietal lobe: Secondary | ICD-10-CM

## 2012-01-07 DIAGNOSIS — C719 Malignant neoplasm of brain, unspecified: Secondary | ICD-10-CM

## 2012-01-07 LAB — CBC WITH DIFFERENTIAL (CANCER CENTER ONLY)
BASO#: 0 10*3/uL (ref 0.0–0.2)
EOS%: 2.7 % (ref 0.0–7.0)
HGB: 14 g/dL (ref 13.0–17.1)
LYMPH#: 0.9 10*3/uL (ref 0.9–3.3)
MCHC: 34.6 g/dL (ref 32.0–35.9)
MONO#: 0.4 10*3/uL (ref 0.1–0.9)
NEUT#: 2.3 10*3/uL (ref 1.5–6.5)
RBC: 4.74 10*6/uL (ref 4.20–5.70)
WBC: 3.8 10*3/uL — ABNORMAL LOW (ref 4.0–10.0)

## 2012-01-07 LAB — COMPREHENSIVE METABOLIC PANEL
ALT: 12 U/L (ref 0–53)
AST: 15 U/L (ref 0–37)
Albumin: 4.5 g/dL (ref 3.5–5.2)
BUN: 12 mg/dL (ref 6–23)
CO2: 24 mEq/L (ref 19–32)
Calcium: 9.6 mg/dL (ref 8.4–10.5)
Chloride: 109 mEq/L (ref 96–112)
Potassium: 4.1 mEq/L (ref 3.5–5.3)

## 2012-01-07 MED ORDER — TEMOZOLOMIDE 100 MG PO CAPS: ORAL_CAPSULE | ORAL | Status: DC

## 2012-01-07 NOTE — Progress Notes (Signed)
This office note has been dictated.

## 2012-01-07 NOTE — Progress Notes (Signed)
CC:   Nathan Simmonds, MD [name], M.D.  DIAGNOSIS:  Grade 3 oligoastrocytoma of the left temporal lobe, resected.  CURRENT THERAPY:  The patient has completed his chemoradiation therapy.  INTERVAL HISTORY:  Nathan Prince comes in for his followup.  He is looking real good.  He was seen out at [facility].  He said he had a an MRI done there.  From what he says, everything looked good with no evidence of recurrence of the oligoastrocytoma.  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  He is going to try to do this.  I do not see a problem with him doing this. His appetite is improving.  His taste for food is improving.  I do think a nutritionist will help him out.  We will make an appointment for him to see a nutritionist at [facility].  He has had no problems with cough.  He has had no double vision or blurred vision.  There has been no dysphasia or odynophagia.  He has had no nausea or vomiting.  He continues on Bactrim for PCP prophylaxis.  PHYSICAL EXAM:  General:  This is a well-developed, well-nourished, white gentleman in no obvious distress.  Vital Signs:  Temperature of 97, pulse 72, respiratory rate 18, blood pressure 118/81.  Weight is 188.  Head and Neck:  Normocephalic, atraumatic skull.  There are no ocular or oral lesions.  There are no palpable cervical or supraclavicular lymph nodes.  He does have the craniotomy scar on the left temporal lobe.  His neck is supple with no palpable lymph nodes. He has good range motion of his neck.  Lungs:  Clear bilaterally. Cardiac Exam:  Regular rate and rhythm with a normal S1 and S2.  There are no murmurs, rubs, or bruits.  Abdominal Exam:  Soft with good bowel sounds.  There is no palpable abdominal mass.  There is no palpable hepatosplenomegaly.  Extremities:  No clubbing, cyanosis, or edema. Neurological:  No focal neurological deficit deficits.  Skin;  no rash, ecchymosis, or petechia.  LABORATORY STUDIES:  White cell  count is 3.8, hemoglobin 14, hematocrit 40.5, platelet count 164.  IMPRESSION:  Nathan Prince is a 46 year old gentleman with a resected oligoastrocytoma of the left temporal lobe.  He is going to start his full-dose chemotherapy in the adjuvant setting. The neuro-oncologist at [facility] wanted him to have a total of 12 cycles of treatment.  We will go ahead and dose him at 100 mg/sq m for his 1st cycle and then 200 mg/sq m for his 2nd through 12th cycle.  As such, his 1st cycle will be 200 mg a day for 5 days.  He goes back to see the neuro-oncologist at [facility] in about 2 months' time.  I will see him back in 1 month at which time he will start his 2nd cycle of full-dose Temodar.  He will continue the Bactrim.  Of note, he is off Decadron now.    ______________________________ Nathan Prince, M.D. PRE/MEDQ  D:  01/07/2012  T:  01/07/2012  Job:  [id number]

## 2012-01-08 ENCOUNTER — Encounter: Payer: Self-pay | Admitting: Radiation Oncology

## 2012-01-08 ENCOUNTER — Telehealth: Payer: Self-pay | Admitting: Hematology & Oncology

## 2012-01-08 NOTE — Telephone Encounter (Signed)
Per order to sch f/u apt and nutrition apt.  apts were sch and mailed out

## 2012-01-14 ENCOUNTER — Encounter: Payer: Self-pay | Admitting: Radiation Oncology

## 2012-01-16 ENCOUNTER — Ambulatory Visit
Admission: RE | Admit: 2012-01-16 | Discharge: 2012-01-16 | Disposition: A | Discharge status: Home / Self Care | Payer: BC Managed Care – PPO | Source: Ambulatory Visit | Attending: Radiation Oncology | Admitting: Radiation Oncology

## 2012-01-16 ENCOUNTER — Encounter: Payer: Self-pay | Admitting: Radiation Oncology

## 2012-01-16 VITALS — BP 122/85 | HR 92 | Temp 99.0°F | Wt 188.9 lb

## 2012-01-16 DIAGNOSIS — G9389 Other specified disorders of brain: Secondary | ICD-10-CM

## 2012-01-16 NOTE — Progress Notes (Signed)
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  Interval Since Last Radiation:  1 months  Narrative:  The patient returns today for routine follow-up.  The patient has returned to work on a part-time basis. He has not had any significant disabilities related to his treatment other than fatigue. The patient's visual symptoms have improved since completing radiation. He is planning to go to the eye doctor this week for a new eyeglass prescription. Otherwise, there are no substantive changes in his status.                              ALLERGIES:   has no known allergies.  Meds: Current Outpatient Prescriptions  Medication Sig Dispense Refill  . acetaminophen (TYLENOL) 650 MG CR tablet Take 650 mg by mouth every 8 (eight) hours as needed.      . Calcium Carbonate-Vitamin D (CALTRATE 600+D) 600-400 MG-UNIT per chew tablet Chew 1 tablet by mouth 2 (two) times daily.      Marland Kitchen docusate sodium (COLACE) 100 MG capsule Take 100 mg by mouth 3 (three) times daily as needed.       . levETIRAcetam (KEPPRA) 500 MG tablet Take 1 tablet (500 mg total) by mouth 2 (two) times daily.  60 tablet  3  . Multiple Vitamins-Minerals (MULTIVITAMINS THER. W/MINERALS) TABS Take 1 tablet by mouth daily.        . OMEGA-3 KRILL OIL PO Take 1 capsule by mouth daily.        . ondansetron (ZOFRAN) 4 MG tablet Take 8 mg by mouth every 8 (eight) hours as needed.       . sulfamethoxazole-trimethoprim (BACTRIM,SEPTRA) 400-80 MG per tablet Take 1 tablet by mouth 2 (two) times daily. 1/2 dose daily      . temozolomide (TEMODAR) 100 MG capsule May take on an empty stomach or at bedtime to decrease nausea &  vomiting. Take 3 capsules a day for 5 days only!!!!  15 capsule  0  . emollient (BIAFINE) cream Apply 1 application topically as needed.      . fish oil-omega-3 fatty acids 1000 MG capsule Take 1 g by mouth daily.         No current facility-administered medications for this encounter.   Facility-Administered Medications Ordered in Other Encounters  Medication Dose Route Frequency Provider Last Rate Last Dose  . topical emolient (BIAFINE) emulsion   Topical PRN Oneita Hurt, MD        Physical Findings: The patient is in no acute distress. Patient is alert and oriented.  weight is 188 lb 14.4 oz (85.684 kg). His temperature is 99 F (37.2 C). His blood pressure is 122/85 and his pulse is 92.   the patient's head is normocephalic and atraumatic with patchy hair loss related to his radiotherapy. Extraocular muscles are intact. Vision is grossly intact. Motor strength is 5/5 in upper and lower extremities. At this light touch noted. Speech is articulate and fluent gait is unremarkable..  No significant changes.  Lab Findings: Lab Results  Component Value Date   WBC 3.8* 01/07/2012   HGB 14.0 01/07/2012   HCT  40.5 01/07/2012   MCV 85 01/07/2012   PLT 164 01/07/2012   Radiographic findings: The patient underwent followup brain MRI at [facility] which simply showed postoperative changes and no evidence of recurrence.  Impression:  The patient is recovering from the effects of radiation.  He is now receiving adjuvant chemotherapy under the care of Dr. Myna Hidalgo  Plan:  We will see him for routine followup in 2 months.  _____________________________________  Artist Pais. Kathrynn Running, M.D.

## 2012-01-16 NOTE — Progress Notes (Signed)
Patient here for routine one month follow up post completion of radiation of brain (left temporal lobe).Has started chemo regime of temodar, Denies pain or nausea.Increased fatigue. Has gone back to teaching part time.Vitals stable except for low grade temp of 99.0.MRI of brain completed 12/31/11 at [facility] reveals post surgical changes without definite evidence of disease progression.

## 2012-01-17 ENCOUNTER — Encounter: Payer: Self-pay | Admitting: Radiation Oncology

## 2012-01-23 ENCOUNTER — Encounter: Payer: Self-pay | Admitting: Nutrition

## 2012-01-23 ENCOUNTER — Telehealth: Payer: Self-pay | Admitting: Nutrition

## 2012-01-23 NOTE — Telephone Encounter (Signed)
Patient called to cancel his nutrition appointment.  He will call and reschedule at a later date.

## 2012-01-28 ENCOUNTER — Ambulatory Visit: Payer: Self-pay | Admitting: Nutrition

## 2012-01-28 NOTE — Progress Notes (Signed)
Encounter addended by: Lowella Petties, RN on: 01/28/2012 10:38 AM<BR>     Documentation filed: Charges VN

## 2012-02-04 ENCOUNTER — Other Ambulatory Visit (HOSPITAL_BASED_OUTPATIENT_CLINIC_OR_DEPARTMENT_OTHER): Payer: BC Managed Care – PPO | Admitting: Lab

## 2012-02-04 ENCOUNTER — Ambulatory Visit (HOSPITAL_BASED_OUTPATIENT_CLINIC_OR_DEPARTMENT_OTHER): Payer: BC Managed Care – PPO | Admitting: Hematology & Oncology

## 2012-02-04 VITALS — BP 119/75 | HR 85 | Temp 97.0°F | Ht 70.0 in | Wt 188.0 lb

## 2012-02-04 DIAGNOSIS — C713 Malignant neoplasm of parietal lobe: Secondary | ICD-10-CM

## 2012-02-04 DIAGNOSIS — C712 Malignant neoplasm of temporal lobe: Secondary | ICD-10-CM

## 2012-02-04 LAB — CBC WITH DIFFERENTIAL (CANCER CENTER ONLY)
BASO%: 1.2 % (ref 0.0–2.0)
EOS%: 0.9 % (ref 0.0–7.0)
HGB: 14.3 g/dL (ref 13.0–17.1)
LYMPH#: 0.8 10*3/uL — ABNORMAL LOW (ref 0.9–3.3)
MCHC: 34.2 g/dL (ref 32.0–35.9)
NEUT#: 2.1 10*3/uL (ref 1.5–6.5)
RDW: 13.1 % (ref 11.1–15.7)

## 2012-02-04 LAB — COMPREHENSIVE METABOLIC PANEL
ALT: 17 U/L (ref 0–53)
AST: 16 U/L (ref 0–37)
Albumin: 4.7 g/dL (ref 3.5–5.2)
Calcium: 9.6 mg/dL (ref 8.4–10.5)
Chloride: 107 mEq/L (ref 96–112)
Potassium: 4.6 mEq/L (ref 3.5–5.3)
Sodium: 141 mEq/L (ref 135–145)

## 2012-02-04 MED ORDER — TEMOZOLOMIDE 100 MG PO CAPS: 400.0000 mg | ORAL_CAPSULE | Freq: Every day | ORAL | Status: DC

## 2012-02-04 MED ORDER — TEMOZOLOMIDE 100 MG PO CAPS: 400.0000 mg | ORAL_CAPSULE | Freq: Every day | ORAL | Status: AC

## 2012-02-04 NOTE — Progress Notes (Signed)
This office note has been dictated.

## 2012-02-04 NOTE — Progress Notes (Signed)
Addended by: Arlan Organ R on: 02/04/2012 02:29 PM   Modules accepted: Orders

## 2012-02-04 NOTE — Progress Notes (Signed)
CC:   [name], MD [name], M.D.  DIAGNOSIS:  Grade 3 oligoastrocytoma of the left temporal lobe, resected.  CURRENT THERAPY:  The patient is on adjuvant chemotherapy with Temodar.  INTERIM HISTORY:  Mr. Reckart comes in for followup.  He has had 1 cycle of adjuvant chemotherapy.  He, of note, did complete his chemoradiation therapy a couple of months or so ago.  He is now on chemotherapy with Temodar to complete a year of adjuvant therapy.  He did well with the 1st month of Temodar.  He is working.  He had no problems over Memorial Day weekend.  He has had no nausea or vomiting. He had 1 episode of emesis but he thinks this might have been related to his medication.  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  He has had no bleeding. He has had no change in bowel or bladder habits. He has had no leg swelling.  He has had no rashes.  He has had no double vision or blurred vision.  There have been no mouth sores.  He continues on Bactrim for Pneumocystis prophylaxis.  PHYSICAL EXAMINATION:  This is a well-developed, well-nourished white gentleman in no obvious distress.  Vital signs:  97, pulse 85, respiratory rate 18, blood pressure 119/75.  Weight is 188.  Head and neck:  Normocephalic, atraumatic skull.  He does have the craniotomy scar in the left temporal lesion region.  He does have some areas of alopecia where he had past radiation.  He has no mucositis.  There is no adenopathy in his neck.  Lungs:  Clear bilaterally.  Cardiac:  Regular rate and rhythm with a normal S1, S2.  There are no murmurs, rubs or bruits.  Abdomen:  Soft with good bowel sounds.  There is no palpable abdominal mass.  There is no palpable hepatosplenomegaly.  Back:  No tenderness over the spine, ribs, or hips.  Extremities:  No clubbing, cyanosis or edema.  Neurologic:  No focal neurological deficits.  LABORATORY STUDIES:  White cell count 3.5, hemoglobin 14.3,  hematocrit 41.8, platelet count 182.  IMPRESSION:  Mr. 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  This was resected.  He did receive adjuvant chemoradiation therapy.  He now is on full-dose chemotherapy in the adjuvant setting.  We will move him up to 400 mg a day for 5 days.  We will get him back in 1 more month.  He goes to [facility] next month.  He has an MRI done there.    ______________________________ Josph Macho, M.D. PRE/MEDQ  D:  02/04/2012  T:  02/04/2012  Job:  [id number]

## 2012-02-04 NOTE — Progress Notes (Signed)
Addended by: Arlan Organ R on: 02/04/2012 01:18 PM   Modules accepted: Orders

## 2012-02-17 ENCOUNTER — Other Ambulatory Visit: Payer: Self-pay | Admitting: *Deleted

## 2012-02-17 MED ORDER — DEXAMETHASONE 4 MG PO TABS: 4.0000 mg | ORAL_TABLET | Freq: Two times a day (BID) | ORAL | Status: AC

## 2012-02-17 NOTE — Telephone Encounter (Signed)
Pt's wife called with c/o personality changes. She spoke to Dr Myna Hidalgo personally who resumed Decadron 4 mg BID. Will send via eprescribe to [facility] in [place]. Will also contact Terri Moore-Painter as a resource.

## 2012-02-18 ENCOUNTER — Encounter: Payer: Self-pay | Admitting: Specialist

## 2012-02-18 NOTE — Progress Notes (Signed)
At the request of Amy in Dr. Gustavo Lah office, I contacted Nathan Prince to offer the services provided by the Patient and Family Support Center at the [facility].  Nathan Prince said that she and her husband had an appointment today to see a Librarian, academic.  She said that their faith is what is enabling them to cope with their situation.  I told her about a Caregiver Support Group beginning at the [facility] next week, and we also discussed [facility] as a resource for her teenagers.  She indicated she would be back in touch with me if she wants to participate in the support group or if she feels she needs additional support services.

## 2012-03-02 ENCOUNTER — Ambulatory Visit (HOSPITAL_BASED_OUTPATIENT_CLINIC_OR_DEPARTMENT_OTHER): Payer: BC Managed Care – PPO | Admitting: Hematology & Oncology

## 2012-03-02 ENCOUNTER — Other Ambulatory Visit (HOSPITAL_BASED_OUTPATIENT_CLINIC_OR_DEPARTMENT_OTHER): Payer: BC Managed Care – PPO | Admitting: Lab

## 2012-03-02 VITALS — BP 115/69 | HR 98 | Temp 97.0°F | Ht 70.0 in | Wt 181.0 lb

## 2012-03-02 DIAGNOSIS — C712 Malignant neoplasm of temporal lobe: Secondary | ICD-10-CM

## 2012-03-02 DIAGNOSIS — C719 Malignant neoplasm of brain, unspecified: Secondary | ICD-10-CM

## 2012-03-02 DIAGNOSIS — F329 Major depressive disorder, single episode, unspecified: Secondary | ICD-10-CM

## 2012-03-02 LAB — CBC WITH DIFFERENTIAL (CANCER CENTER ONLY)
BASO#: 0 10*3/uL (ref 0.0–0.2)
EOS%: 1.8 % (ref 0.0–7.0)
HGB: 14.1 g/dL (ref 13.0–17.1)
MCH: 28.8 pg (ref 28.0–33.4)
MCHC: 33.9 g/dL (ref 32.0–35.9)
MONO%: 9.7 % (ref 0.0–13.0)
NEUT#: 2.9 10*3/uL (ref 1.5–6.5)
Platelets: 143 10*3/uL — ABNORMAL LOW (ref 145–400)

## 2012-03-02 LAB — COMPREHENSIVE METABOLIC PANEL
AST: 18 U/L (ref 0–37)
Albumin: 4.7 g/dL (ref 3.5–5.2)
Alkaline Phosphatase: 64 U/L (ref 39–117)
BUN: 15 mg/dL (ref 6–23)
Potassium: 4.3 mEq/L (ref 3.5–5.3)
Total Bilirubin: 0.5 mg/dL (ref 0.3–1.2)

## 2012-03-02 MED ORDER — TEMOZOLOMIDE 100 MG PO CAPS: ORAL_CAPSULE | ORAL | Status: DC

## 2012-03-02 NOTE — Progress Notes (Signed)
This office note has been dictated.

## 2012-03-03 ENCOUNTER — Other Ambulatory Visit: Payer: Self-pay | Admitting: Radiation Oncology

## 2012-03-03 NOTE — Progress Notes (Signed)
CC:   [name], M.D. [name], MD [facility], Fax [phone]  DIAGNOSIS:  Grade 3 oligoastrocytoma of the left temporal lobe.  CURRENT THERAPY:  The patient is status post 2 cycles of adjuvant Temodar.  INTERIM HISTORY:  Mr. Straughter comes in for followup.  He now is on some Zoloft. He has been having some elements of depression.  He saw Dr. Noe Gens out at [facility].  She did put him on some Celebrex at 50 mg a day. He has only been taking it for about 4 days now.  I told him that he needs to be patient with taking it and it will work.  We can certainly increase the dose if necessary.  He did have an MRI done.  The MRI showed continued improvement in the resection cavity.  There are no areas of enhancement that would suggest residual or recurrent disease.  He had no surrounding edema.  He is still having some difficulties with word-finding.  This, hopefully, will improve.  He is being checked for the P53 gene mutation.  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  He is being checked for the Li-Fraumeni syndrome.  He has had no nausea or vomiting.  His appetite is okay.  He has had no diarrhea.  He has had no leg swelling.  He continues on Bactrim for DVT prophylaxis.  PHYSICAL EXAMINATION:  This is a thin, well-developed, well-nourished white gentleman in no obvious distress.  Vital signs:  Temperature of 97, pulse 90, respiratory rate 18, blood pressure 115/69.  Weight is 181.  Head and neck:  Normocephalic, atraumatic skull.  There are no ocular or oral lesions.  There are no palpable cervical or supraclavicular lymph nodes.  Lungs:  Clear to percussion and auscultation bilaterally.  Cardiac:  Regular rate and rhythm with a normal S1 and S2.  There are no murmurs, rubs or bruits.  Abdomen:  Soft with good bowel sounds.  There is no palpable abdominal mass.  There is no palpable hepatosplenomegaly.  Extremities:  No clubbing, cyanosis or edema.   Neurological:  No focal neurological deficits.  He does have the healed craniotomy scar.  LABORATORY STUDIES:  White cell count is 4.5, hemoglobin 14, hematocrit 42, platelet count 143.  IMPRESSION:  Mr. Linch is a 46 year old gentleman with a resected oligoastrocytoma of the left temporal lobe.  This is grade 3.  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  He is MGMT negative.  He underwent initial chemoradiation therapy.  He did well with this.  He now is on single agent Temodar.  I will plan to get back in 1 more month for his 4th cycle of treatment.  Hopefully, the Zoloft will help him.    ______________________________ Josph Macho, M.D. PRE/MEDQ  D:  03/02/2012  T:  03/03/2012  Job:  [id number]

## 2012-03-06 ENCOUNTER — Other Ambulatory Visit: Payer: Self-pay | Admitting: Lab

## 2012-03-06 ENCOUNTER — Ambulatory Visit: Payer: Self-pay | Admitting: Hematology & Oncology

## 2012-03-15 ENCOUNTER — Other Ambulatory Visit: Payer: Self-pay | Admitting: Hematology & Oncology

## 2012-03-17 ENCOUNTER — Telehealth: Payer: Self-pay | Admitting: Hematology & Oncology

## 2012-03-17 NOTE — Telephone Encounter (Signed)
Pt's wife called and cx 03/31/12 apt due to going out of town.  She resch apt for 04/03/12

## 2012-03-31 ENCOUNTER — Other Ambulatory Visit: Payer: Self-pay | Admitting: Lab

## 2012-03-31 ENCOUNTER — Ambulatory Visit: Payer: Self-pay | Admitting: Hematology & Oncology

## 2012-04-03 ENCOUNTER — Ambulatory Visit (HOSPITAL_BASED_OUTPATIENT_CLINIC_OR_DEPARTMENT_OTHER): Payer: BC Managed Care – PPO | Admitting: Hematology & Oncology

## 2012-04-03 ENCOUNTER — Other Ambulatory Visit (HOSPITAL_BASED_OUTPATIENT_CLINIC_OR_DEPARTMENT_OTHER): Payer: BC Managed Care – PPO | Admitting: Lab

## 2012-04-03 ENCOUNTER — Telehealth: Payer: Self-pay | Admitting: Hematology & Oncology

## 2012-04-03 VITALS — BP 115/75 | HR 58 | Temp 97.0°F | Ht 70.0 in | Wt 183.0 lb

## 2012-04-03 DIAGNOSIS — C719 Malignant neoplasm of brain, unspecified: Secondary | ICD-10-CM

## 2012-04-03 DIAGNOSIS — C712 Malignant neoplasm of temporal lobe: Secondary | ICD-10-CM

## 2012-04-03 LAB — COMPREHENSIVE METABOLIC PANEL
AST: 18 U/L (ref 0–37)
Albumin: 4.6 g/dL (ref 3.5–5.2)
Alkaline Phosphatase: 64 U/L (ref 39–117)
BUN: 12 mg/dL (ref 6–23)
Creatinine, Ser: 1.13 mg/dL (ref 0.50–1.35)
Glucose, Bld: 70 mg/dL (ref 70–99)
Potassium: 4.3 mEq/L (ref 3.5–5.3)
Total Bilirubin: 0.4 mg/dL (ref 0.3–1.2)

## 2012-04-03 LAB — CBC WITH DIFFERENTIAL (CANCER CENTER ONLY)
BASO#: 0 10*3/uL (ref 0.0–0.2)
Eosinophils Absolute: 0.1 10*3/uL (ref 0.0–0.5)
HCT: 43 % (ref 38.7–49.9)
HGB: 14.9 g/dL (ref 13.0–17.1)
LYMPH#: 1.1 10*3/uL (ref 0.9–3.3)
MCH: 29.8 pg (ref 28.0–33.4)
MONO%: 11.9 % (ref 0.0–13.0)
NEUT#: 2.6 10*3/uL (ref 1.5–6.5)
NEUT%: 59.9 % (ref 40.0–80.0)
RBC: 5 10*6/uL (ref 4.20–5.70)

## 2012-04-03 MED ORDER — TEMOZOLOMIDE 100 MG PO CAPS: ORAL_CAPSULE | ORAL | Status: DC

## 2012-04-03 MED ORDER — METHYLPHENIDATE HCL 10 MG PO TABS: 10.0000 mg | ORAL_TABLET | Freq: Two times a day (BID) | ORAL | Status: DC

## 2012-04-03 NOTE — Progress Notes (Signed)
CC:   [name], MD [name], M.D.  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  CURRENT THERAPY:  Patient is status post 3 cycles of adjuvant Temodar.  INTERIM HISTORY:  Mr. Marchant comes in for followup.  He is looking better.  He seems to be more alert.  He still has the episodes of getting frustrated.  He is on, I think, Zoloft.  He is seeing a psychiatrist.  A psychiatrist recommended Ritalin.  I think this is a good idea.  We will put him on 10 mg twice a day.  He has had no nausea or vomiting.  He and his family just got back from [place].  They were on vacation visiting his wife's parents. He has had no fever.  There has been no bleeding.  He has had no cough. He has had no rashes.  He continues on Bactrim for PCP prophylaxis.  PHYSICAL EXAMINATION:  This is a well-developed, well-nourished white gentleman in no obvious distress.  Vital signs:  97, pulse 58, respiratory rate 18, blood pressure 115/75.  Weight is 183.  Head and neck:  Normocephalic, atraumatic skull.  There are no ocular or oral lesions.  There are no palpable cervical or supraclavicular lymph nodes. Lungs:  Clear bilaterally.  Cardiac:  Regular rate and rhythm with normal S1, S2.  There are no murmurs, rubs or bruits.  Abdomen:  Soft with good bowel sounds.  There is no palpable abdominal mass.  There is no fluid wave.  There is no palpable hepatosplenomegaly.  Extremities: Shows no clubbing, cyanosis or edema.  He has good strength in upper and lower extremities.  Neurological:  Shows no focal neurological deficits.  LABORATORIES:  White count 4.4, hemoglobin 14.9, hematocrit 43, platelet count 159.  IMPRESSION:  Mr. Pembleton is a 46 year old gentleman with a grade 3 oligoastrocytoma.  This was resected at [facility].  He completed adjuvant chemoradiation therapy without any problems.  He is now on full-dose Temodar.  I am just glad to see that he is getting back to his  baseline. He still has a ways to go but he is making progress.  We talked a long time about trying not to get frustrated and just trying to continue to pace himself and know that there will be plateaus that he will reach and that he will move through.  Hopefully, the Ritalin will help.  He is on Zoloft.  This, I think, also is helpful.  He is on Keppra.  He has had no problem with seizures.  We will plan to get back in 1 more month.  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    ______________________________ Josph Macho, M.D. PRE/MEDQ  D:  04/03/2012  T:  04/03/2012  Job:  [id number]

## 2012-04-03 NOTE — Progress Notes (Signed)
This office note has been dictated.

## 2012-04-03 NOTE — Telephone Encounter (Signed)
Faxed completed authorization request form to [employer] for review concerning patient's methylphenidate HCL.

## 2012-04-06 ENCOUNTER — Ambulatory Visit: Payer: BC Managed Care – PPO | Admitting: Radiation Oncology

## 2012-04-07 ENCOUNTER — Telehealth: Payer: Self-pay | Admitting: *Deleted

## 2012-04-07 NOTE — Telephone Encounter (Signed)
CALLED PATIENT TO ALTER FU TIME FOR 3:30 PM ON 04-09-12 DUE TO DR. MANNING BEING IN AN SRS CASE, SPOKE WITH PT. AND HE AGREED TO NEW TIME.

## 2012-04-09 ENCOUNTER — Ambulatory Visit
Admission: RE | Admit: 2012-04-09 | Discharge: 2012-04-09 | Disposition: A | Discharge status: Home / Self Care | Payer: BC Managed Care – PPO | Source: Ambulatory Visit | Attending: Radiation Oncology | Admitting: Radiation Oncology

## 2012-04-09 ENCOUNTER — Ambulatory Visit: Payer: BC Managed Care – PPO | Admitting: Radiation Oncology

## 2012-04-09 ENCOUNTER — Encounter: Payer: Self-pay | Admitting: Radiation Oncology

## 2012-04-09 VITALS — BP 109/75 | HR 61 | Temp 97.7°F | Resp 18 | Wt 184.3 lb

## 2012-04-09 DIAGNOSIS — Z79899 Other long term (current) drug therapy: Secondary | ICD-10-CM | POA: Insufficient documentation

## 2012-04-09 DIAGNOSIS — Z510 Encounter for antineoplastic radiation therapy: Secondary | ICD-10-CM | POA: Insufficient documentation

## 2012-04-09 DIAGNOSIS — C719 Malignant neoplasm of brain, unspecified: Secondary | ICD-10-CM

## 2012-04-09 DIAGNOSIS — C712 Malignant neoplasm of temporal lobe: Secondary | ICD-10-CM | POA: Insufficient documentation

## 2012-04-09 NOTE — Progress Notes (Addendum)
HERE TODAY FOR FU OF ASTROCYTOMA WITH COMPLETION OF RADIATION.  CURRENTLY GETTING CHEMO , TEMODAR 400MG  DAILY X5 DAYS THEN OFF FOR 23 DAYS.  WILL BE TAKING THIS UNTIL April OF 2014.  SAYS EVERYTHING IS "GOOD".  HE SAYS HIS VISION IS GOOD NOW AND WENT TO GET CONTACTS YESTERDAY, STILL HAS BLIND SPOT.  CHEMO CAUSES FATIGUE.Marland KitchenMarland Kitchen                          [employer] [place]!!!!!!!!!!!!!!!

## 2012-04-10 ENCOUNTER — Encounter: Payer: Self-pay | Admitting: Radiation Oncology

## 2012-04-10 NOTE — Progress Notes (Signed)
  Radiation Oncology         [phone]) [phone] ________________________________  Name: Nathan Prince MRN: [id number]  Date: 04/09/2012  DOB: 04-16-1966  Follow-Up Visit Note  CC: Lonia Blood, MD  Karn Cassis, MD  Diagnosis:   46 year old gentleman with grade 3 oligoastrocytoma of the left temporal brain s/p resection and adjuvant radiation treatment 11/04/2011-12/19/2011 to 59.4 Gy with TomoTherapy  Interval Since Last Radiation:  3 months  Narrative:  The patient returns today for routine follow-up.  He is without complaint.  He is going back to work in the Fall and his vision is better with new contacts, other than a small field cut which is unchanged.                              ALLERGIES:   has no known allergies.  Meds: Current Outpatient Prescriptions  Medication Sig Dispense Refill  . acetaminophen (TYLENOL) 650 MG CR tablet Take 650 mg by mouth every 8 (eight) hours as needed.      . Calcium Carbonate-Vitamin D (CALTRATE 600+D) 600-400 MG-UNIT per chew tablet Chew 1 tablet by mouth 2 (two) times daily.      Marland Kitchen docusate sodium (COLACE) 100 MG capsule Take 100 mg by mouth 3 (three) times daily as needed.       . levETIRAcetam (KEPPRA) 500 MG tablet TAKE 1 TABLET (500 MG TOTAL) BY MOUTH 2 (TWO) TIMES DAILY.  60 tablet  3  . methylphenidate (RITALIN) 10 MG tablet Take 1 tablet (10 mg total) by mouth 2 (two) times daily.  60 tablet  0  . Multiple Vitamins-Minerals (MULTIVITAMINS THER. W/MINERALS) TABS Take 1 tablet by mouth daily.        . OMEGA-3 KRILL OIL PO Take 1 capsule by mouth daily.        . ondansetron (ZOFRAN) 8 MG tablet TAKE 1 TABLET (8 MG TOTAL) BY MOUTH EVERY 8 (EIGHT) HOURS AS NEEDED FOR NAUSEA.  30 tablet  3  . sertraline (ZOLOFT) 50 MG tablet Take 50 mg by mouth daily.      Marland Kitchen sulfamethoxazole-trimethoprim (BACTRIM,SEPTRA) 400-80 MG per tablet Take 1 tablet by mouth daily. 1/2 dose daily      . temozolomide (TEMODAR) 100 MG capsule May take on an empty stomach or at  bedtime to decrease nausea & vomiting.Takes 4 100 mg.for 5 days then off for 28 days  20 capsule  8   No current facility-administered medications for this encounter.   Facility-Administered Medications Ordered in Other Encounters  Medication Dose Route Frequency Provider Last Rate Last Dose  . topical emolient (BIAFINE) emulsion   Topical PRN Oneita Hurt, MD        Physical Findings: The patient is in no acute distress. Patient is alert and oriented.  weight is 184 lb 4.8 oz (83.598 kg). His oral temperature is 97.7 F (36.5 C). His blood pressure is 109/75 and his pulse is 61. His respiration is 18. Marland Kitchen  He still has radiation epilation on the left temple.  No significant changes.  Impression:  The patient is recovering from the effects of radiation.  He has no evidence of progressive disease per MRIs at [facility] and is clinically stable.  Plan:  Follow up in 4 months.  _____________________________________  Artist Pais. Kathrynn Running, M.D.

## 2012-04-13 ENCOUNTER — Telehealth: Payer: Self-pay | Admitting: Nutrition

## 2012-04-13 NOTE — Telephone Encounter (Signed)
Patient's wife called and wanted to reschedule nutrition appointment.  I have called her back and left a message that I am happy to see patient and answer any nutrition related questions.  Wife has my phone number to return call.

## 2012-04-16 ENCOUNTER — Telehealth: Payer: Self-pay | Admitting: Nutrition

## 2012-04-16 NOTE — Telephone Encounter (Signed)
Scheduled patient for nutrition consult on Friday, April 17, 2012.

## 2012-04-17 ENCOUNTER — Ambulatory Visit: Payer: BC Managed Care – PPO | Admitting: Nutrition

## 2012-04-17 NOTE — Assessment & Plan Note (Signed)
Nathan Prince is a 46 year old male patient of Dr. Gustavo Lah diagnosed with brain cancer status post resection and adjuvant radiation therapy. He is still taking Temodar.    History includes depression.  Medications include Temodar, calcium, vitamin D, multivitamin, Zoloft and Ritalin.  Labs were reviewed.  Height is 70 inches, weight 184.3 pounds.  Usual body weight 185-190 pounds.  BMI is 26.44.    The patient and wife interested in healthy diet information to prevent recurrence of cancer as well as general health. The patient reports he is not really interested in food at this time. He has a poor appetite, however, he does try to eat foods that he has tolerated in the past.  He had questions about diet soft drinks as well as ways to incorporate healthier patterns into his busy lifestyle.  NUTRITION DIAGNOSIS:  Food and nutrition related knowledge deficit related to diagnosis of brain cancer and associated treatments as evidenced by no prior need for nutrition related information.  INTERVENTION:  I have educated Nathan Prince and his wife on the importance of moving towards a plant based diet with lean protein sources and less processed food.  We discussed ways that they could reduce overall sugar in their diet as well as processed foods.  I have given him specific suggestions on incorporating easy breakfasts and lunches for his busy lifestyle.  I provided information on plant based diet, organic food and antioxidants for the patient and I have answered their questions.  MONITORING/EVALUATION (GOALS):  The patient will tolerate a healthy plant based diet to maintain weight and reduce chance of recurrence.  NEXT VISIT:  The patient and family will call with questions.   ______________________________ Zenovia Jarred, RD, CSO, LDN Clinical Nutrition Specialist BN/MEDQ  D:  04/17/2012  T:  04/17/2012  Job:  [id number]

## 2012-05-01 ENCOUNTER — Other Ambulatory Visit: Payer: Self-pay

## 2012-05-04 ENCOUNTER — Other Ambulatory Visit: Payer: Self-pay | Admitting: Lab

## 2012-05-04 ENCOUNTER — Ambulatory Visit: Payer: Self-pay | Admitting: Hematology & Oncology

## 2012-05-05 ENCOUNTER — Other Ambulatory Visit (HOSPITAL_BASED_OUTPATIENT_CLINIC_OR_DEPARTMENT_OTHER): Payer: BC Managed Care – PPO | Admitting: Lab

## 2012-05-05 DIAGNOSIS — C719 Malignant neoplasm of brain, unspecified: Secondary | ICD-10-CM

## 2012-05-05 LAB — CBC WITH DIFFERENTIAL (CANCER CENTER ONLY)
Eosinophils Absolute: 0 10*3/uL (ref 0.0–0.5)
HCT: 41.1 % (ref 38.7–49.9)
LYMPH%: 17.8 % (ref 14.0–48.0)
MCH: 30.3 pg (ref 28.0–33.4)
MCV: 88 fL (ref 82–98)
MONO#: 0.4 10*3/uL (ref 0.1–0.9)
NEUT%: 72.4 % (ref 40.0–80.0)
RBC: 4.68 10*6/uL (ref 4.20–5.70)
WBC: 4.4 10*3/uL (ref 4.0–10.0)

## 2012-05-05 LAB — COMPREHENSIVE METABOLIC PANEL
ALT: 16 U/L (ref 0–53)
AST: 18 U/L (ref 0–37)
Alkaline Phosphatase: 65 U/L (ref 39–117)
Sodium: 140 mEq/L (ref 135–145)
Total Bilirubin: 0.4 mg/dL (ref 0.3–1.2)
Total Protein: 6.7 g/dL (ref 6.0–8.3)

## 2012-05-06 ENCOUNTER — Ambulatory Visit (HOSPITAL_BASED_OUTPATIENT_CLINIC_OR_DEPARTMENT_OTHER): Payer: BC Managed Care – PPO | Admitting: Hematology & Oncology

## 2012-05-06 VITALS — BP 110/66 | HR 86 | Temp 98.0°F | Resp 20 | Ht 70.0 in | Wt 183.0 lb

## 2012-05-06 DIAGNOSIS — C712 Malignant neoplasm of temporal lobe: Secondary | ICD-10-CM

## 2012-05-06 DIAGNOSIS — C719 Malignant neoplasm of brain, unspecified: Secondary | ICD-10-CM

## 2012-05-06 NOTE — Progress Notes (Signed)
This office note has been dictated.

## 2012-05-07 ENCOUNTER — Telehealth: Payer: Self-pay | Admitting: Hematology & Oncology

## 2012-05-07 NOTE — Progress Notes (Signed)
CC:   [name], MD [name], M.D.  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  CURRENT THERAPY:  Patient is status post 4 cycles of adjuvant Temodar.  INTERIM HISTORY:  Mr. Nathan Prince comes in for followup.  Every time I see him, he looks better.  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  His antidepressant medications have been adjusted.  He saw [facility] a couple of weeks ago.  Everything looked [facility] at [facility].  MRI was done, which did not show any evidence of recurrence.  He is on 100 mg of Zoloft.  I think he is also on Ritalin.  He takes this, I think, 3 times a day for total of 30 mg.  Of note, he is also on Bactrim to help with Pneumocystis prophylaxis while on Temodar.  He has had no nausea or vomiting.  His appetite is good.  He has had no problems with bowels or bladder.  He has had no rashes.  He has had no double vision or blurred vision.  There have been no headaches.  PHYSICAL EXAMINATION:  This is a well-developed, well-nourished white gentleman in no obvious distress.  Vital signs: 98, pulse 86, respiratory rate 20, blood pressure 110/66.  Weight is 183.  Head and neck:  Normocephalic, atraumatic skull.  There are no ocular or oral lesions.  His craniotomy scar really cannot be detected.  He has no adenopathy in the neck.  Lungs:  Clear bilaterally.  Cardiac:  Regular rate and rhythm with a normal S1 and S2.  There are no murmurs, rubs or bruits.  Abdomen:  Soft with good bowel sounds.  There is no palpable abdominal mass.  There is no palpable hepatosplenomegaly.  Back:  No tenderness over the spine, ribs, or hips.  Extremities:  No clubbing, cyanosis or edema.  Neurological:  No focal neurological deficits.  LABORATORY STUDIES:  White cell count is 4.4, hemoglobin 14.2, hematocrit 41.1, platelet count 176.  Electrolytes show sodium 140, potassium 4.1, BUN 15, creatinine 1.1. Glucose is 94.  IMPRESSION:  Mr. Nathan Prince is a  45 year old gentleman with oligoastrocytoma of the left temporal lobe.  This was grade 3.  He did undergo complete resection.  He is on adjuvant Temodar now.  He did previously receive radiation and lower dose Temodar.  We will go ahead with his 5th cycle of Temodar today.  We will plan for a year total of therapy.  I am just glad to see that his quality of life is improving.  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  We will get him back in 1 more month.  We see him monthly.    ______________________________ Josph Macho, M.D. PRE/MEDQ  D:  05/06/2012  T:  05/07/2012  Job:  [id number]

## 2012-05-07 NOTE — Telephone Encounter (Signed)
Left pt message to call and schedule follow up

## 2012-05-13 ENCOUNTER — Other Ambulatory Visit: Payer: Self-pay | Admitting: *Deleted

## 2012-05-13 DIAGNOSIS — C719 Malignant neoplasm of brain, unspecified: Secondary | ICD-10-CM

## 2012-05-13 MED ORDER — METHYLPHENIDATE HCL 10 MG PO TABS: 10.0000 mg | ORAL_TABLET | Freq: Two times a day (BID) | ORAL | Status: DC

## 2012-05-20 ENCOUNTER — Telehealth: Payer: Self-pay | Admitting: Hematology & Oncology

## 2012-05-20 NOTE — Telephone Encounter (Signed)
Had not heard from pt scheduled 9-26 and left message with appointment

## 2012-05-22 ENCOUNTER — Telehealth: Payer: Self-pay | Admitting: Hematology & Oncology

## 2012-05-22 NOTE — Telephone Encounter (Signed)
Patient's wife called and cx 06/04/12 apt and resch the labs for 06/02/12 and the MD visit for 9/26 at 4:30 due to work sch.

## 2012-06-02 ENCOUNTER — Other Ambulatory Visit (HOSPITAL_BASED_OUTPATIENT_CLINIC_OR_DEPARTMENT_OTHER): Payer: BC Managed Care – PPO | Admitting: Lab

## 2012-06-02 DIAGNOSIS — C712 Malignant neoplasm of temporal lobe: Secondary | ICD-10-CM

## 2012-06-02 DIAGNOSIS — C719 Malignant neoplasm of brain, unspecified: Secondary | ICD-10-CM

## 2012-06-02 LAB — CBC WITH DIFFERENTIAL (CANCER CENTER ONLY)
BASO%: 1 % (ref 0.0–2.0)
Eosinophils Absolute: 0 10*3/uL (ref 0.0–0.5)
LYMPH%: 21.4 % (ref 14.0–48.0)
MCH: 30.6 pg (ref 28.0–33.4)
MCHC: 34.3 g/dL (ref 32.0–35.9)
MCV: 89 fL (ref 82–98)
MONO%: 10.7 % (ref 0.0–13.0)
Platelets: 148 10*3/uL (ref 145–400)
RDW: 13 % (ref 11.1–15.7)

## 2012-06-02 LAB — COMPREHENSIVE METABOLIC PANEL
Alkaline Phosphatase: 62 U/L (ref 39–117)
Glucose, Bld: 95 mg/dL (ref 70–99)
Sodium: 140 mEq/L (ref 135–145)
Total Bilirubin: 0.4 mg/dL (ref 0.3–1.2)
Total Protein: 6.4 g/dL (ref 6.0–8.3)

## 2012-06-04 ENCOUNTER — Other Ambulatory Visit: Payer: Self-pay | Admitting: Lab

## 2012-06-04 ENCOUNTER — Ambulatory Visit (HOSPITAL_BASED_OUTPATIENT_CLINIC_OR_DEPARTMENT_OTHER): Payer: BC Managed Care – PPO | Admitting: Hematology & Oncology

## 2012-06-04 ENCOUNTER — Ambulatory Visit (HOSPITAL_BASED_OUTPATIENT_CLINIC_OR_DEPARTMENT_OTHER): Payer: BC Managed Care – PPO

## 2012-06-04 ENCOUNTER — Ambulatory Visit: Payer: Self-pay | Admitting: Hematology & Oncology

## 2012-06-04 VITALS — BP 113/71 | HR 76 | Temp 97.7°F | Resp 20 | Ht 70.0 in | Wt 181.0 lb

## 2012-06-04 DIAGNOSIS — C719 Malignant neoplasm of brain, unspecified: Secondary | ICD-10-CM

## 2012-06-04 DIAGNOSIS — C712 Malignant neoplasm of temporal lobe: Secondary | ICD-10-CM

## 2012-06-04 DIAGNOSIS — Z23 Encounter for immunization: Secondary | ICD-10-CM

## 2012-06-04 NOTE — Progress Notes (Signed)
This office note has been dictated.

## 2012-06-05 MED ORDER — INFLUENZA VIRUS VACC SPLIT PF IM SUSP
0.5000 mL | INTRAMUSCULAR | Status: AC
  Administered 2012-06-04: 0.5 mL via INTRAMUSCULAR
  Filled 2012-06-05: qty 0.5

## 2012-06-05 NOTE — Progress Notes (Signed)
CC:   Nathan Simmonds, MD [name], M.D.  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  CURRENT THERAPY:  The patient is status post 5 cycles of adjuvant Temodar.  INTERIM HISTORY:  Nathan Prince comes in for followup.  He continues to look incredibly well.  He really sounds well.  His memory seems to be coming back.  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  He is enjoying his independence and getting back to his regular lifestyle.  I think that the Zoloft and Ritalin have been a big help to him.  He sees Dr. Noe Gens at [facility] I think October 15th.  I am pretty sure she will be doing an MRI of his brain.  He has had no nausea or vomiting.  He has had no diarrhea.  He has had no rashes.  He has had no cough or shortness of breath.  He continues on Bactrim for Pneumocystis prophylaxis.  PHYSICAL EXAMINATION:  This is a well-developed, well-nourished white gentleman in no obvious distress.  Vital signs:  97.7, pulse 76, respiratory rate 20, blood pressure 113/71.  Weight is 181.  Head and neck:  Normocephalic, atraumatic skull.  He has the craniotomy scar in the left temporal region.  This is barely visible now.  He has no oral lesions.  There is no thrush.  He has no adenopathy in his neck.  Lungs: Clear bilaterally.  There are no rales, wheezes or rhonchi.  Cardiac: Regular rate and rhythm with a normal S1 and S2.  There are no murmurs, rubs or bruits.  Abdomen:  Soft with good bowel sounds.  There is no fluid wave.  There is no palpable hepatosplenomegaly.  Extremities:  No clubbing, cyanosis or edema.  There is good range motion of his joints. He has good strength bilaterally.  Neurologic:  No focal neurological deficits.  LABORATORY STUDIES:  White cell count is 4.0, hemoglobin 14.3, hematocrit 41.7, platelet count 148.  BUN is 14, creatinine 1.14.  LDH is 119.  IMPRESSION:  Nathan Prince is a 46 year old gentleman with a  grade 3 oligoastrocytoma of the left temporal lobe.  This has been resected.  He initially received chemoradiation therapy.  He has had no evidence of recurrent disease to date.  He now is on full-dose Temodar.  He actually started his 6th cycle of Temodar yesterday.  I will plan to see him back on October 30th.  Again, will see Dr. Noe Gens at [facility] on October 15.  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    ______________________________ Josph Macho, M.D. PRE/MEDQ  D:  06/04/2012  T:  06/05/2012  Job:  [id number]

## 2012-06-13 ENCOUNTER — Other Ambulatory Visit: Payer: Self-pay | Admitting: Hematology & Oncology

## 2012-06-22 ENCOUNTER — Other Ambulatory Visit: Payer: Self-pay | Admitting: *Deleted

## 2012-06-22 DIAGNOSIS — C719 Malignant neoplasm of brain, unspecified: Secondary | ICD-10-CM

## 2012-06-22 MED ORDER — METHYLPHENIDATE HCL 10 MG PO TABS: 10.0000 mg | ORAL_TABLET | Freq: Two times a day (BID) | ORAL | Status: DC

## 2012-06-22 NOTE — Telephone Encounter (Signed)
Pt called requesting a Ritalin refill. Will route to Dr Myna Hidalgo for approval and signature then will place at the front desk for pick up.

## 2012-07-06 ENCOUNTER — Other Ambulatory Visit (HOSPITAL_BASED_OUTPATIENT_CLINIC_OR_DEPARTMENT_OTHER): Payer: BC Managed Care – PPO | Admitting: Lab

## 2012-07-06 ENCOUNTER — Other Ambulatory Visit: Payer: Self-pay | Admitting: Lab

## 2012-07-06 DIAGNOSIS — C719 Malignant neoplasm of brain, unspecified: Secondary | ICD-10-CM

## 2012-07-06 DIAGNOSIS — C712 Malignant neoplasm of temporal lobe: Secondary | ICD-10-CM

## 2012-07-06 LAB — COMPREHENSIVE METABOLIC PANEL
Albumin: 4.4 g/dL (ref 3.5–5.2)
BUN: 10 mg/dL (ref 6–23)
CO2: 27 mEq/L (ref 19–32)
Calcium: 9.6 mg/dL (ref 8.4–10.5)
Chloride: 107 mEq/L (ref 96–112)
Glucose, Bld: 75 mg/dL (ref 70–99)
Potassium: 4.3 mEq/L (ref 3.5–5.3)
Sodium: 142 mEq/L (ref 135–145)
Total Protein: 6.2 g/dL (ref 6.0–8.3)

## 2012-07-06 LAB — CBC WITH DIFFERENTIAL (CANCER CENTER ONLY)
BASO#: 0 10*3/uL (ref 0.0–0.2)
Eosinophils Absolute: 0 10*3/uL (ref 0.0–0.5)
HGB: 14.5 g/dL (ref 13.0–17.1)
LYMPH#: 0.8 10*3/uL — ABNORMAL LOW (ref 0.9–3.3)
MCH: 30.7 pg (ref 28.0–33.4)
MONO#: 0.4 10*3/uL (ref 0.1–0.9)
MONO%: 9.3 % (ref 0.0–13.0)
NEUT#: 3.1 10*3/uL (ref 1.5–6.5)
Platelets: 167 10*3/uL (ref 145–400)
RBC: 4.72 10*6/uL (ref 4.20–5.70)
WBC: 4.3 10*3/uL (ref 4.0–10.0)

## 2012-07-06 LAB — LACTATE DEHYDROGENASE: LDH: 127 U/L (ref 94–250)

## 2012-07-07 ENCOUNTER — Other Ambulatory Visit: Payer: Self-pay | Admitting: Lab

## 2012-07-08 ENCOUNTER — Ambulatory Visit (HOSPITAL_BASED_OUTPATIENT_CLINIC_OR_DEPARTMENT_OTHER): Payer: BC Managed Care – PPO | Admitting: Hematology & Oncology

## 2012-07-08 VITALS — BP 117/80 | HR 66 | Temp 97.5°F | Resp 18 | Ht 70.0 in | Wt 187.0 lb

## 2012-07-08 DIAGNOSIS — C712 Malignant neoplasm of temporal lobe: Secondary | ICD-10-CM

## 2012-07-08 DIAGNOSIS — C719 Malignant neoplasm of brain, unspecified: Secondary | ICD-10-CM

## 2012-07-08 NOTE — Progress Notes (Signed)
This office note has been dictated.

## 2012-07-09 ENCOUNTER — Telehealth: Payer: Self-pay | Admitting: Hematology & Oncology

## 2012-07-09 NOTE — Telephone Encounter (Signed)
Left message with 11-21 appointment

## 2012-07-09 NOTE — Progress Notes (Signed)
CC:   [name], MD [name], M.D.  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  CURRENT THERAPY:  The patient is status post 6 cycles of adjuvant Temodar.  INTERIM HISTORY:  Mr. Nathan Prince comes in for his followup.  He is doing well.  He really feels good.  He is still teaching at [employer].  He was just seen out at [facility].  They are very pleased with his progress. He had a repeat MRI done.  The MRI showed no evidence of disease progression.  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  He was MGMT negative.  Again, he has responded very well to treatment.  His quality of life is fantastic.  He sees the oncologist at [facility] in a couple months.  They do his scans out there.  PHYSICAL EXAMINATION:  General:  This is a well-developed, well- nourished white gentleman in no obvious distress.  Vital Signs:  Show a temperature of 97.5, pulse 66, respiratory rate 18, blood pressure 117/80.  Weight is 187.  Head and Neck Exam:  Shows a normocephalic, atraumatic skull.  There are no ocular or oral lesions.  There are no palpable cervical or supraclavicular lymph nodes.  His left craniotomy scar is well healed.  Lungs:  Clear bilaterally.  Cardiac Exam:  Regular rate and rhythm with normal S1 and S2.  There are no murmurs, rubs or bruits.  Abdominal Exam:  Soft with good bowel sounds.  There is no palpable abdominal mass.  There is no fluid wave.  There is no palpable hepatosplenomegaly.  Back Exam:  No tenderness over the spine, ribs or hips.  Extremities:  Show no clubbing, cyanosis, or edema.  Neurological Exam:  Shows no focal neurological deficits.  His word finding is improving.  LABORATORY STUDIES:  Show a white cell count of 4.3, hemoglobin 14.5, hematocrit 42.2, platelet count 167.  Sodium 142, potassium 4.3, BUN 10, creatinine 1.1.  LFTs are normal. LDH is  127.  IMPRESSION:  Mr. [name] is a 46 year old gentleman with a grade 3 oligoastrocytoma of the left temporal lobe.  This was resected.  He underwent chemoradiation therapy, followed by full-dose chemotherapy.  We will go ahead and start his 7th cycle of chemotherapy on, I think, November 20th.  I am just happy that he has done very well.  He continues on Zoloft and Ritalin, which have been very productive for him.  He continues on Bactrim.  His lymphocytes are very low.  As such, I feel as though the Bactrim will continue to help with pneumocystis prophylaxis.    ______________________________ Josph Macho, M.D. PRE/MEDQ  D:  07/08/2012  T:  07/09/2012  Job:  [id number]

## 2012-07-18 ENCOUNTER — Other Ambulatory Visit: Payer: Self-pay | Admitting: Hematology & Oncology

## 2012-07-29 ENCOUNTER — Other Ambulatory Visit (HOSPITAL_BASED_OUTPATIENT_CLINIC_OR_DEPARTMENT_OTHER): Payer: BC Managed Care – PPO | Admitting: Lab

## 2012-07-29 DIAGNOSIS — C719 Malignant neoplasm of brain, unspecified: Secondary | ICD-10-CM

## 2012-07-29 LAB — CBC WITH DIFFERENTIAL (CANCER CENTER ONLY)
BASO#: 0 10*3/uL (ref 0.0–0.2)
EOS%: 1.3 % (ref 0.0–7.0)
HCT: 42 % (ref 38.7–49.9)
HGB: 14.2 g/dL (ref 13.0–17.1)
LYMPH%: 19.8 % (ref 14.0–48.0)
MCH: 30.5 pg (ref 28.0–33.4)
MCHC: 33.8 g/dL (ref 32.0–35.9)
MCV: 90 fL (ref 82–98)
MONO%: 8.6 % (ref 0.0–13.0)
NEUT#: 2.8 10*3/uL (ref 1.5–6.5)
NEUT%: 69.8 % (ref 40.0–80.0)

## 2012-07-29 LAB — COMPREHENSIVE METABOLIC PANEL
AST: 21 U/L (ref 0–37)
Alkaline Phosphatase: 72 U/L (ref 39–117)
BUN: 13 mg/dL (ref 6–23)
Creatinine, Ser: 1.07 mg/dL (ref 0.50–1.35)
Total Bilirubin: 0.4 mg/dL (ref 0.3–1.2)

## 2012-07-30 ENCOUNTER — Ambulatory Visit (HOSPITAL_BASED_OUTPATIENT_CLINIC_OR_DEPARTMENT_OTHER): Payer: BC Managed Care – PPO | Admitting: Hematology & Oncology

## 2012-07-30 ENCOUNTER — Other Ambulatory Visit: Payer: Self-pay | Admitting: Lab

## 2012-07-30 VITALS — BP 127/78 | HR 76 | Temp 98.6°F | Resp 18 | Ht 70.0 in | Wt 186.0 lb

## 2012-07-30 DIAGNOSIS — C712 Malignant neoplasm of temporal lobe: Secondary | ICD-10-CM

## 2012-07-30 DIAGNOSIS — C719 Malignant neoplasm of brain, unspecified: Secondary | ICD-10-CM

## 2012-07-30 NOTE — Progress Notes (Signed)
This office note has been dictated.

## 2012-07-31 ENCOUNTER — Telehealth: Payer: Self-pay | Admitting: Hematology & Oncology

## 2012-07-31 NOTE — Progress Notes (Signed)
CC:   [name], MD [name], M.D.  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  CURRENT THERAPY:  The patient is status post 7 cycles of adjuvant Temodar.  INTERVAL HISTORY:  Mr. Vanduyne comes in for his followup.  He is looking great.  He has really had no complaints since we saw him 3 weeks ago. He is still teaching full-time.  His cognition and mentation seem to be improving.  He has had no problems with fevers, sweats, or chills.  He continues on Bactrim for Pneumocystis prophylaxis.  There has been no change in bowel or bladder habits.  He has had no nausea.  He has had no mouth sores.  There has been no leg swelling.  He has had no rashes.  He has had no problems seizure-wise.  He is on, I think, Ritalin and Zoloft.  These really have made a difference with how he interacts and how he is able to think.  PHYSICAL EXAMINATION:  General:  This is a well-developed, well- nourished white gentleman, in no obvious distress.  Vital Signs: Temperature 98.6, pulse 76, respiratory rate 18, blood pressure 127/78. Weight is 186.  Head and neck:  Normocephalic, atraumatic skull.  There are no ocular or oral lesions.  There are no palpable cervical or supraclavicular lymph nodes.  Lungs:  Clear bilaterally.  Cardiac: Regular rate and rhythm, with a normal S1 and S2.  There are no murmurs, rubs, or bruits.  Abdomen:  Soft, with good bowel sounds.  There is no fluid wave.  There is no palpable hepatosplenomegaly.  Extremities: Show no clubbing, cyanosis, or edema.  Neurological:  Shows no focal neurological deficits.  Skin:  No rashes, ecchymoses, or petechiae.  LABORATORY STUDIES:  White cell count is 3.9, hemoglobin 14.2, hematocrit 42, platelet count 157,000.  IMPRESSION:  Mr. Hutchens is a 46 year old gentleman with a grade 3 oligoastrocytoma.  He did have this resected.  He then underwent chemo and radiation therapy.  Of note, his tumor is  positive for a 19q deletion.  He also does have a mutation with a IDH-1 gene.  He is negative for MGMT methylation.  It is nice that he is responding so nicely.  Clinically, his performance status is outstanding, at ECOG 0.  We will go ahead and plan to get him back in 4 weeks now.  I think the timing would be appropriate for him to enjoy Christmas.    ______________________________ Josph Macho, M.D. PRE/MEDQ  D:  07/30/2012  T:  07/31/2012  Job:  [id number]

## 2012-07-31 NOTE — Telephone Encounter (Signed)
Left pt message to call to schedule appointment in 1 month

## 2012-08-05 ENCOUNTER — Other Ambulatory Visit: Payer: Self-pay | Admitting: *Deleted

## 2012-08-05 DIAGNOSIS — C719 Malignant neoplasm of brain, unspecified: Secondary | ICD-10-CM

## 2012-08-05 MED ORDER — METHYLPHENIDATE HCL 10 MG PO TABS: 10.0000 mg | ORAL_TABLET | Freq: Two times a day (BID) | ORAL | Status: DC

## 2012-08-05 NOTE — Telephone Encounter (Signed)
Pt called requesting a Ritalin refill. It was last filled on 06/22/12. She stated it he does not take it on the weekends and that Dr Myna Hidalgo is aware of this. Will route to Dr Myna Hidalgo for approval and signature then will place at the front desk for pick up.

## 2012-08-12 DIAGNOSIS — F32A Depression, unspecified: Secondary | ICD-10-CM | POA: Insufficient documentation

## 2012-08-12 DIAGNOSIS — IMO0002 Reserved for concepts with insufficient information to code with codable children: Secondary | ICD-10-CM

## 2012-08-12 DIAGNOSIS — F329 Major depressive disorder, single episode, unspecified: Secondary | ICD-10-CM | POA: Insufficient documentation

## 2012-08-12 DIAGNOSIS — C4490 Unspecified malignant neoplasm of skin, unspecified: Secondary | ICD-10-CM | POA: Insufficient documentation

## 2012-08-13 ENCOUNTER — Encounter: Payer: Self-pay | Admitting: Radiation Oncology

## 2012-08-13 ENCOUNTER — Ambulatory Visit
Admission: RE | Admit: 2012-08-13 | Discharge: 2012-08-13 | Disposition: A | Discharge status: Home / Self Care | Payer: BC Managed Care – PPO | Source: Ambulatory Visit | Attending: Radiation Oncology | Admitting: Radiation Oncology

## 2012-08-13 VITALS — BP 115/75 | HR 81 | Temp 98.3°F | Resp 18 | Wt 188.7 lb

## 2012-08-13 DIAGNOSIS — G9389 Other specified disorders of brain: Secondary | ICD-10-CM

## 2012-08-13 NOTE — Progress Notes (Signed)
Patient presents to the clinic today accompanied by his wife for a follow up appointment with Dr. Kathrynn Running. Patient alert and oriented to person, place, and time. No distress noted. Steady gait noted. Pleasant affect noted. Patient denies pain at this time. Patient has returned to work full time. Patient reports he is very activity with a normal energy level. Patient denies headaches. Patient reports taking temodar, keppra, and bactrim as directed. Patient reports nausea only associated with chemo but, denies emesis. Weight stable. Patient reports an excellent appetite. Patient reports sleeping well. Last MRI was in October and next one is scheduled at [facility] for December 10th. Patient and wife both happy with the services here and are smiling. Patient reports vision is normal. Reported all findings to Dr. Kathrynn Running.

## 2012-08-14 ENCOUNTER — Encounter: Payer: Self-pay | Admitting: Radiation Oncology

## 2012-08-14 NOTE — Progress Notes (Signed)
Radiation Oncology         [phone]) [phone] ________________________________  Name: Nathan Prince MRN: [id number]  Date: 08/13/2012  DOB: June 01, 1966  Follow-Up Visit Note  CC: Lonia Blood, MD  Karn Cassis, MD  Diagnosis:   46 year old gentleman with grade 3 oligoastrocytoma of the left temporal brain s/p resection and adjuvant radiation treatment 11/04/2011-12/19/2011 to 59.4 Gy with TomoTherapy  Interval Since Last Radiation:  3 months  Narrative:  The patient returns today for routine follow-up.  Patient presents to the clinic today accompanied by his wife for a follow up appointment with Dr. Kathrynn Running. Patient alert and oriented to person, place, and time. No distress noted. Steady gait noted. Pleasant affect noted. Patient denies pain at this time. Patient has returned to work full time. Patient reports he is very activity with a normal energy level. Patient denies headaches. Patient reports taking temodar, keppra, and bactrim as directed. Patient reports nausea only associated with chemo but, denies emesis. Weight stable. Patient reports an excellent appetite. Patient reports sleeping well. Last MRI was in October and next one is scheduled at [facility] for December 10th. Patient and wife both happy with the services here and are smiling. Patient reports vision is normal.                             ALLERGIES:   has no known allergies.  Meds: Current Outpatient Prescriptions  Medication Sig Dispense Refill  . acetaminophen (TYLENOL) 650 MG CR tablet Take 650 mg by mouth every 8 (eight) hours as needed.      . Calcium Carbonate-Vitamin D (CALTRATE 600+D) 600-400 MG-UNIT per chew tablet Chew 1 tablet by mouth 2 (two) times daily.      Marland Kitchen docusate sodium (COLACE) 100 MG capsule Take 100 mg by mouth 3 (three) times daily as needed.       . levETIRAcetam (KEPPRA) 500 MG tablet TAKE 1 TABLET (500 MG TOTAL) BY MOUTH 2 (TWO) TIMES DAILY.  60 tablet  3  . methylphenidate (RITALIN) 10 MG tablet Take  1 tablet (10 mg total) by mouth 2 (two) times daily. 2 tabs in AM and 1 at noon  90 tablet  0  . Multiple Vitamins-Minerals (MULTIVITAMINS THER. W/MINERALS) TABS Take 1 tablet by mouth daily.       . OMEGA-3 KRILL OIL PO Take 1 capsule by mouth daily.        . ondansetron (ZOFRAN) 8 MG tablet TAKE 1 TABLET (8 MG TOTAL) BY MOUTH EVERY 8 (EIGHT) HOURS AS NEEDED FOR NAUSEA.  30 tablet  3  . sertraline (ZOLOFT) 50 MG tablet Take 100 mg by mouth daily.       Marland Kitchen sulfamethoxazole-trimethoprim (BACTRIM DS) 800-160 MG per tablet TAKE 1 TABLET BY MOUTH EVERY DAY  30 tablet  6  . temozolomide (TEMODAR) 100 MG capsule May take on an empty stomach or at bedtime to decrease nausea & vomiting.Takes 4 100 mg.for 5 days then off for 23 days       No current facility-administered medications for this encounter.   Facility-Administered Medications Ordered in Other Encounters  Medication Dose Route Frequency Provider Last Rate Last Dose  . topical emolient (BIAFINE) emulsion   Topical PRN Oneita Hurt, MD        Physical Findings: The patient is in no acute distress. Patient is alert and oriented.  weight is 188 lb 11.2 oz (85.594 kg). His oral temperature is 98.3 F (  36.8 C). His blood pressure is 115/75 and his pulse is 81. His respiration is 18. .  No significant changes.   Impression:  The patient is recovering from the effects of radiation.  His vision has improved.  Plan:  F/U in 6 months.  _____________________________________  Artist Pais. Kathrynn Running, M.D.

## 2012-08-25 ENCOUNTER — Other Ambulatory Visit (HOSPITAL_BASED_OUTPATIENT_CLINIC_OR_DEPARTMENT_OTHER): Payer: BC Managed Care – PPO | Admitting: Lab

## 2012-08-25 DIAGNOSIS — C719 Malignant neoplasm of brain, unspecified: Secondary | ICD-10-CM

## 2012-08-25 LAB — CBC WITH DIFFERENTIAL (CANCER CENTER ONLY)
BASO#: 0 10*3/uL (ref 0.0–0.2)
Eosinophils Absolute: 0.1 10*3/uL (ref 0.0–0.5)
HGB: 14.3 g/dL (ref 13.0–17.1)
LYMPH%: 9.9 % — ABNORMAL LOW (ref 14.0–48.0)
MCH: 30.6 pg (ref 28.0–33.4)
MCV: 89 fL (ref 82–98)
MONO#: 0.5 10*3/uL (ref 0.1–0.9)
MONO%: 10.3 % (ref 0.0–13.0)
RBC: 4.68 10*6/uL (ref 4.20–5.70)
WBC: 5.1 10*3/uL (ref 4.0–10.0)

## 2012-08-25 LAB — COMPREHENSIVE METABOLIC PANEL
Albumin: 4.8 g/dL (ref 3.5–5.2)
Alkaline Phosphatase: 81 U/L (ref 39–117)
BUN: 15 mg/dL (ref 6–23)
CO2: 25 mEq/L (ref 19–32)
Glucose, Bld: 85 mg/dL (ref 70–99)
Potassium: 4.1 mEq/L (ref 3.5–5.3)
Total Protein: 6.2 g/dL (ref 6.0–8.3)

## 2012-08-26 ENCOUNTER — Ambulatory Visit (HOSPITAL_BASED_OUTPATIENT_CLINIC_OR_DEPARTMENT_OTHER): Payer: BC Managed Care – PPO | Admitting: Hematology & Oncology

## 2012-08-26 VITALS — BP 114/77 | HR 89 | Temp 96.6°F | Resp 20 | Wt 186.0 lb

## 2012-08-26 DIAGNOSIS — C712 Malignant neoplasm of temporal lobe: Secondary | ICD-10-CM

## 2012-08-26 DIAGNOSIS — C719 Malignant neoplasm of brain, unspecified: Secondary | ICD-10-CM

## 2012-08-26 NOTE — Progress Notes (Signed)
This office note has been dictated.

## 2012-08-27 ENCOUNTER — Telehealth: Payer: Self-pay | Admitting: Hematology & Oncology

## 2012-08-27 NOTE — Progress Notes (Signed)
CC:   Nathan Simmonds, MD [name], M.D.  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  CURRENT THERAPY:  Patient is status post 8 cycles of Temodar.  INTERIM HISTORY:  Nathan Prince comes in for his followup.  He is doing quite well.  He just was seen at [facility].  He had another MRI.  The MRI did not show any evidence of recurrent disease.  He is really looking good.  His speech is doing quite well.  His word finding keeps improving.  He is on Ritalin and Zoloft.  I believe these are helping.  He does have a little bit of a "cold" right now.  He does sound somewhat congested.  He has had no fever.  He is not having any purulent sputum. There is no nausea or vomiting.  He does continue on Bactrim as a Pneumocystis prophylactic agent.  He has had no problems with bowels or bladder.  He has had no leg swelling.  He has had no rashes.  PHYSICAL EXAMINATION:  General:  This is a well-developed, well- nourished white gentleman in no obvious distress.  Vital Signs:  96.6, pulse 89, respiratory rate 20, blood pressure 114/77.  Weight is 186. Head and Neck:  Normocephalic, atraumatic skull.  There are no ocular or oral lesions.  There are no palpable cervical or supraclavicular lymph nodes.  Lungs:  Clear bilaterally.  Cardiac:  Regular rate and rhythm with a normal S1 and S2.  There are no murmurs, rubs, or bruits. Abdomen:  Soft with good bowel sounds.  There is no palpable abdominal mass.  There is no palpable hepatosplenomegaly.  Back:  No tenderness over the spine, ribs, or hips.  Extremities:  No clubbing, cyanosis, or edema.  Neurological:  No focal neurological deficits.  LABORATORY STUDIES:  White cell count 5.1, hemoglobin 14.3, hematocrit 41.5, platelet count 155.  LDH is 117.  IMPRESSION:  Nathan Prince is a 46 year old gentleman with a grade 3 oligoastrocytoma of the left temporal lobe.  He  is doing well quite well.  Again, there is no evidence of recurrent disease by his recent MRI.  We will go ahead and plan for his Temodar as we have been doing.  He will have another MRI/PET scan at [facility] in February.  From what I have been told, if this is negative, then he will stop the Temodar at that point.  We will see Nathan Prince back next month.    ______________________________ Josph Macho, M.D. PRE/MEDQ  D:  08/26/2012  T:  08/27/2012  Job:  [id number]

## 2012-08-27 NOTE — Telephone Encounter (Signed)
Left pt message on both phones with 1-14 and 15 appointment date and time

## 2012-09-21 ENCOUNTER — Other Ambulatory Visit: Payer: Self-pay | Admitting: *Deleted

## 2012-09-21 DIAGNOSIS — C719 Malignant neoplasm of brain, unspecified: Secondary | ICD-10-CM

## 2012-09-21 MED ORDER — METHYLPHENIDATE HCL 10 MG PO TABS: 10.0000 mg | ORAL_TABLET | Freq: Two times a day (BID) | ORAL | Status: DC

## 2012-09-21 NOTE — Telephone Encounter (Signed)
Pt called requesting a Ritalin refill. It was last filled on 08/05/12. She stated it he does not take it on the weekends and that Dr Myna Hidalgo is aware of this. Will route to Dr Myna Hidalgo for approval and signature then will place at the front desk for pick up.

## 2012-09-22 ENCOUNTER — Other Ambulatory Visit (HOSPITAL_BASED_OUTPATIENT_CLINIC_OR_DEPARTMENT_OTHER): Payer: BC Managed Care – PPO | Admitting: Lab

## 2012-09-22 DIAGNOSIS — C719 Malignant neoplasm of brain, unspecified: Secondary | ICD-10-CM

## 2012-09-22 DIAGNOSIS — C712 Malignant neoplasm of temporal lobe: Secondary | ICD-10-CM

## 2012-09-22 LAB — CBC WITH DIFFERENTIAL (CANCER CENTER ONLY)
BASO%: 0.5 % (ref 0.0–2.0)
EOS%: 1.3 % (ref 0.0–7.0)
LYMPH%: 22.1 % (ref 14.0–48.0)
MCH: 30.3 pg (ref 28.0–33.4)
MCV: 87 fL (ref 82–98)
MONO%: 9.4 % (ref 0.0–13.0)
Platelets: 150 10*3/uL (ref 145–400)
RDW: 12.6 % (ref 11.1–15.7)

## 2012-09-22 LAB — COMPREHENSIVE METABOLIC PANEL
ALT: 22 U/L (ref 0–53)
Alkaline Phosphatase: 78 U/L (ref 39–117)
Sodium: 139 mEq/L (ref 135–145)
Total Bilirubin: 0.4 mg/dL (ref 0.3–1.2)
Total Protein: 6.5 g/dL (ref 6.0–8.3)

## 2012-09-23 ENCOUNTER — Ambulatory Visit (HOSPITAL_BASED_OUTPATIENT_CLINIC_OR_DEPARTMENT_OTHER): Payer: BC Managed Care – PPO | Admitting: Hematology & Oncology

## 2012-09-23 VITALS — BP 107/68 | HR 82 | Temp 97.0°F | Resp 18 | Ht 70.0 in | Wt 188.0 lb

## 2012-09-23 DIAGNOSIS — C719 Malignant neoplasm of brain, unspecified: Secondary | ICD-10-CM

## 2012-09-23 DIAGNOSIS — C712 Malignant neoplasm of temporal lobe: Secondary | ICD-10-CM

## 2012-09-23 NOTE — Progress Notes (Signed)
This office note has been dictated.

## 2012-09-24 ENCOUNTER — Telehealth: Payer: Self-pay | Admitting: Hematology & Oncology

## 2012-09-24 NOTE — Telephone Encounter (Signed)
Left pt message with 2-11 and 2-12 appointments

## 2012-09-24 NOTE — Progress Notes (Signed)
CC:   Nathan Simmonds, MD [name], M.D. 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  CURRENT THERAPY:  Patient is status post 9 cycles of Temodar.  INTERIM HISTORY:  Nathan Prince comes in for his followup.  He is still doing quite well.  He has had no problems since we last saw him a month ago.  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  He is quite eloquent.  His memory and speech are doing well.  He has really not had too much in the way of fatigue.  He is on Ritalin and Zoloft.  These have made a big impact on him.  He did have a little bit of a "cold" last time we saw him.  He got over this nicely.  He is on Bactrim for Pneumocystis prophylaxis.  I want him to stay on this.  He has had no headache.  There has been no double vision or blurred vision.  He has had no swallowing difficulties.  He has had no change in bowel or bladder habits.  There have been no rashes.  PHYSICAL EXAMINATION:  General:  This is a well-developed, well- nourished white gentleman in no obvious distress.  Vital signs: Temperature of 97.9, pulse 82, respiratory rate 18, blood pressure 107/68.  Weight is 188.  Head and neck:  Normocephalic, atraumatic skull.  There are no ocular or oral lesions.  There are no palpable cervical or supraclavicular lymph nodes.  He does have the well-healed left temporal craniotomy scar.  Lungs:  Clear bilaterally.  Cardiac: Regular rate and rhythm with a normal S1 and S2.  There are no murmurs, rubs, or bruits.  Abdomen:  Soft with good bowel sounds.  There is no palpable abdominal mass.  There is no fluid wave.  There is no palpable hepatosplenomegaly.  Back:  No tenderness over the spine, ribs, or hips. Extremities:  No clubbing, cyanosis, or edema.  Neurological:  No focal neurological deficits.  LABORATORY STUDIES:  White cell count is 3.7, hemoglobin 14.5, hematocrit 41.8, platelet count 150.  LDH is a  129.  IMPRESSION:  Nathan Prince is a 47 year old gentleman with an oligoastrocytoma of the left temporal lobe.  Again, this was resected. This is MGMT methyl negative.  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  He also has a IDH-1 mutation.  He goes back to [facility] for another MRI and brain PET scan in February.  If these look good, then he will not need any further Temodar.  We will plan to see him back right around that appointment.    ______________________________ Josph Macho, M.D. PRE/MEDQ  D:  09/23/2012  T:  09/24/2012  Job:  [id number]

## 2012-09-28 ENCOUNTER — Other Ambulatory Visit: Payer: Self-pay | Admitting: Radiation Oncology

## 2012-10-19 ENCOUNTER — Telehealth: Payer: Self-pay | Admitting: Hematology & Oncology

## 2012-10-19 NOTE — Telephone Encounter (Signed)
Pt moved 2-12 to 2-17

## 2012-10-20 ENCOUNTER — Other Ambulatory Visit (HOSPITAL_BASED_OUTPATIENT_CLINIC_OR_DEPARTMENT_OTHER): Payer: BC Managed Care – PPO | Admitting: Lab

## 2012-10-20 DIAGNOSIS — C719 Malignant neoplasm of brain, unspecified: Secondary | ICD-10-CM

## 2012-10-20 DIAGNOSIS — C712 Malignant neoplasm of temporal lobe: Secondary | ICD-10-CM

## 2012-10-20 LAB — COMPREHENSIVE METABOLIC PANEL
AST: 20 U/L (ref 0–37)
Albumin: 4.7 g/dL (ref 3.5–5.2)
BUN: 18 mg/dL (ref 6–23)
Calcium: 9.2 mg/dL (ref 8.4–10.5)
Chloride: 107 mEq/L (ref 96–112)
Glucose, Bld: 110 mg/dL — ABNORMAL HIGH (ref 70–99)
Potassium: 4.2 mEq/L (ref 3.5–5.3)
Sodium: 140 mEq/L (ref 135–145)
Total Protein: 6.3 g/dL (ref 6.0–8.3)

## 2012-10-20 LAB — CBC WITH DIFFERENTIAL (CANCER CENTER ONLY)
BASO#: 0 10*3/uL (ref 0.0–0.2)
EOS%: 1.3 % (ref 0.0–7.0)
Eosinophils Absolute: 0.1 10*3/uL (ref 0.0–0.5)
HGB: 14.6 g/dL (ref 13.0–17.1)
LYMPH#: 0.9 10*3/uL (ref 0.9–3.3)
NEUT#: 2.6 10*3/uL (ref 1.5–6.5)
Platelets: 163 10*3/uL (ref 145–400)
RBC: 4.84 10*6/uL (ref 4.20–5.70)

## 2012-10-21 ENCOUNTER — Ambulatory Visit: Payer: Self-pay | Admitting: Hematology & Oncology

## 2012-10-26 ENCOUNTER — Ambulatory Visit (HOSPITAL_BASED_OUTPATIENT_CLINIC_OR_DEPARTMENT_OTHER): Payer: BC Managed Care – PPO | Admitting: Hematology & Oncology

## 2012-10-26 VITALS — BP 109/72 | HR 85 | Temp 97.9°F | Resp 18 | Ht 70.0 in | Wt 190.0 lb

## 2012-10-26 DIAGNOSIS — C713 Malignant neoplasm of parietal lobe: Secondary | ICD-10-CM

## 2012-10-26 DIAGNOSIS — C712 Malignant neoplasm of temporal lobe: Secondary | ICD-10-CM

## 2012-10-26 NOTE — Progress Notes (Signed)
This office note has been dictated.

## 2012-10-27 NOTE — Progress Notes (Signed)
CC:   Joycelyn Rua, M.D. [name], MD [name], M.D.  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  CURRENT THERAPY:  Patient is status post 11 cycles of Temodar.  INTERIM HISTORY:  Mr. Zurawski comes in for his followup.  He actually completed his 11th cycle last week.  He and his wife had no problems with the snow.  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  They go to [facility] tomorrow for the MRI and cranial PET scan.  Hopefully, these will be negative and that he will not need a 12th and final cycle of Temodar.  He has had no complications from the Temodar.  He has had no nausea or vomiting.  He has had no cough.  He has had no change in bowel or bladder habits.  He has had no double vision or blurred vision.  He continues on Bactrim for Pneumocystis prophylaxis.  I told to stay on this for 2 months after he completes his last dose of the Temodar.  PHYSICAL EXAMINATION:  General:  This is a well-developed, well- nourished white gentleman in no obvious distress.  Vital signs: Temperature of 97.9, pulse 85, respiratory rate 18, blood pressure 109/72.  Weight is 190.  Head and neck:  Normocephalic, atraumatic skull.  There are no ocular or oral lesions.  There are no palpable cervical or supraclavicular lymph nodes.  Lungs:  Clear bilaterally. Cardiac:  Regular rate and rhythm with a normal S1 and S2.  There are no murmurs, rubs, or bruits.  Abdomen:  Soft with good bowel sounds.  There is no palpable abdominal mass.  There is no fluid wave.  There is no palpable hepatosplenomegaly.  Extremities:  No clubbing, cyanosis, or edema.  Neurological:  No focal neurological deficits.  Skin:  No rashes, ecchymoses, or petechiae.  LABORATORY STUDIES:  Show a white cell count of 3.9, hemoglobin 14.6, hematocrit 42.8, platelet count 163.  Sodium 140, potassium 4.3, BUN 18, creatinine 1.16.  Calcium 9.2  with an albumin of 4.7.  IMPRESSION:  Mr. Klingerman is a 47 year old gentleman with an oligoastrocytoma of the left temporal lobe.  This was resected.  He has done incredibly well.  Of note, he is mGC methylation negative.  He also has a 19q deletion as well as an IDH1 mutation.  We will go ahead and see what the results are from his re-staging studies are at [facility].  Hopefully, he has no evidence of residual/recurrent disease.  I do not think that he will.  If everything is negative and he does not need another cycle of chemo, we will probably get him back in 6 weeks' time for followup.  I will wait to make an appointment for Mr. College until once I know the results of his scans at [facility].    ______________________________ Josph Macho, M.D. PRE/MEDQ  D:  10/26/2012  T:  10/27/2012  Job:  [id number]

## 2012-10-29 NOTE — Addendum Note (Signed)
Addended by: Arlan Organ R on: 10/29/2012 06:09 PM   Modules accepted: Orders

## 2012-10-30 ENCOUNTER — Telehealth: Payer: Self-pay | Admitting: Hematology & Oncology

## 2012-10-30 NOTE — Telephone Encounter (Signed)
Left message on home and cell with 4-1 and 4-2 appointments

## 2012-11-15 ENCOUNTER — Other Ambulatory Visit: Payer: Self-pay | Admitting: Hematology & Oncology

## 2012-11-16 ENCOUNTER — Other Ambulatory Visit: Payer: Self-pay | Admitting: *Deleted

## 2012-11-16 DIAGNOSIS — C719 Malignant neoplasm of brain, unspecified: Secondary | ICD-10-CM

## 2012-11-16 MED ORDER — METHYLPHENIDATE HCL 10 MG PO TABS: 10.0000 mg | ORAL_TABLET | Freq: Two times a day (BID) | ORAL | Status: DC

## 2012-11-16 NOTE — Telephone Encounter (Signed)
Pt called requesting a Ritalin refill. It was last filled on 09/21/12. He does not take it on the weekends and that Dr Myna Hidalgo is aware of this. Will route to Eunice Blase, [place] for approval and signature then will place at the front desk for pick up.

## 2012-12-02 ENCOUNTER — Ambulatory Visit: Payer: Self-pay | Admitting: Hematology & Oncology

## 2012-12-08 ENCOUNTER — Other Ambulatory Visit (HOSPITAL_BASED_OUTPATIENT_CLINIC_OR_DEPARTMENT_OTHER): Payer: BC Managed Care – PPO | Admitting: Lab

## 2012-12-08 DIAGNOSIS — C712 Malignant neoplasm of temporal lobe: Secondary | ICD-10-CM

## 2012-12-08 DIAGNOSIS — C713 Malignant neoplasm of parietal lobe: Secondary | ICD-10-CM

## 2012-12-08 LAB — COMPREHENSIVE METABOLIC PANEL
ALT: 23 U/L (ref 0–53)
AST: 22 U/L (ref 0–37)
Alkaline Phosphatase: 73 U/L (ref 39–117)
Sodium: 140 mEq/L (ref 135–145)
Total Bilirubin: 0.4 mg/dL (ref 0.3–1.2)
Total Protein: 6.1 g/dL (ref 6.0–8.3)

## 2012-12-08 LAB — CBC WITH DIFFERENTIAL (CANCER CENTER ONLY)
BASO%: 0.4 % (ref 0.0–2.0)
EOS%: 1.3 % (ref 0.0–7.0)
Eosinophils Absolute: 0.1 10*3/uL (ref 0.0–0.5)
LYMPH%: 19.2 % (ref 14.0–48.0)
MCH: 29.8 pg (ref 28.0–33.4)
MCV: 88 fL (ref 82–98)
MONO%: 6.1 % (ref 0.0–13.0)
NEUT#: 3.3 10*3/uL (ref 1.5–6.5)
Platelets: 171 10*3/uL (ref 145–400)
RBC: 4.66 10*6/uL (ref 4.20–5.70)
RDW: 12.3 % (ref 11.1–15.7)

## 2012-12-08 LAB — LACTATE DEHYDROGENASE: LDH: 136 U/L (ref 94–250)

## 2012-12-09 ENCOUNTER — Ambulatory Visit (HOSPITAL_BASED_OUTPATIENT_CLINIC_OR_DEPARTMENT_OTHER): Payer: BC Managed Care – PPO | Admitting: Hematology & Oncology

## 2012-12-09 VITALS — BP 111/80 | HR 83 | Temp 97.9°F | Resp 18 | Ht 70.0 in | Wt 193.0 lb

## 2012-12-09 DIAGNOSIS — C719 Malignant neoplasm of brain, unspecified: Secondary | ICD-10-CM

## 2012-12-09 DIAGNOSIS — C712 Malignant neoplasm of temporal lobe: Secondary | ICD-10-CM

## 2012-12-09 MED ORDER — METHYLPHENIDATE HCL 10 MG PO TABS: 10.0000 mg | ORAL_TABLET | Freq: Two times a day (BID) | ORAL | Status: DC

## 2012-12-09 NOTE — Progress Notes (Signed)
This office note has been dictated.

## 2012-12-10 NOTE — Progress Notes (Signed)
CC:   Joycelyn Rua, M.D. [name], MD [name], M.D.  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  CURRENT THERAPY:  The patient has completed full-dose Temodar.  He completed this back in January.  He goes to [facility] in a couple of weeks and will have another MRI done.  I told him to go ahead and stop the Bactrim that he was on for Pneumocystis prophylaxis.  His blood counts are recovering nicely.  He is on Ritalin.  This has helped with his overall activity level.  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  He has had no headache.  His speech is doing quite well.  He has not noted any problems with seizures.  He has had no weakness in his arms or legs.  PHYSICAL EXAMINATION:  General:  This is a well-developed, well- nourished white gentleman in no obvious distress.  Vital signs: Temperature 97.9, pulse 83, respiratory rate 18, blood pressure 111/80. Weight is 193.  Head and neck:  Normocephalic, atraumatic skull.  There are no ocular or oral lesions.  There are no palpable cervical or supraclavicular lymph nodes.  Lungs:  Clear to percussion and auscultation bilaterally.  Cardiac:  Regular rate and rhythm with a normal S1 and S2.  There are no murmurs, rubs, or bruits.  Abdomen: Soft with good bowel sounds.  There is no palpable abdominal mass. There is no fluid wave.  There is no palpable hepatosplenomegaly.  Back: No tenderness over the spine, ribs, or hips.  Extremities:  No clubbing, cyanosis, or edema.  Neurological:  No focal neurological deficits.  LABORATORY STUDIES:  White cell count of 4.6, hemoglobin 13.9, hematocrit 40.8, and platelet count 171.  IMPRESSION:  Mr. Wegener is a 47 year old gentleman with stage III oligoastrocytoma of the left temporal lobe.  He has really done well with treatment.  He received chemoradiation therapy followed by full- dose Temodar.  Again, he goes to [facility] in a couple  weeks.  I think we can probably start moving his appointments out now.  We can get him every 2-3 months now.  I do want to see him after he goes to [facility] so that I can have the results from his brain studies.  We will go ahead and refill his Ritalin.  This will make it easier for him so that he does not have to come back.    ______________________________ Josph Macho, M.D. PRE/MEDQ  D:  12/09/2012  T:  12/10/2012  Job:  [id number]

## 2012-12-11 ENCOUNTER — Telehealth: Payer: Self-pay | Admitting: Hematology & Oncology

## 2012-12-11 NOTE — Telephone Encounter (Signed)
Left message for pt to call and schedule June appointments for after [facility] visit

## 2013-02-03 ENCOUNTER — Other Ambulatory Visit: Payer: Self-pay | Admitting: *Deleted

## 2013-02-03 ENCOUNTER — Encounter: Payer: Self-pay | Admitting: *Deleted

## 2013-02-03 DIAGNOSIS — C719 Malignant neoplasm of brain, unspecified: Secondary | ICD-10-CM

## 2013-02-03 MED ORDER — METHYLPHENIDATE HCL 10 MG PO TABS: 10.0000 mg | ORAL_TABLET | Freq: Two times a day (BID) | ORAL | Status: DC

## 2013-02-03 NOTE — Progress Notes (Signed)
error 

## 2013-02-03 NOTE — Telephone Encounter (Signed)
Pt called requesting a Ritalin refill. It was last filled on 12/09/12. He does not take it on the weekends and that Dr Myna Hidalgo is aware of this. Will route to him for approval and signature then will place at the front desk for pick up.

## 2013-02-18 ENCOUNTER — Ambulatory Visit: Payer: BC Managed Care – PPO | Admitting: Radiation Oncology

## 2013-02-18 ENCOUNTER — Telehealth: Payer: Self-pay | Admitting: *Deleted

## 2013-02-18 NOTE — Telephone Encounter (Signed)
Called patient to alter fu for 02-18-13, lvm on voice mail that the appt. Has been changed to 03-22-13.

## 2013-02-24 ENCOUNTER — Telehealth: Payer: Self-pay | Admitting: Hematology & Oncology

## 2013-02-24 NOTE — Telephone Encounter (Signed)
Pt called said he was out of [facility] and needs appointments. I transferred call to Amy RN she did not answer and he left a message for her

## 2013-03-06 IMAGING — CT CT HEAD W/O CM
1 of 2 series · 13 of 30 positions shown, 17 images · non-contrast
Comparison: None.

CLINICAL DATA: Seizure

CT HEAD WITHOUT CONTRAST
TECHNIQUE: Contiguous axial images were obtained from the base of
the skull through the vertex without contrast.

[Series 2: brain · axial · 0.49mm/px · z∈[+99,+244]mm · 13 of 32 slices shown, 17 images]
[im 3/32  brain]
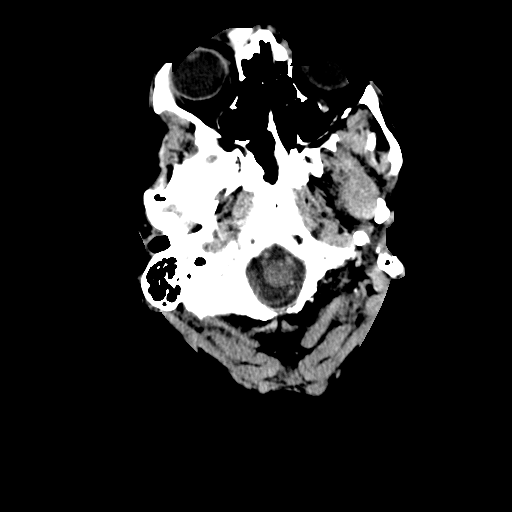
[im 3/32  bone]
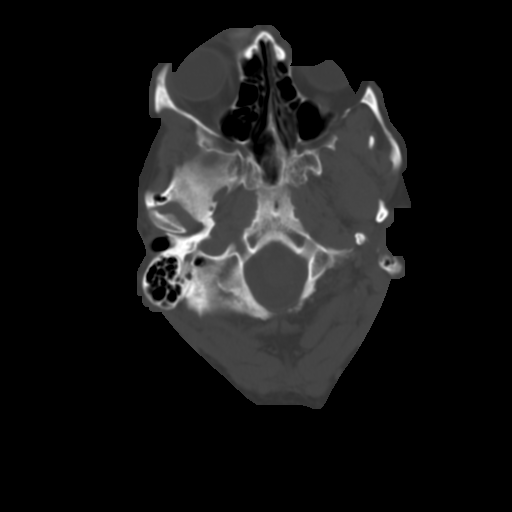
[im 5/32  brain]
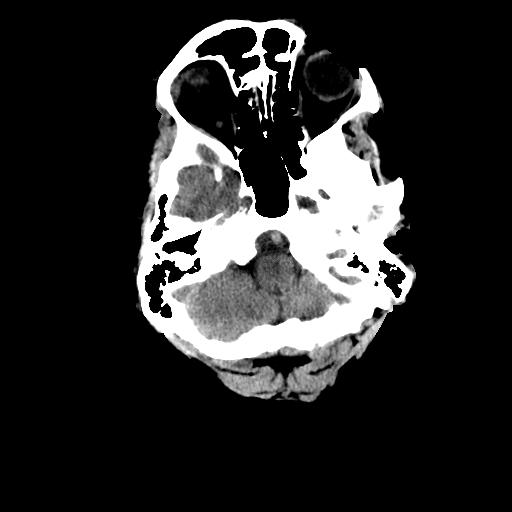
[im 7/32  brain]
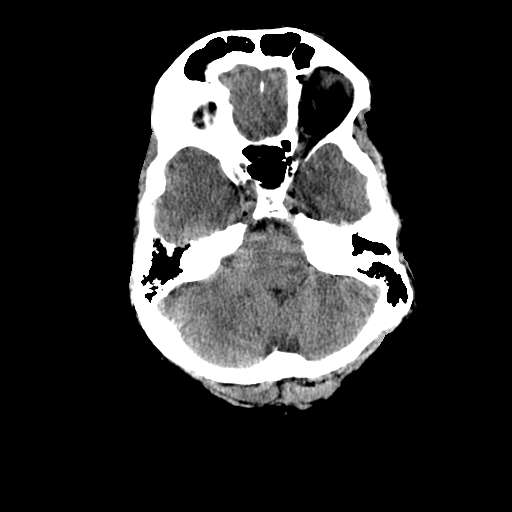
[im 9/32  brain]
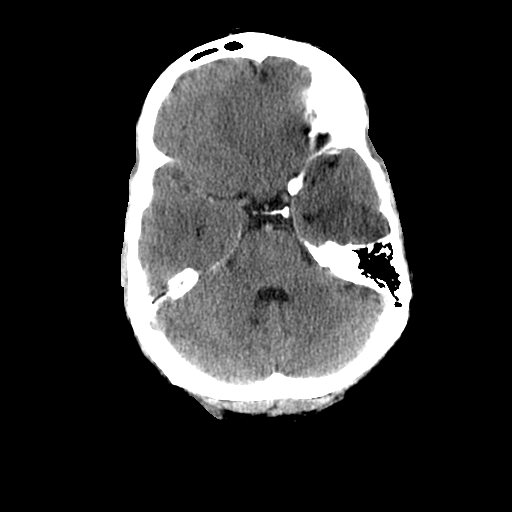
[im 12/32  brain]
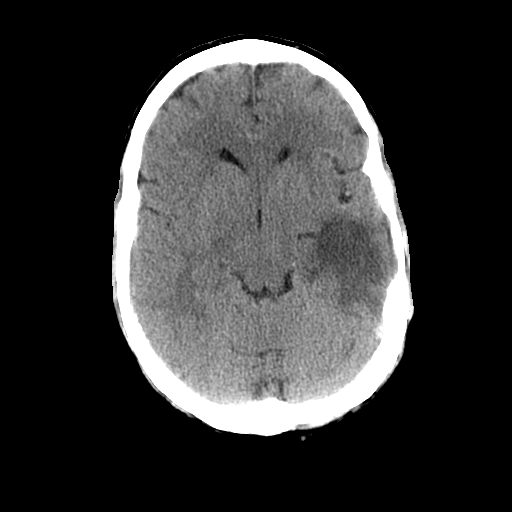
[im 12/32  bone]
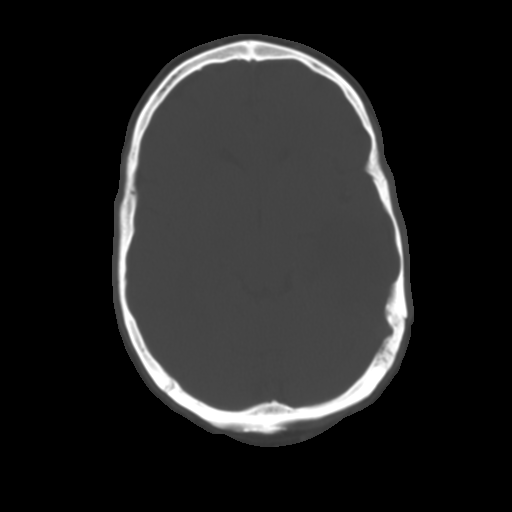
[im 14/32  brain]
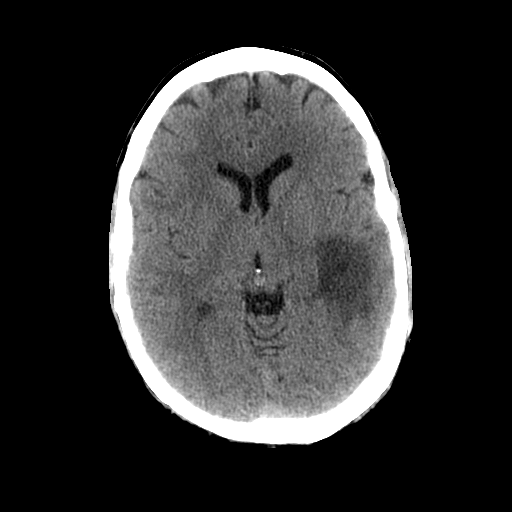
[im 16/32  brain]
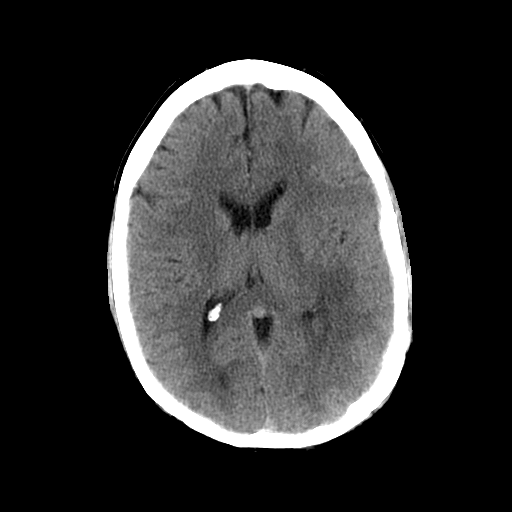
[im 18/32  brain]
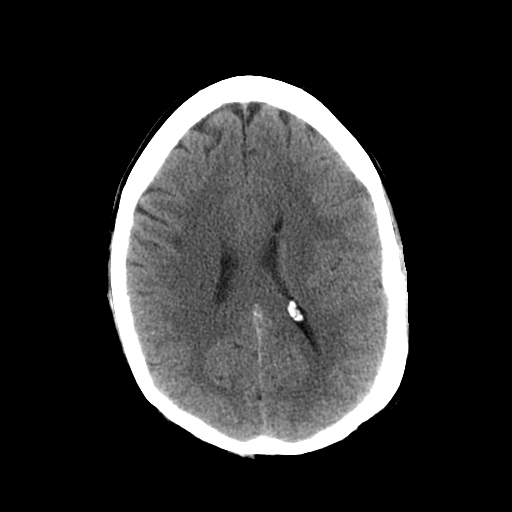
[im 20/32  brain]
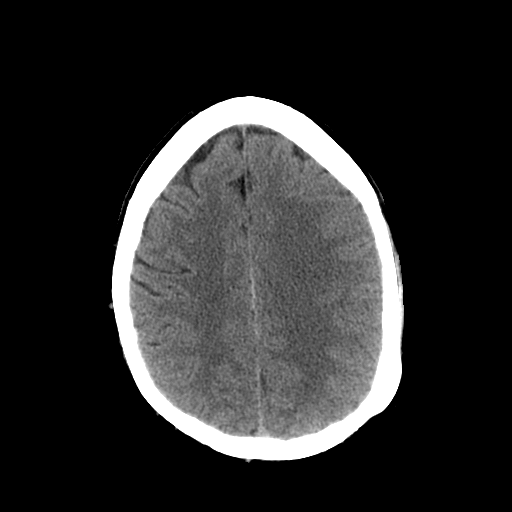
[im 20/32  bone]
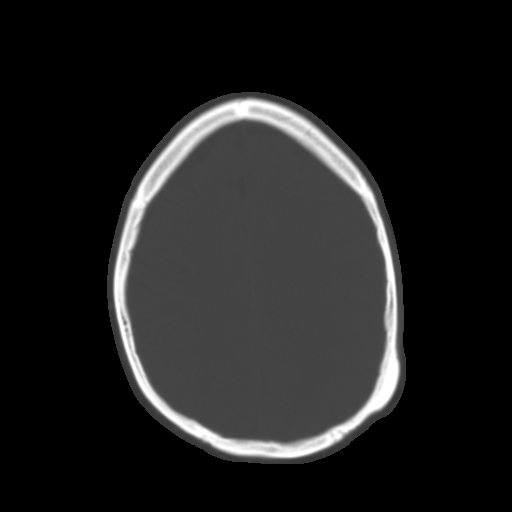
[im 23/32  brain]
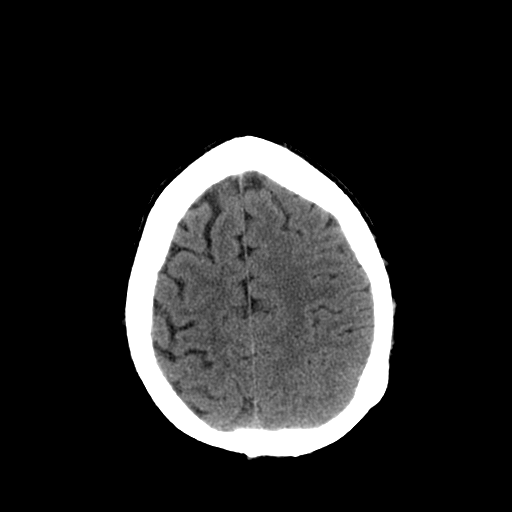
[im 25/32  brain]
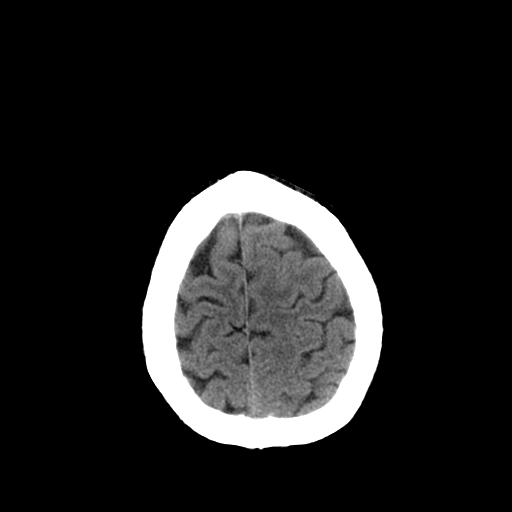
[im 27/32  brain]
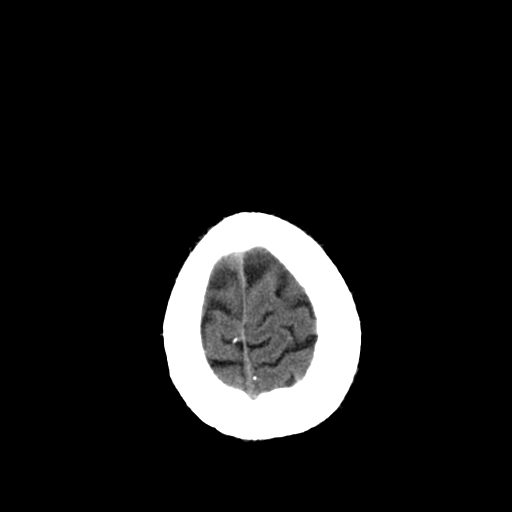
[im 29/32  brain]
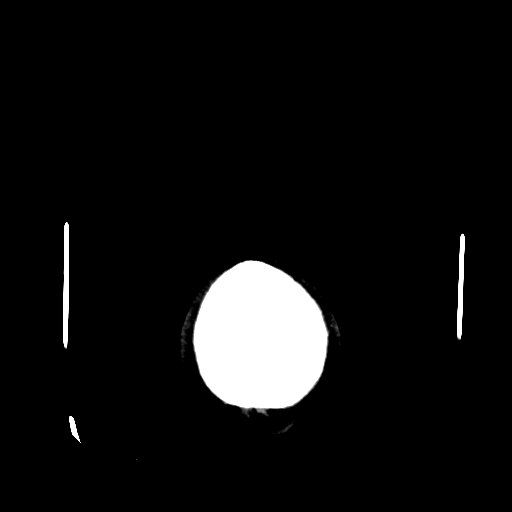
[im 29/32  bone]
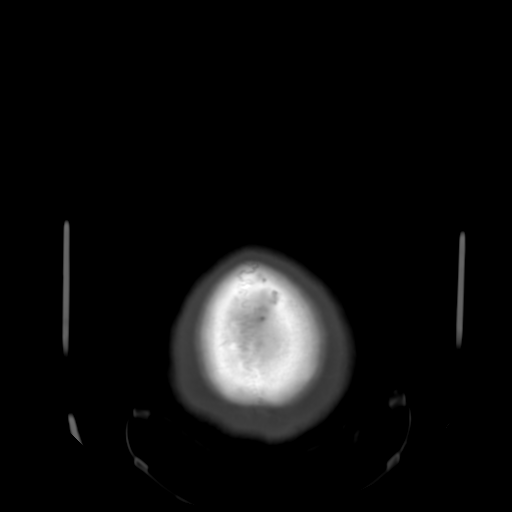

[13 of 30 positions shown; findings below may reference images not displayed]

FINDINGS: There is an approximately 3 cm x 4 cm hypodensity in the
left temporal lobe which extends to the cortical margin of the left
temporal operculum (images 16 through 9).  This finding is most
suggestive of a cortical infarction but cannot exclude an
underlying neoplasm.  There is no intracranial hemorrhage evident.
No midline shift or mass effect.  No hydrocephalus.

Paranasal sinuses and mastoid air cells are clear.  Orbits are
normal.
IMPRESSION: Probable cortical infarction within the left temporal lobe.  Cannot
exclude underlying neoplasm.  Recommend brain MRI for further
evaluation.

Findings discussed with Dr. Ah Choon on 09/01/2011 at 4544 hours

## 2013-03-09 ENCOUNTER — Other Ambulatory Visit: Payer: Self-pay | Admitting: *Deleted

## 2013-03-09 DIAGNOSIS — C719 Malignant neoplasm of brain, unspecified: Secondary | ICD-10-CM

## 2013-03-09 MED ORDER — METHYLPHENIDATE HCL 10 MG PO TABS: 10.0000 mg | ORAL_TABLET | Freq: Two times a day (BID) | ORAL | Status: DC

## 2013-03-09 NOTE — Telephone Encounter (Signed)
Pt called requesting a Ritalin refill. It was last filled on 02/03/13. He does not take it on the weekends and that Dr Myna Hidalgo is aware of this. Will route to him for approval and signature then will place at the front desk for pick up.

## 2013-03-10 ENCOUNTER — Other Ambulatory Visit: Payer: Self-pay | Admitting: *Deleted

## 2013-03-10 ENCOUNTER — Telehealth: Payer: Self-pay | Admitting: Hematology & Oncology

## 2013-03-10 NOTE — Telephone Encounter (Signed)
Per Amy to sch apt for patient.  Patient's wife called and sch lab on 03/25/13 and md apt for 03/30/13.

## 2013-03-22 ENCOUNTER — Encounter: Payer: Self-pay | Admitting: Radiation Oncology

## 2013-03-22 ENCOUNTER — Ambulatory Visit
Admission: RE | Admit: 2013-03-22 | Discharge: 2013-03-22 | Disposition: A | Discharge status: Home / Self Care | Payer: BC Managed Care – PPO | Source: Ambulatory Visit | Attending: Radiation Oncology | Admitting: Radiation Oncology

## 2013-03-22 VITALS — BP 128/87 | HR 85 | Temp 98.0°F | Resp 16 | Wt 211.0 lb

## 2013-03-22 DIAGNOSIS — G9389 Other specified disorders of brain: Secondary | ICD-10-CM

## 2013-03-22 NOTE — Addendum Note (Signed)
Encounter addended by: Oneita Hurt, MD on: 03/22/2013  1:04 PM<BR>     Documentation filed: Notes Section

## 2013-03-22 NOTE — Progress Notes (Addendum)
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  The recent films from [facility] reportedly show no recurrence.                            ALLERGIES:  has No Known Allergies.  Meds: Current Outpatient Prescriptions  Medication Sig Dispense Refill  . acetaminophen (TYLENOL) 650 MG CR tablet Take 650 mg by mouth every 8 (eight) hours as needed.      . Calcium Carbonate-Vitamin D (CALCIUM-CARB 600 + D PO) Take by mouth 2 (two) times daily.      Marland Kitchen docusate sodium (COLACE) 100 MG capsule Take 100 mg by mouth 3 (three) times daily as needed.       . levETIRAcetam (KEPPRA) 500 MG tablet TAKE 1 TABLET BY MOUTH TWICE A DAY  60 tablet  3  . methylphenidate (RITALIN) 10 MG tablet Take 1 tablet (10 mg total) by mouth 2 (two) times daily. 2 tabs in AM and 1 at noon  90 tablet  0  . Multiple Vitamins-Minerals (MULTIVITAMINS THER. W/MINERALS) TABS Take 1 tablet by mouth daily.       . sertraline (ZOLOFT) 50 MG tablet Take 100 mg by mouth daily.       . OMEGA-3 KRILL OIL PO Take 1 capsule by mouth daily.        . ondansetron (ZOFRAN) 8 MG tablet TAKE 1 TABLET (8 MG TOTAL) BY MOUTH EVERY 8 (EIGHT) HOURS AS NEEDED FOR NAUSEA.  30 tablet  3  . sulfamethoxazole-trimethoprim (BACTRIM DS) 800-160 MG per tablet TAKE 1 TABLET BY MOUTH EVERY DAY  30 tablet  6  . temozolomide (TEMODAR) 100 MG capsule May take on an empty stomach or at bedtime to decrease nausea & vomiting.Takes 4 100 mg.for 5 days then off for 23 days        No current facility-administered medications for this encounter.   Facility-Administered Medications Ordered in Other Encounters  Medication Dose Route Frequency Provider Last Rate Last Dose  . topical emolient (BIAFINE) emulsion   Topical PRN Oneita Hurt, MD        Physical Findings: The patient is in no acute distress. Patient is alert and oriented.  weight is 211 lb (95.709 kg). His oral temperature is 98 F (36.7 C). His blood pressure is 128/87 and his pulse is 85. His respiration is 16 and oxygen saturation is 100%. .  No significant changes.  [facility] MRI REPORT: MRI brain with and without contrast  Narrative ** MR BRAIN **  INDICATION: 191.1 Malignant neoplasm of frontal lobe of brain, oligoastrocytoma follow progression provided. History of left temporal anaplastic oligoastrocytoma.  COMPARISON: 06/23/2012.  TECHNIQUE/PROTOCOL: Standard adult brain protocol.  CONTRAST: 8.90mL Gadavist IV. This MRI was performed before and after IV administration of contrast material, which was deemed necessary given the patient's indication for the examination. If IV contrast had not been administered the likelihood of detecting abnormalities relevant to the patient's condition would have been substantially decreased. Creatinine 0.9  on 06/23/2012 with GFR greater than 60. No immediate patient complications or events noted.  FINDINGS: There has been prior left temporal craniotomy with a resection cavity in the left temporal lobe. There are some mild areas of linear enhancement along the inferior and medial aspects of the resection cavity which are unchanged. There is mild surrounding T2 signal abnormality in the left temporal lobe which is unchanged. There are a few scattered foci of T2 signal abnormality in the periventricular white matter, corona radiata, and centrum semiovale which are unchanged.  There is no acute cortical infarct or intracranial hemorrhage The extra-axial  spaces, basal cisterns, ventricles and sulci are normal. Intracranial flow-voids appear normal. The orbits, mastoid sinuses, and visualized paranasal sinuses are unremarkable.  IMPRESSION: Stable MRI examination, with a resection cavity in the left temporal lobe. No significant change since the prior study.  I have reviewed the images and concur with the above findings.  Electronically Reviewed by: Jaynee Eagles, MD Electronically Reviewed on: 08/18/2012 2:27 PM      Impression:  The patient has no evidence of recurrence.  Plan:  Follow-up in one year.  _____________________________________  Artist Pais. Kathrynn Running, M.D

## 2013-03-22 NOTE — Progress Notes (Signed)
Reports mild fatigue. Has just returned from vacation. Denies nausea, vomiting, headache, or dizziness. Denies confusion. Denies memory changes. Answers appropriately and quickly to questions. Denies diplopia, floaters, or ringing in the ears. 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 Denies taking steroids at this time. Continues to take Keppra. Denies any seizure activity.

## 2013-03-25 ENCOUNTER — Other Ambulatory Visit (HOSPITAL_BASED_OUTPATIENT_CLINIC_OR_DEPARTMENT_OTHER): Payer: BC Managed Care – PPO | Admitting: Lab

## 2013-03-25 DIAGNOSIS — C719 Malignant neoplasm of brain, unspecified: Secondary | ICD-10-CM

## 2013-03-25 LAB — CHCC SATELLITE - SMEAR

## 2013-03-25 LAB — CBC WITH DIFFERENTIAL (CANCER CENTER ONLY)
Eosinophils Absolute: 0.2 10*3/uL (ref 0.0–0.5)
LYMPH%: 16.4 % (ref 14.0–48.0)
MCH: 29.8 pg (ref 28.0–33.4)
MCV: 88 fL (ref 82–98)
MONO%: 8.5 % (ref 0.0–13.0)
Platelets: 161 10*3/uL (ref 145–400)
RBC: 4.9 10*6/uL (ref 4.20–5.70)
RDW: 12.4 % (ref 11.1–15.7)

## 2013-03-25 LAB — CMP (CANCER CENTER ONLY)
Alkaline Phosphatase: 74 U/L (ref 26–84)
Creat: 1 mg/dl (ref 0.6–1.2)
Glucose, Bld: 92 mg/dL (ref 73–118)
Sodium: 144 mEq/L (ref 128–145)
Total Bilirubin: 0.8 mg/dl (ref 0.20–1.60)
Total Protein: 6.5 g/dL (ref 6.4–8.1)

## 2013-03-30 ENCOUNTER — Other Ambulatory Visit (HOSPITAL_BASED_OUTPATIENT_CLINIC_OR_DEPARTMENT_OTHER): Payer: BC Managed Care – PPO | Admitting: Lab

## 2013-03-30 ENCOUNTER — Other Ambulatory Visit: Payer: Self-pay | Admitting: Hematology & Oncology

## 2013-03-30 ENCOUNTER — Ambulatory Visit (HOSPITAL_BASED_OUTPATIENT_CLINIC_OR_DEPARTMENT_OTHER): Payer: BC Managed Care – PPO | Admitting: Hematology & Oncology

## 2013-03-30 VITALS — BP 135/87 | HR 75 | Temp 97.6°F | Resp 18 | Ht 70.0 in | Wt 208.0 lb

## 2013-03-30 DIAGNOSIS — R5381 Other malaise: Secondary | ICD-10-CM

## 2013-03-30 DIAGNOSIS — R5383 Other fatigue: Secondary | ICD-10-CM

## 2013-03-30 DIAGNOSIS — C719 Malignant neoplasm of brain, unspecified: Secondary | ICD-10-CM

## 2013-03-30 DIAGNOSIS — R7989 Other specified abnormal findings of blood chemistry: Secondary | ICD-10-CM

## 2013-03-30 HISTORY — DX: Other specified abnormal findings of blood chemistry: R79.89

## 2013-03-30 LAB — TESTOSTERONE: Testosterone: 279 ng/dL — ABNORMAL LOW (ref 300–890)

## 2013-03-30 NOTE — Progress Notes (Signed)
This office note has been dictated.

## 2013-03-31 ENCOUNTER — Telehealth: Payer: Self-pay | Admitting: *Deleted

## 2013-03-31 MED ORDER — TESTOSTERONE 20.25 MG/1.25GM (1.62%) TD GEL: 20.2500 mg | Freq: Every day | TRANSDERMAL | Status: DC

## 2013-03-31 NOTE — Telephone Encounter (Signed)
Message copied by Anselm Jungling on Wed Mar 31, 2013 10:01 AM ------      Message from: Josph Macho      Created: Tue Mar 30, 2013  9:00 PM       Call - testosterone is low!!  I will order Androgel.  He needs to pick this up in the office. Pete ------

## 2013-03-31 NOTE — Telephone Encounter (Signed)
Called patient and left message on personal cell phone that his testosterone levels were low per dr. Myna Hidalgo.  Dr. Lupita Leash wrote an Androgel Rx for patient to pick up at front desk in our office

## 2013-03-31 NOTE — Progress Notes (Signed)
CC:   Joycelyn Rua, M.D. [name], M.D. [name], MD  DIAGNOSIS:  Grade 3 oligoastrocytoma of the left temporal lobe.  CURRENT THERAPY:  Observation.  INTERIM HISTORY:  Nathan Prince comes in for followup.  He is doing okay. He has a pretty low libido.  We will see about his testosterone levels.  He has had no nausea or vomiting.  There has been no cough or shortness of breath.  He has had no fevers, sweats, or chills.  He continues on Ritalin.  He also is on Zoloft.  He was started on Cialis.  He has not had any problems with fevers.  He has had no rashes.  PHYSICAL EXAMINATION:  General:  This is a well-developed, well- nourished white gentleman in no obvious distress.  Vital signs: Temperature of 97.6, pulse 75, respiratory rate 18, blood pressure 135/87.  Weight is 208.  Head and neck:  Normocephalic, atraumatic skull.  There are no ocular or oral lesions.  There are no palpable cervical or supraclavicular lymph nodes.  Lungs:  Clear bilaterally. Cardiac:  Regular rate and rhythm with a normal S1 and S2.  There are no murmurs, rubs or bruits.  Abdomen:  Soft with good bowel sounds.  There is no palpable abdominal mass.  There is no fluid wave.  There is no palpable hepatosplenomegaly.  Extremities:  Show no clubbing, cyanosis or edema.  He has good strength in his arms and legs bilaterally. Neurological:  Shows no focal neurological deficit.  LABORATORY STUDIES:  White cell count is 5.7, hemoglobin 14.6, hematocrit 43, platelet count 161.  His sodium 144, potassium 4.1, BUN 11, creatinine 1.0.  LFTs are normal.  IMPRESSION:  Nathan Prince is a 47 year old gentleman with resected oligoastrocytoma of the left temporal lobe.  He completed his treatments back in January of this year.  I think we will have to go ahead and get him checked for his testosterone level.  I also want to check his vitamin B12 and thyroid.  He should be going to [facility] I think next month.   He typically goes every 2 months for scans.  Hopefully, we will be able to help the libido issues.  I want to see him back myself in another 3 months.    ______________________________ Josph Macho, M.D. PRE/MEDQ  D:  03/30/2013  T:  03/30/2013  Job:  [id number]

## 2013-04-06 ENCOUNTER — Other Ambulatory Visit: Payer: Self-pay | Admitting: Hematology & Oncology

## 2013-04-06 ENCOUNTER — Encounter: Payer: Self-pay | Admitting: Hematology & Oncology

## 2013-04-06 ENCOUNTER — Other Ambulatory Visit: Payer: Self-pay | Admitting: *Deleted

## 2013-04-22 ENCOUNTER — Other Ambulatory Visit: Payer: Self-pay | Admitting: *Deleted

## 2013-04-22 DIAGNOSIS — C719 Malignant neoplasm of brain, unspecified: Secondary | ICD-10-CM

## 2013-04-22 MED ORDER — METHYLPHENIDATE HCL 10 MG PO TABS: 10.0000 mg | ORAL_TABLET | Freq: Two times a day (BID) | ORAL | Status: DC

## 2013-04-26 IMAGING — US US EXTREM LOW VENOUS BILAT
1 series · 13 of 24 positions shown · non-contrast
Comparison: None.

CLINICAL DATA: History of glioma, now with occasional leg swelling,
evaluate for DVT prior to initiating radiation and chemotherapy.

BILATERAL LOWER EXTREMITY VENOUS DUPLEX ULTRASOUND
TECHNIQUE: Gray-scale sonography with graded compression, as well
as color Doppler and duplex ultrasound, were performed to evaluate
the deep venous system of both lower extremities from the level of
the common femoral vein through the popliteal and proximal calf
veins.  Spectral Doppler was utilized to evaluate flow at rest and
with distal augmentation maneuvers.

[Series 1: us extrem low venous bilat · 13 of 46 slices shown]
[im 1/46]
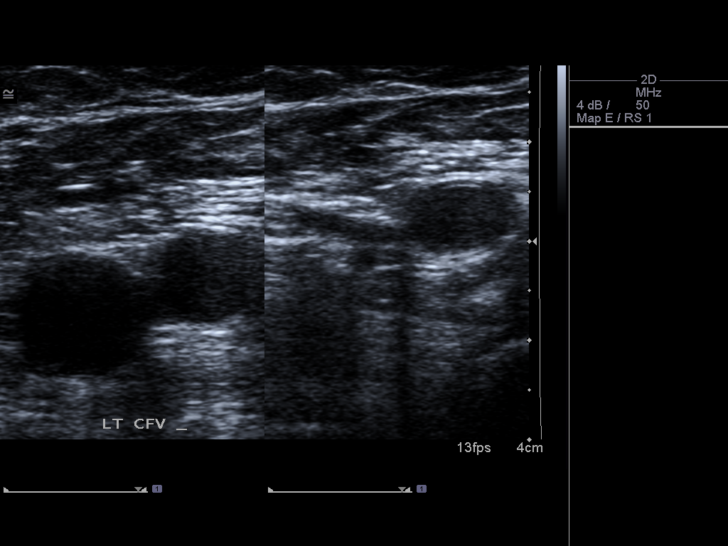
[im 4/46]
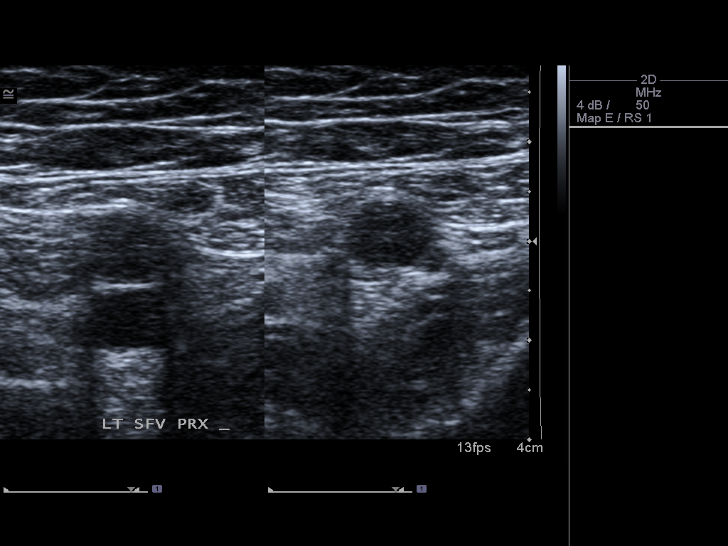
[im 8/46]
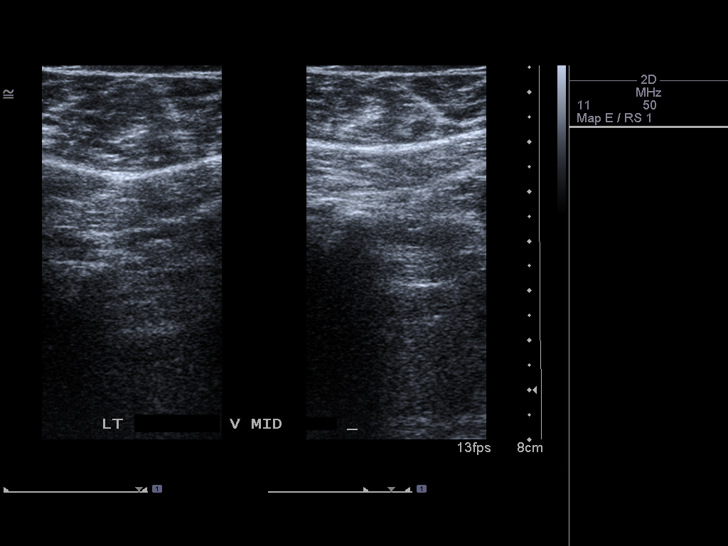
[im 12/46]
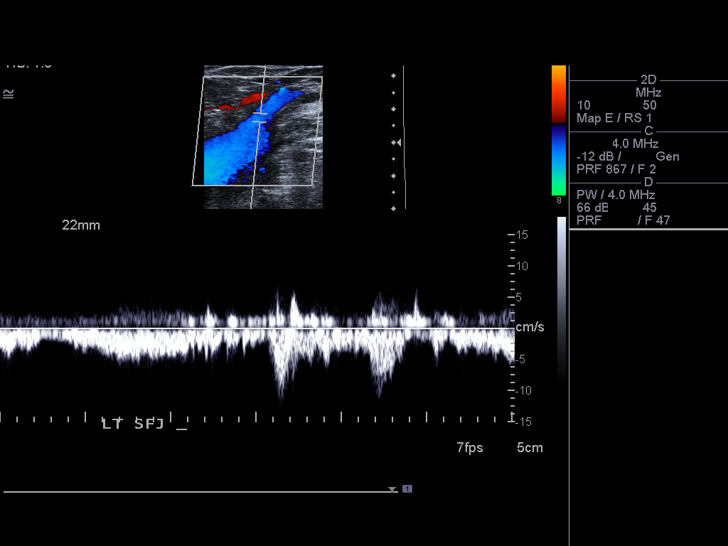
[im 16/46]
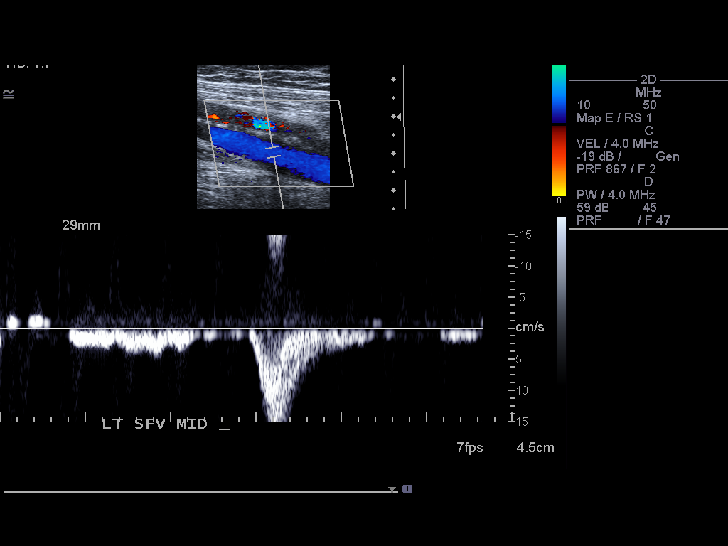
[im 20/46]
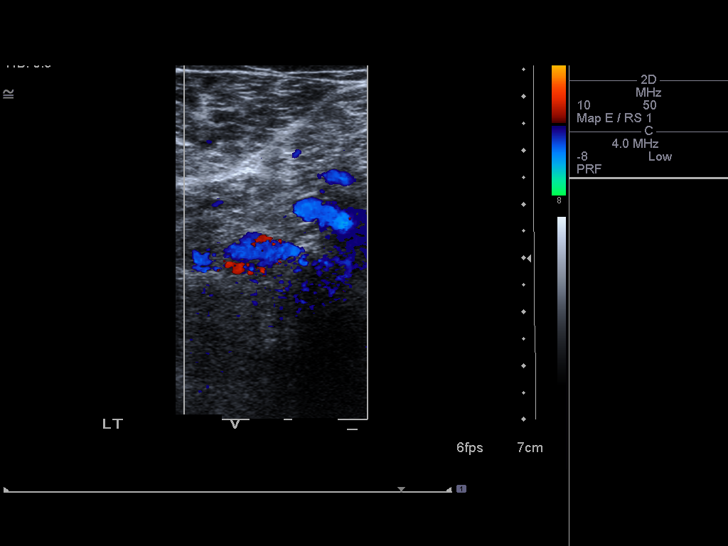
[im 24/46]
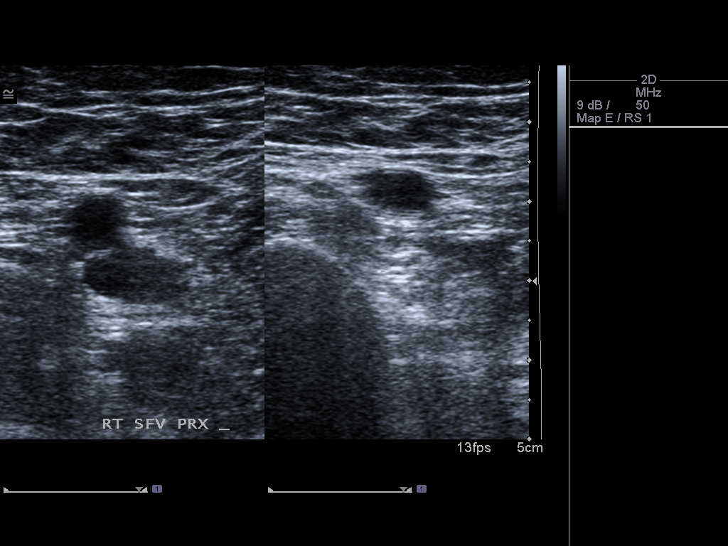
[im 26/46]
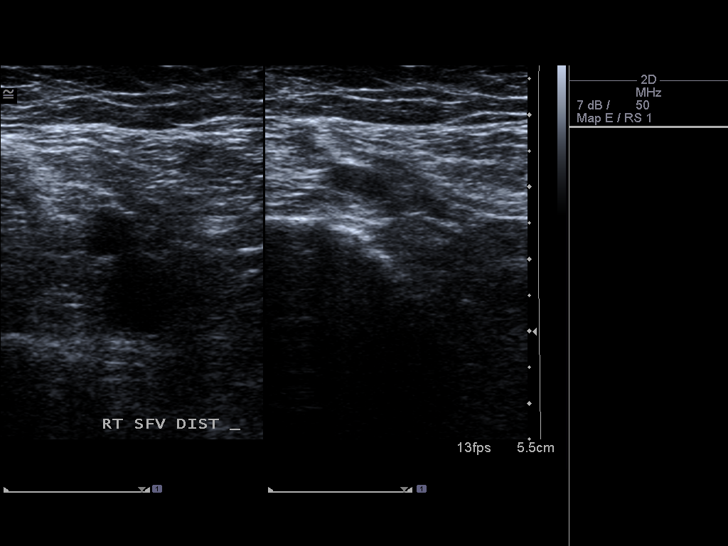
[im 30/46]
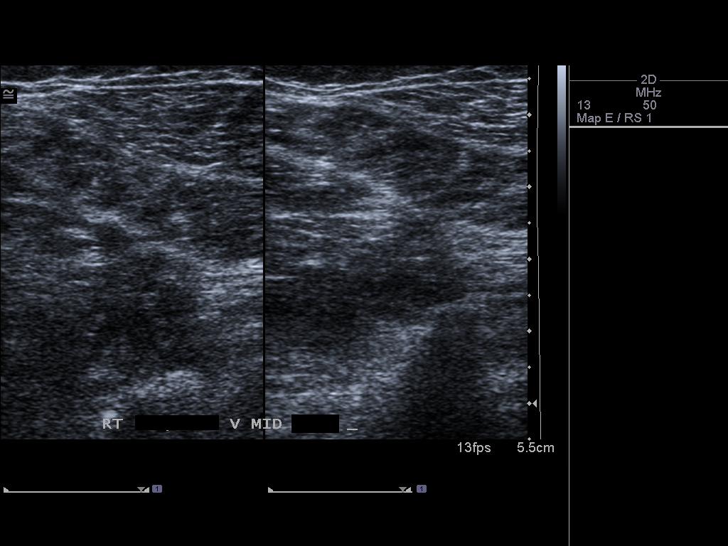
[im 34/46]
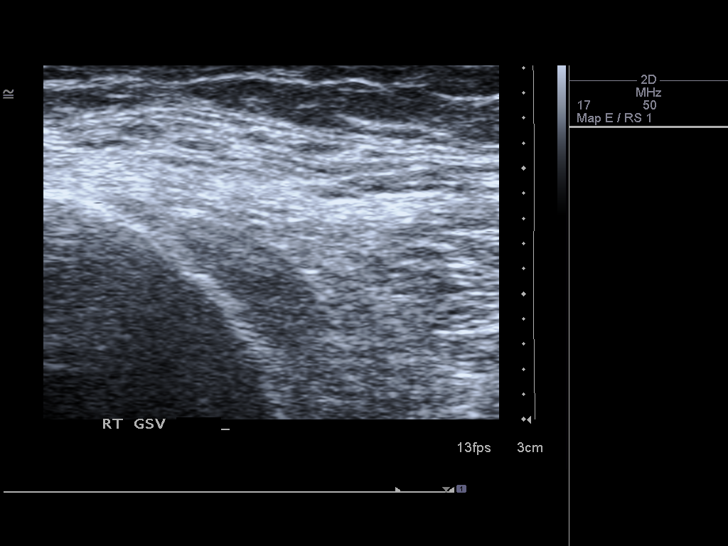
[im 38/46]
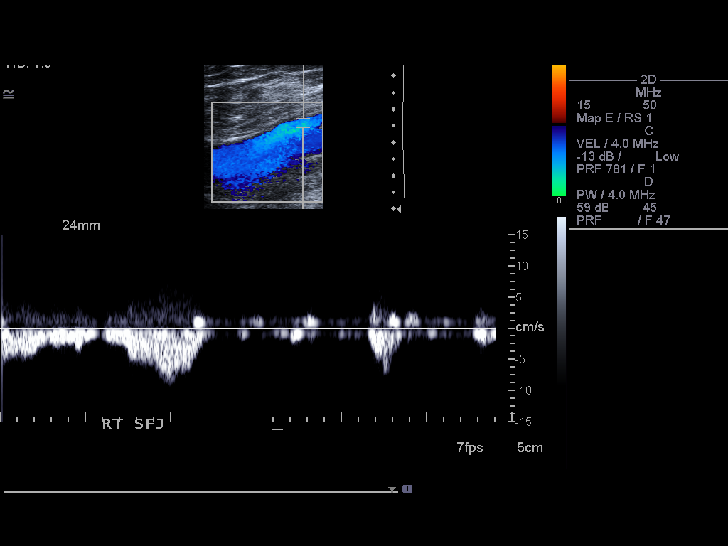
[im 42/46]
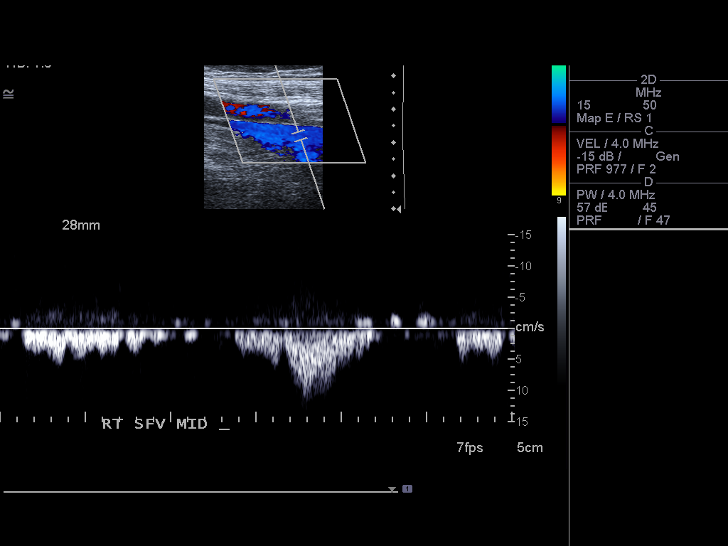
[im 46/46]
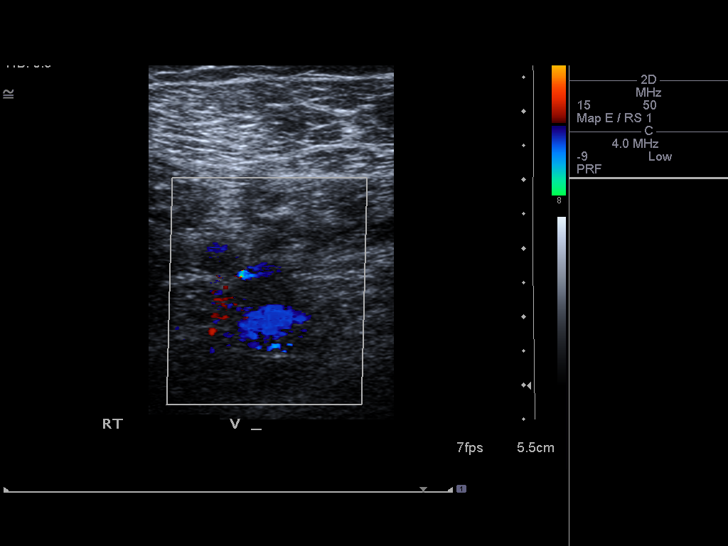

[13 of 24 positions shown; findings below may reference images not displayed]

FINDINGS: Normal compressibility of bilateral common femoral,
superficial femoral, and popliteal veins is demonstrated, as well
as the visualized proximal calf veins.  No filling defects to
suggest DVT on grayscale or color Doppler imaging.  Doppler
waveforms show normal direction of venous flow, normal respiratory
phasicity and response to augmentation.

The greater saphenous vein is not seen at the level of the knee or
calf, however is seen at the level of the ankle but is
noncompressible at this location.
IMPRESSION: 1.  No evidence of deep vein thrombosis within either lower
extremity.

2.  The right-sided greater saphenous vein is not seen at the level
of the knee or calf, however is seen at the level of the ankle but
is noncompressible at this location.  Correlation for prior vein
harvesting or ablation is recommended.

## 2013-05-06 ENCOUNTER — Telehealth: Payer: Self-pay | Admitting: Hematology & Oncology

## 2013-05-06 NOTE — Telephone Encounter (Signed)
[employer] APPROVED Methylphenidate HCI 10MG  Tablets.  Berkley Harvey Nbr: [id number] Valid: 03/13/2012-05/05/2014    COPY SCANNED

## 2013-06-14 ENCOUNTER — Other Ambulatory Visit: Payer: Self-pay | Admitting: *Deleted

## 2013-06-14 DIAGNOSIS — C719 Malignant neoplasm of brain, unspecified: Secondary | ICD-10-CM

## 2013-06-15 MED ORDER — METHYLPHENIDATE HCL 10 MG PO TABS: 10.0000 mg | ORAL_TABLET | Freq: Two times a day (BID) | ORAL | Status: DC

## 2013-06-15 NOTE — Telephone Encounter (Signed)
Pt called requesting a Ritalin refill. It was last filled on 04/22/13. He does not take it on the weekends and that Dr Myna Hidalgo is aware of this. Will route to him for approval and signature then will place at the front desk for pick up.

## 2013-06-30 ENCOUNTER — Other Ambulatory Visit: Payer: Self-pay | Admitting: Lab

## 2013-06-30 ENCOUNTER — Telehealth: Payer: Self-pay | Admitting: Hematology & Oncology

## 2013-06-30 NOTE — Telephone Encounter (Signed)
Left message on home and cell  for pt to call to reschedule missed 10-22 lab

## 2013-07-05 ENCOUNTER — Other Ambulatory Visit (HOSPITAL_BASED_OUTPATIENT_CLINIC_OR_DEPARTMENT_OTHER): Payer: BC Managed Care – PPO | Admitting: Lab

## 2013-07-05 ENCOUNTER — Ambulatory Visit (HOSPITAL_BASED_OUTPATIENT_CLINIC_OR_DEPARTMENT_OTHER): Payer: BC Managed Care – PPO | Admitting: Hematology & Oncology

## 2013-07-05 VITALS — BP 134/87 | HR 96 | Temp 97.5°F | Resp 18 | Ht 70.0 in | Wt 217.0 lb

## 2013-07-05 DIAGNOSIS — R5381 Other malaise: Secondary | ICD-10-CM

## 2013-07-05 DIAGNOSIS — R7989 Other specified abnormal findings of blood chemistry: Secondary | ICD-10-CM

## 2013-07-05 DIAGNOSIS — C719 Malignant neoplasm of brain, unspecified: Secondary | ICD-10-CM

## 2013-07-05 DIAGNOSIS — Z85841 Personal history of malignant neoplasm of brain: Secondary | ICD-10-CM

## 2013-07-05 DIAGNOSIS — R5383 Other fatigue: Secondary | ICD-10-CM

## 2013-07-05 DIAGNOSIS — I10 Essential (primary) hypertension: Secondary | ICD-10-CM

## 2013-07-05 DIAGNOSIS — E291 Testicular hypofunction: Secondary | ICD-10-CM

## 2013-07-05 LAB — CBC WITH DIFFERENTIAL (CANCER CENTER ONLY)
BASO%: 0.5 % (ref 0.0–2.0)
Eosinophils Absolute: 0.1 10*3/uL (ref 0.0–0.5)
LYMPH%: 24.6 % (ref 14.0–48.0)
MCH: 29.3 pg (ref 28.0–33.4)
MCHC: 34.4 g/dL (ref 32.0–35.9)
MCV: 85 fL (ref 82–98)
MONO%: 7.1 % (ref 0.0–13.0)
Platelets: 192 10*3/uL (ref 145–400)
RDW: 12.4 % (ref 11.1–15.7)

## 2013-07-05 LAB — CMP (CANCER CENTER ONLY)
ALT(SGPT): 31 U/L (ref 10–47)
Alkaline Phosphatase: 72 U/L (ref 26–84)
BUN, Bld: 15 mg/dL (ref 7–22)
CO2: 28 mEq/L (ref 18–33)
Calcium: 9.3 mg/dL (ref 8.0–10.3)
Chloride: 108 mEq/L (ref 98–108)
Creat: 1 mg/dl (ref 0.6–1.2)
Potassium: 4 mEq/L (ref 3.3–4.7)
Total Bilirubin: 0.5 mg/dl (ref 0.20–1.60)

## 2013-07-05 NOTE — Progress Notes (Signed)
This office note has been dictated.

## 2013-07-13 NOTE — Progress Notes (Signed)
CC:   Joycelyn Rua, M.D. [name], M.D.  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  CURRENT THERAPY:  Observation.  INTERIM HISTORY:  Mr. Yabut comes in for his followup.  He is actually doing very well.  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  He is having a little more energy.  He is on Ritalin.  When we last saw him, he was complaining of some low libido.  We checked his testosterone, which was slightly decreased.  We did put him on some AndroGel.  This does seem to make him feel a little bit better.  He is on Keppra.  He has had no problem with seizures.  He was last seen at [facility] in August of 2014.  I think he had MRI done at that time.  There was no evidence of recurrence with a stable resection cavity.  He has had no problem with bowel or bladder.  He has had no weakness. His speech has been doing very well.  PHYSICAL EXAMINATION:  General:  This is a well-developed, well- nourished white gentleman in no obvious distress.  Vital signs: Temperature of 97.5, pulse 96, respiratory rate 18, blood pressure 134/87, weight is 217 pounds.  Head and Neck:  Normocephalic, atraumatic skull.  There are no ocular or oral lesions.  There are no palpable cervical or supraclavicular lymph nodes.  Lungs:  Clear bilaterally. Cardiac:  Regular rate and rhythm with normal S1 and S2.  There are no murmurs, rubs, or bruits.  Abdomen:  Soft.  He has good bowel sounds. There is no fluid wave.  There is no palpable hepatosplenomegaly.  Back: No tenderness over the spine, ribs, or hips.  Neurologic:  No focal neurological deficits.  LABORATORY STUDIES:  White cell count is 6.7, hemoglobin 15.2, hematocrit 44.2, platelet count 192.  IMPRESSION:  Mr. Savarese is a nice 47 year old gentleman with a resected oligoastrocytoma of the left temporal lobe.  He received adjuvant chemo and radiation therapy, then received adjuvant Temodar.  He completed  his treatments back in January of 2014.  So far, I do not see any evidence of clinical recurrence, which I am happy about.  I am sure he probably goes back to [facility] before the end of the year.  I am sure they will be doing an MRI on him.  I will plan to see him back in another 3 months.  I think we can probably get him through all the holidays before we have to get him back.    ______________________________ Josph Macho, M.D. PRE/MEDQ  D:  07/12/2013  T:  07/13/2013  Job:  [id number]

## 2013-08-02 ENCOUNTER — Other Ambulatory Visit: Payer: Self-pay | Admitting: *Deleted

## 2013-08-02 DIAGNOSIS — C719 Malignant neoplasm of brain, unspecified: Secondary | ICD-10-CM

## 2013-08-02 MED ORDER — METHYLPHENIDATE HCL 10 MG PO TABS: 10.0000 mg | ORAL_TABLET | Freq: Two times a day (BID) | ORAL | Status: DC

## 2013-08-10 ENCOUNTER — Other Ambulatory Visit: Payer: Self-pay | Admitting: Hematology & Oncology

## 2013-09-13 ENCOUNTER — Other Ambulatory Visit: Payer: Self-pay | Admitting: *Deleted

## 2013-09-13 DIAGNOSIS — C719 Malignant neoplasm of brain, unspecified: Secondary | ICD-10-CM

## 2013-09-13 MED ORDER — METHYLPHENIDATE HCL 10 MG PO TABS: 10.0000 mg | ORAL_TABLET | Freq: Two times a day (BID) | ORAL | Status: DC

## 2013-09-13 NOTE — Telephone Encounter (Signed)
Pt called requesting a Ritalin refill. It was last filled on 08/02/13. He does not take it on the weekends and that Dr Marin Olp is aware of this. Will route to him for approval and signature then will place at the front desk for pick up.

## 2013-09-30 ENCOUNTER — Other Ambulatory Visit: Payer: Self-pay | Admitting: Lab

## 2013-10-04 ENCOUNTER — Encounter: Payer: Self-pay | Admitting: Hematology & Oncology

## 2013-10-04 ENCOUNTER — Other Ambulatory Visit (HOSPITAL_BASED_OUTPATIENT_CLINIC_OR_DEPARTMENT_OTHER): Payer: BC Managed Care – PPO | Admitting: Lab

## 2013-10-04 ENCOUNTER — Ambulatory Visit (HOSPITAL_BASED_OUTPATIENT_CLINIC_OR_DEPARTMENT_OTHER): Payer: Self-pay | Admitting: Hematology & Oncology

## 2013-10-04 VITALS — BP 105/78 | HR 83 | Temp 97.8°F | Resp 18 | Ht 70.0 in | Wt 222.0 lb

## 2013-10-04 DIAGNOSIS — R7989 Other specified abnormal findings of blood chemistry: Secondary | ICD-10-CM

## 2013-10-04 DIAGNOSIS — C712 Malignant neoplasm of temporal lobe: Secondary | ICD-10-CM

## 2013-10-04 DIAGNOSIS — F32A Depression, unspecified: Secondary | ICD-10-CM

## 2013-10-04 DIAGNOSIS — E291 Testicular hypofunction: Secondary | ICD-10-CM

## 2013-10-04 DIAGNOSIS — I10 Essential (primary) hypertension: Secondary | ICD-10-CM

## 2013-10-04 DIAGNOSIS — C719 Malignant neoplasm of brain, unspecified: Secondary | ICD-10-CM

## 2013-10-04 DIAGNOSIS — F329 Major depressive disorder, single episode, unspecified: Secondary | ICD-10-CM

## 2013-10-04 LAB — CBC WITH DIFFERENTIAL (CANCER CENTER ONLY)
BASO#: 0 10*3/uL (ref 0.0–0.2)
BASO%: 0.4 % (ref 0.0–2.0)
EOS%: 1.3 % (ref 0.0–7.0)
Eosinophils Absolute: 0.1 10*3/uL (ref 0.0–0.5)
HCT: 44.3 % (ref 38.7–49.9)
HGB: 14.7 g/dL (ref 13.0–17.1)
LYMPH#: 1.5 10*3/uL (ref 0.9–3.3)
LYMPH%: 19 % (ref 14.0–48.0)
MCH: 28.5 pg (ref 28.0–33.4)
MCHC: 33.2 g/dL (ref 32.0–35.9)
MCV: 86 fL (ref 82–98)
MONO#: 0.4 10*3/uL (ref 0.1–0.9)
MONO%: 5.4 % (ref 0.0–13.0)
NEUT%: 73.9 % (ref 40.0–80.0)
NEUTROS ABS: 5.9 10*3/uL (ref 1.5–6.5)
PLATELETS: 226 10*3/uL (ref 145–400)
RBC: 5.15 10*6/uL (ref 4.20–5.70)
RDW: 12.8 % (ref 11.1–15.7)
WBC: 8 10*3/uL (ref 4.0–10.0)

## 2013-10-04 LAB — COMPREHENSIVE METABOLIC PANEL
ALBUMIN: 4.4 g/dL (ref 3.5–5.2)
ALK PHOS: 85 U/L (ref 39–117)
ALT: 27 U/L (ref 0–53)
AST: 20 U/L (ref 0–37)
BUN: 11 mg/dL (ref 6–23)
CO2: 26 mEq/L (ref 19–32)
Calcium: 9.5 mg/dL (ref 8.4–10.5)
Chloride: 104 mEq/L (ref 96–112)
Creatinine, Ser: 0.96 mg/dL (ref 0.50–1.35)
Glucose, Bld: 104 mg/dL — ABNORMAL HIGH (ref 70–99)
Potassium: 4.1 mEq/L (ref 3.5–5.3)
SODIUM: 138 meq/L (ref 135–145)
TOTAL PROTEIN: 6.6 g/dL (ref 6.0–8.3)
Total Bilirubin: 0.3 mg/dL (ref 0.3–1.2)

## 2013-10-04 LAB — TESTOSTERONE: TESTOSTERONE: 255 ng/dL — AB (ref 300–890)

## 2013-10-04 MED ORDER — METHYLPHENIDATE HCL 10 MG PO TABS
10.0000 mg | ORAL_TABLET | Freq: Two times a day (BID) | ORAL | Status: DC
Start: 2013-10-04 — End: 2013-12-06

## 2013-10-04 NOTE — Addendum Note (Signed)
Addended by: Burney Gauze R on: 10/04/2013 05:40 PM   Modules accepted: Orders

## 2013-10-04 NOTE — Progress Notes (Signed)
This office note has been dictated.

## 2013-10-05 NOTE — Progress Notes (Signed)
CC:   Leeroy Cha, M.D. Orpah Melter, M.D.  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  CURRENT THERAPY:  Observation.  INTERIM HISTORY:  Mr. Arquette comes in for followup.  He is doing quite well.  He was seen at [facility] recently.  He had an MRI there.  He sees Dr. Ferdinand Lango there.  The MRI was done on January 13th.  The MRI did not show any evidence of recurrent disease.  Everything looked fantastic in the left temporal lobe.  He is doing great.  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  He has really come a long way since we first saw him.  He has had no nausea or vomiting.  There has been no cough or shortness of breath.  He has had no change in bowel or bladder habits.  He is trying to exercise a little bit.  He is on testosterone replacement therapy.  He also is on Ritalin. These have made him feel a little bit better.  We will be checking testosterone level on him when we see him back with his next appointment.  PHYSICAL EXAMINATION:  General:  This is a well-developed, well- nourished white gentleman in no obvious distress.  Vital Signs: Temperature of 97.8, pulse 83, respiratory rate 18, blood pressure 105/78.  Weight is 225 pounds.  Head and Neck:  Normocephalic, atraumatic skull.  There are no ocular or oral lesions.  There are no palpable cervical or supraclavicular lymph nodes.  Lungs:  Clear bilaterally.  Cardiac:  Regular rate and rhythm with normal S1, S2. There are no murmurs, rubs, or bruits.  Abdomen:  Soft.  He has good bowel sounds.  There is no fluid wave.  There is no palpable abdominal mass.  There is no palpable hepatosplenomegaly.  Back:  No tenderness over the spine, ribs, or hips.  Extremities:  Show no clubbing, cyanosis, or edema.  He has good strength in his arms and legs.  He has equal strength bilaterally.  Neurologic:  No focal neurological deficits.  LABORATORY STUDIES:  White cell count is 8,  hemoglobin 14.7, hematocrit 44.3, platelet count 226.  IMPRESSION:  Mr. Hanway is a 48 year old gentleman.  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  He then received adjuvant chemo and radiation therapy with Temodar.  He then received full-dose Temodar.  He completed his treatments a year ago in January.  He is still being followed at [facility] every 3 months.  I will plan to see him back in April.  Hopefully, we will try to coordinate our appointments with [facility] so that he can get his scans before I see him.  I went ahead and refilled his Ritalin today.    ______________________________ Volanda Napoleon, M.D. PRE/MEDQ  D:  10/04/2013  T:  10/05/2013  Job:  [id number]

## 2013-10-06 ENCOUNTER — Telehealth: Payer: Self-pay | Admitting: Nurse Practitioner

## 2013-10-06 NOTE — Telephone Encounter (Addendum)
Message copied by Jimmy Footman on Wed Oct 06, 2013  1:46 PM ------      Message from: Burney Gauze R      Created: Mon Oct 04, 2013  9:58 PM       Call - labs are great!!  Pete ------LVM on pt's personal machine and instructed his to give [place] a call back with any further questions or concerns.

## 2013-12-06 ENCOUNTER — Other Ambulatory Visit: Payer: Self-pay | Admitting: Nurse Practitioner

## 2013-12-06 DIAGNOSIS — F32A Depression, unspecified: Secondary | ICD-10-CM

## 2013-12-06 DIAGNOSIS — C719 Malignant neoplasm of brain, unspecified: Secondary | ICD-10-CM

## 2013-12-06 DIAGNOSIS — F329 Major depressive disorder, single episode, unspecified: Secondary | ICD-10-CM

## 2013-12-06 MED ORDER — METHYLPHENIDATE HCL 10 MG PO TABS: 10.0000 mg | ORAL_TABLET | Freq: Two times a day (BID) | ORAL | Status: DC

## 2013-12-17 ENCOUNTER — Telehealth: Payer: Self-pay | Admitting: Hematology & Oncology

## 2013-12-17 NOTE — Telephone Encounter (Signed)
Left message moved 4-27 to 5-1

## 2013-12-24 ENCOUNTER — Other Ambulatory Visit: Payer: Self-pay | Admitting: Radiation Therapy

## 2013-12-25 ENCOUNTER — Other Ambulatory Visit: Payer: Self-pay | Admitting: Hematology & Oncology

## 2013-12-27 ENCOUNTER — Other Ambulatory Visit: Payer: Self-pay | Admitting: Lab

## 2013-12-27 ENCOUNTER — Telehealth: Payer: Self-pay | Admitting: Hematology & Oncology

## 2013-12-27 NOTE — Telephone Encounter (Signed)
Pt called needed to move lab appointments tue to thurs only at 1215 pm. I left message with 4-29 appointment

## 2014-01-03 ENCOUNTER — Ambulatory Visit: Payer: Self-pay | Admitting: Hematology & Oncology

## 2014-01-05 ENCOUNTER — Other Ambulatory Visit (HOSPITAL_BASED_OUTPATIENT_CLINIC_OR_DEPARTMENT_OTHER): Payer: BC Managed Care – PPO | Admitting: Lab

## 2014-01-05 DIAGNOSIS — R7989 Other specified abnormal findings of blood chemistry: Secondary | ICD-10-CM

## 2014-01-05 DIAGNOSIS — C719 Malignant neoplasm of brain, unspecified: Secondary | ICD-10-CM

## 2014-01-05 DIAGNOSIS — E291 Testicular hypofunction: Secondary | ICD-10-CM

## 2014-01-05 LAB — CBC WITH DIFFERENTIAL (CANCER CENTER ONLY)
BASO#: 0 10*3/uL (ref 0.0–0.2)
BASO%: 0.5 % (ref 0.0–2.0)
EOS%: 0.2 % (ref 0.0–7.0)
Eosinophils Absolute: 0 10*3/uL (ref 0.0–0.5)
HCT: 44.2 % (ref 38.7–49.9)
HGB: 15.1 g/dL (ref 13.0–17.1)
LYMPH#: 1.2 10*3/uL (ref 0.9–3.3)
LYMPH%: 18.2 % (ref 14.0–48.0)
MCH: 28.9 pg (ref 28.0–33.4)
MCHC: 34.2 g/dL (ref 32.0–35.9)
MCV: 85 fL (ref 82–98)
MONO#: 0.4 10*3/uL (ref 0.1–0.9)
MONO%: 6 % (ref 0.0–13.0)
NEUT#: 4.9 10*3/uL (ref 1.5–6.5)
NEUT%: 75.1 % (ref 40.0–80.0)
PLATELETS: 224 10*3/uL (ref 145–400)
RBC: 5.22 10*6/uL (ref 4.20–5.70)
RDW: 12.5 % (ref 11.1–15.7)
WBC: 6.5 10*3/uL (ref 4.0–10.0)

## 2014-01-05 LAB — CMP (CANCER CENTER ONLY)
ALBUMIN: 4.3 g/dL (ref 3.3–5.5)
ALK PHOS: 83 U/L (ref 26–84)
ALT(SGPT): 33 U/L (ref 10–47)
AST: 31 U/L (ref 11–38)
BUN, Bld: 14 mg/dL (ref 7–22)
CO2: 29 mEq/L (ref 18–33)
Calcium: 9.6 mg/dL (ref 8.0–10.3)
Chloride: 106 mEq/L (ref 98–108)
Creat: 1.2 mg/dl (ref 0.6–1.2)
Glucose, Bld: 138 mg/dL — ABNORMAL HIGH (ref 73–118)
POTASSIUM: 4.3 meq/L (ref 3.3–4.7)
SODIUM: 145 meq/L (ref 128–145)
TOTAL PROTEIN: 7.4 g/dL (ref 6.4–8.1)
Total Bilirubin: 0.7 mg/dl (ref 0.20–1.60)

## 2014-01-05 LAB — TESTOSTERONE: TESTOSTERONE: 265 ng/dL — AB (ref 300–890)

## 2014-01-05 LAB — LACTATE DEHYDROGENASE: LDH: 149 U/L (ref 94–250)

## 2014-01-07 ENCOUNTER — Ambulatory Visit (HOSPITAL_BASED_OUTPATIENT_CLINIC_OR_DEPARTMENT_OTHER): Payer: BC Managed Care – PPO | Admitting: Hematology & Oncology

## 2014-01-07 ENCOUNTER — Encounter: Payer: Self-pay | Admitting: Hematology & Oncology

## 2014-01-07 ENCOUNTER — Other Ambulatory Visit: Payer: Self-pay | Admitting: Nurse Practitioner

## 2014-01-07 VITALS — BP 128/82 | HR 88 | Temp 97.8°F | Resp 18 | Ht 70.0 in | Wt 223.0 lb

## 2014-01-07 DIAGNOSIS — C719 Malignant neoplasm of brain, unspecified: Secondary | ICD-10-CM

## 2014-01-07 DIAGNOSIS — R7989 Other specified abnormal findings of blood chemistry: Secondary | ICD-10-CM

## 2014-01-07 DIAGNOSIS — F329 Major depressive disorder, single episode, unspecified: Secondary | ICD-10-CM

## 2014-01-07 DIAGNOSIS — F32A Depression, unspecified: Secondary | ICD-10-CM

## 2014-01-07 DIAGNOSIS — C712 Malignant neoplasm of temporal lobe: Secondary | ICD-10-CM

## 2014-01-07 MED ORDER — METHYLPHENIDATE HCL 10 MG PO TABS: 10.0000 mg | ORAL_TABLET | Freq: Two times a day (BID) | ORAL | Status: DC

## 2014-01-07 MED ORDER — TESTOSTERONE 20.25 MG/ACT (1.62%) TD GEL: 1.0000 "application " | TRANSDERMAL | Status: DC | PRN

## 2014-01-07 NOTE — Progress Notes (Signed)
Hematology and Oncology Follow Up Visit  Nathan Prince [id number] [birth year] 48 y.o. 01/07/2014   Principle Diagnosis:   Grade 3 Oligoastrocytoma of the left temporal lobe  Current Therapy:    Observation     Interim History:  Mr.  Nathan Prince is back for followup. Last him back in January. Since has been doing okay. His weight has not gone down much. He still not exercising as much. Apparently still taking. He is getting ready to finish up for the year.  He goes out to do every couple months. He was last Advair I think earlier this month. He had some neurocognitive testing done. Am sure he had a MRI done. If there is a problem I would not about a.  He's had no problems with seizures. He's had no fever. He's had no bleeding. He's had no change in bowel or bladder habits.  He still  has not seen a family doctor. I really encouraged him to do this.  He is on Ritalin. He's taking this. He is on testosterone replacement therapy. He is not taking his like he should. I told him this might be why he's not able to lose weight. Medications: Current outpatient prescriptions:acetaminophen (TYLENOL) 650 MG CR tablet, Take 650 mg by mouth every 8 (eight) hours as needed., Disp: , Rfl: ;  calcium carbonate (CALCIUM 600) 600 MG TABS, Take by mouth. Take by mouth 2 (two) times daily., Disp: , Rfl: ;  levETIRAcetam (KEPPRA) 500 MG tablet, TAKE 1 TABLET BY MOUTH TWICE A DAY, Disp: 60 tablet, Rfl: 3 Multiple Vitamins-Minerals (MULTIVITAMINS THER. W/MINERALS) TABS, Take 1 tablet by mouth daily. , Disp: , Rfl: ;  prednisoLONE acetate (PRED FORTE) 1 % ophthalmic suspension, Place 1 drop into the right eye as needed. as needed., Disp: , Rfl: ;  tadalafil (CIALIS) 5 MG tablet, Take 1 tablet (5 mg total) by mouth prior to sexual activity and can increase to 2 tablets (10 mg) if needed., Disp: , Rfl:  methylphenidate (RITALIN) 10 MG tablet, Take 1 tablet (10 mg total) by mouth 2 (two) times daily., Disp: 60 tablet, Rfl: 0;   Testosterone (ANDROGEL PUMP) 20.25 MG/ACT (1.62%) GEL, Apply 1 application topically as needed. once daily., Disp: 75 g, Rfl: 2 No current facility-administered medications for this visit. Facility-Administered Medications Ordered in Other Visits: topical emolient (BIAFINE) emulsion, , Topical, PRN, Lora Paula, MD  Allergies: No Known Allergies  Past Medical History, Surgical history, Social history, and Family History were reviewed and updated.  Review of Systems: As above  Physical Exam:  height is 5\' 10"  (1.778 m) and weight is 223 lb (101.152 kg). His oral temperature is 97.8 F (36.6 C). His blood pressure is 128/82 and his pulse is 88. His respiration is 18.   Well-developed well-nourished white gentleman. Head and neck exam shows no ocular or oral lesions. There is no thrush. Neck is supple with no adenopathy. Lungs are clear. Cardiac exam regular rate and rhythm. No murmurs. Abdomen soft. Has good bowel sounds. There is no fluid wave. There is no palpable liver or spleen tip. Back exam no tenderness. Extremities no clubbing cyanosis or edema. Skin exam no rashes. Neurological exam no focal neurological deficits.  Lab Results  Component Value Date   WBC 6.5 01/05/2014   HGB 15.1 01/05/2014   HCT 44.2 01/05/2014   MCV 85 01/05/2014   PLT 224 01/05/2014     Chemistry      Component Value Date/Time   NA 145 01/05/2014 1223  NA 138 10/04/2013 1537   K 4.3 01/05/2014 1223   K 4.1 10/04/2013 1537   CL 106 01/05/2014 1223   CL 104 10/04/2013 1537   CO2 29 01/05/2014 1223   CO2 26 10/04/2013 1537   BUN 14 01/05/2014 1223   BUN 11 10/04/2013 1537   CREATININE 1.2 01/05/2014 1223   CREATININE 0.96 10/04/2013 1537      Component Value Date/Time   CALCIUM 9.6 01/05/2014 1223   CALCIUM 9.5 10/04/2013 1537   ALKPHOS 83 01/05/2014 1223   ALKPHOS 85 10/04/2013 1537   AST 31 01/05/2014 1223   AST 20 10/04/2013 1537   ALT 33 01/05/2014 1223   ALT 27 10/04/2013 1537   BILITOT 0.70 01/05/2014 1223    BILITOT 0.3 10/04/2013 1537         Impression and Plan: Nathan Prince is 48 year old gentleman with history of oligoastrocytoma of the left temporal lobe. He did undergo resection. He received adjuvant chemotherapy and radiation therapy. He then had a full dose chemotherapy. He completed all of this back in January of 2014.   I see no evidence of recurrent disease. Again he goes to [facility] for his evaluation MRIs. He goes in July.  I'll get him back in 3 months.  Really told him that he has to get in with his family doctor. His wife will make sure this.  Volanda Napoleon, MD 5/1/20156:06 PM

## 2014-01-10 ENCOUNTER — Telehealth: Payer: Self-pay | Admitting: Hematology & Oncology

## 2014-01-10 NOTE — Telephone Encounter (Signed)
Left message on both phones with 7-29 and 31st appointments

## 2014-02-23 ENCOUNTER — Other Ambulatory Visit: Payer: Self-pay | Admitting: *Deleted

## 2014-02-23 DIAGNOSIS — C719 Malignant neoplasm of brain, unspecified: Secondary | ICD-10-CM

## 2014-02-23 DIAGNOSIS — R7989 Other specified abnormal findings of blood chemistry: Secondary | ICD-10-CM

## 2014-02-23 DIAGNOSIS — F329 Major depressive disorder, single episode, unspecified: Secondary | ICD-10-CM

## 2014-02-23 DIAGNOSIS — F32A Depression, unspecified: Secondary | ICD-10-CM

## 2014-02-23 MED ORDER — METHYLPHENIDATE HCL 10 MG PO TABS: 10.0000 mg | ORAL_TABLET | Freq: Two times a day (BID) | ORAL | Status: DC

## 2014-03-16 ENCOUNTER — Other Ambulatory Visit: Payer: Self-pay | Admitting: Radiation Therapy

## 2014-03-16 DIAGNOSIS — C719 Malignant neoplasm of brain, unspecified: Secondary | ICD-10-CM

## 2014-03-23 ENCOUNTER — Other Ambulatory Visit: Payer: Self-pay | Admitting: Nurse Practitioner

## 2014-03-23 DIAGNOSIS — C719 Malignant neoplasm of brain, unspecified: Secondary | ICD-10-CM

## 2014-03-23 DIAGNOSIS — R7989 Other specified abnormal findings of blood chemistry: Secondary | ICD-10-CM

## 2014-03-23 DIAGNOSIS — F329 Major depressive disorder, single episode, unspecified: Secondary | ICD-10-CM

## 2014-03-23 DIAGNOSIS — F32A Depression, unspecified: Secondary | ICD-10-CM

## 2014-03-23 MED ORDER — METHYLPHENIDATE HCL 10 MG PO TABS: 10.0000 mg | ORAL_TABLET | Freq: Two times a day (BID) | ORAL | Status: DC

## 2014-03-29 ENCOUNTER — Telehealth: Payer: Self-pay | Admitting: Hematology & Oncology

## 2014-03-29 NOTE — Telephone Encounter (Signed)
Pt called to cancel appt for 7/31. Will cb to reschedule.

## 2014-03-30 ENCOUNTER — Telehealth: Payer: Self-pay | Admitting: Hematology & Oncology

## 2014-03-30 NOTE — Telephone Encounter (Signed)
Patient called and resch 04/08/14 cx apt for 04/28/14

## 2014-04-06 ENCOUNTER — Other Ambulatory Visit: Payer: Self-pay | Admitting: Lab

## 2014-04-07 ENCOUNTER — Other Ambulatory Visit: Payer: Self-pay

## 2014-04-08 ENCOUNTER — Other Ambulatory Visit: Payer: Self-pay | Admitting: Lab

## 2014-04-08 ENCOUNTER — Ambulatory Visit: Payer: Self-pay | Admitting: Hematology & Oncology

## 2014-04-10 ENCOUNTER — Encounter: Payer: Self-pay | Admitting: Radiation Oncology

## 2014-04-11 ENCOUNTER — Ambulatory Visit
Admission: RE | Admit: 2014-04-11 | Payer: BC Managed Care – PPO | Source: Ambulatory Visit | Admitting: Radiation Oncology

## 2014-04-11 ENCOUNTER — Telehealth: Payer: Self-pay | Admitting: Radiation Oncology

## 2014-04-11 NOTE — Telephone Encounter (Signed)
Sam,  I think his most recent brain scan was OK, so, we can check back in 2 months or so, unless he has questions.  MM

## 2014-04-11 NOTE — Telephone Encounter (Signed)
Patient no show for appointment. Phoned home and cell no answer. Left message on both requesting return call to reschedule.

## 2014-04-18 ENCOUNTER — Other Ambulatory Visit: Payer: Self-pay | Admitting: Hematology & Oncology

## 2014-04-26 ENCOUNTER — Other Ambulatory Visit: Payer: Self-pay | Admitting: *Deleted

## 2014-04-26 DIAGNOSIS — F32A Depression, unspecified: Secondary | ICD-10-CM

## 2014-04-26 DIAGNOSIS — R7989 Other specified abnormal findings of blood chemistry: Secondary | ICD-10-CM

## 2014-04-26 DIAGNOSIS — C719 Malignant neoplasm of brain, unspecified: Secondary | ICD-10-CM

## 2014-04-26 DIAGNOSIS — F329 Major depressive disorder, single episode, unspecified: Secondary | ICD-10-CM

## 2014-04-26 MED ORDER — METHYLPHENIDATE HCL 10 MG PO TABS: 10.0000 mg | ORAL_TABLET | Freq: Two times a day (BID) | ORAL | Status: DC

## 2014-04-28 ENCOUNTER — Other Ambulatory Visit (HOSPITAL_BASED_OUTPATIENT_CLINIC_OR_DEPARTMENT_OTHER): Payer: BC Managed Care – PPO | Admitting: Lab

## 2014-04-28 ENCOUNTER — Encounter: Payer: Self-pay | Admitting: Hematology & Oncology

## 2014-04-28 ENCOUNTER — Ambulatory Visit (HOSPITAL_BASED_OUTPATIENT_CLINIC_OR_DEPARTMENT_OTHER): Payer: BC Managed Care – PPO | Admitting: Hematology & Oncology

## 2014-04-28 VITALS — BP 119/81 | HR 85 | Temp 97.8°F | Resp 18 | Ht 70.0 in | Wt 227.0 lb

## 2014-04-28 DIAGNOSIS — F32A Depression, unspecified: Secondary | ICD-10-CM

## 2014-04-28 DIAGNOSIS — R7989 Other specified abnormal findings of blood chemistry: Secondary | ICD-10-CM

## 2014-04-28 DIAGNOSIS — F329 Major depressive disorder, single episode, unspecified: Secondary | ICD-10-CM

## 2014-04-28 DIAGNOSIS — C719 Malignant neoplasm of brain, unspecified: Secondary | ICD-10-CM

## 2014-04-28 LAB — CBC WITH DIFFERENTIAL (CANCER CENTER ONLY)
BASO#: 0 10*3/uL (ref 0.0–0.2)
BASO%: 0.4 % (ref 0.0–2.0)
EOS%: 1.5 % (ref 0.0–7.0)
Eosinophils Absolute: 0.1 10*3/uL (ref 0.0–0.5)
HCT: 41.8 % (ref 38.7–49.9)
HGB: 14.6 g/dL (ref 13.0–17.1)
LYMPH#: 1.8 10*3/uL (ref 0.9–3.3)
LYMPH%: 25 % (ref 14.0–48.0)
MCH: 29.5 pg (ref 28.0–33.4)
MCHC: 34.9 g/dL (ref 32.0–35.9)
MCV: 84 fL (ref 82–98)
MONO#: 0.6 10*3/uL (ref 0.1–0.9)
MONO%: 7.8 % (ref 0.0–13.0)
NEUT#: 4.7 10*3/uL (ref 1.5–6.5)
NEUT%: 65.3 % (ref 40.0–80.0)
PLATELETS: 219 10*3/uL (ref 145–400)
RBC: 4.95 10*6/uL (ref 4.20–5.70)
RDW: 12.9 % (ref 11.1–15.7)
WBC: 7.2 10*3/uL (ref 4.0–10.0)

## 2014-04-28 NOTE — Progress Notes (Signed)
Hematology and Oncology Follow Up Visit  Nathan Prince [id number] July 07, 1966 48 y.o. 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  Nathan Prince is back for followup. We now see him every 3 months. He finished his treatment back in April of 2014.  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  He did go to do for his regular appointment. This was back in June. He had and MRI. MRI did not show any evidence of recurrent disease in the brain.  He is trying to exercise little bit more. He is quite busy this summer. He and his family went on for couple vacations.  He's had no headache. He's had no nausea or vomiting. He is on Ritalin. This is making him feel better and more focused.  He's had no problems with bowels or bladder.  Of note, he turns 48 years old next month.  Medications: Current outpatient prescriptions:acetaminophen (TYLENOL) 650 MG CR tablet, Take 650 mg by mouth every 8 (eight) hours as needed., Disp: , Rfl: ;  calcium carbonate (CALCIUM 600) 600 MG TABS, Take by mouth. Take by mouth 2 (two) times daily., Disp: , Rfl: ;  levETIRAcetam (KEPPRA) 500 MG tablet, TAKE 1 TABLET BY MOUTH TWICE A DAY, Disp: 60 tablet, Rfl: 3 methylphenidate (RITALIN) 10 MG tablet, Take 1 tablet (10 mg total) by mouth 2 (two) times daily., Disp: 60 tablet, Rfl: 0;  Multiple Vitamins-Minerals (MULTIVITAMINS THER. W/MINERALS) TABS, Take 1 tablet by mouth daily. , Disp: , Rfl: ;  prednisoLONE acetate (PRED FORTE) 1 % ophthalmic suspension, Place 1 drop into the right eye as needed. as needed., Disp: , Rfl:  tadalafil (CIALIS) 5 MG tablet, Take 1 tablet (5 mg total) by mouth prior to sexual activity and can increase to 2 tablets (10 mg) if needed., Disp: , Rfl: ;  Testosterone (ANDROGEL PUMP) 20.25 MG/ACT (1.62%) GEL, Apply 1 application topically as needed. once daily., Disp: 75 g,  Rfl: 2 No current facility-administered medications for this visit. Facility-Administered Medications Ordered in Other Visits: topical emolient (BIAFINE) emulsion, , Topical, PRN, Lora Paula, MD  Allergies: No Known Allergies  Past Medical History, Surgical history, Social history, and Family History were reviewed and updated.  Review of Systems: As above  Physical Exam:  height is 5\' 10"  (1.778 m) and weight is 227 lb (102.967 kg). His oral temperature is 97.8 F (36.6 C). His blood pressure is 119/81 and his pulse is 85. His respiration is 18.   Well-developed and well-nourished white gentleman. Head and neck exam shows no ocular or oral lesions. He has the craniotomy scar in the lef temporal region which is well-healed. There is no adenopathy in the neck. Ocular exam shows good extra ocular muscle movement. Lungs are clear. Cardiac exam shows a regular rate and rhythm. No murmurs are noted. Abdomen is soft. Has good bowel sounds. There is no palpable liver or spleen. Extremities shows no clubbing cyanosis or edema. Neurological exam is non-focal. Skin exam shows some hyperpigmented skin lesion but none appear suspicious.  Lab Results  Component Value Date   WBC 7.2 04/28/2014   HGB 14.6 04/28/2014   HCT 41.8 04/28/2014   MCV 84 04/28/2014   PLT 219 04/28/2014     Chemistry      Component Value Date/Time   NA 145 01/05/2014 1223   NA 138 10/04/2013  1537   K 4.3 01/05/2014 1223   K 4.1 10/04/2013 1537   CL 106 01/05/2014 1223   CL 104 10/04/2013 1537   CO2 29 01/05/2014 1223   CO2 26 10/04/2013 1537   BUN 14 01/05/2014 1223   BUN 11 10/04/2013 1537   CREATININE 1.2 01/05/2014 1223   CREATININE 0.96 10/04/2013 1537      Component Value Date/Time   CALCIUM 9.6 01/05/2014 1223   CALCIUM 9.5 10/04/2013 1537   ALKPHOS 83 01/05/2014 1223   ALKPHOS 85 10/04/2013 1537   AST 31 01/05/2014 1223   AST 20 10/04/2013 1537   ALT 33 01/05/2014 1223   ALT 27 10/04/2013 1537   BILITOT 0.70 01/05/2014  1223   BILITOT 0.3 10/04/2013 1537         Impression and Plan: [name] is 48 year old male. He has a grade 3 oligoastrocytoma. He had this resected. He was on adjuvant therapy. So far, there has not been any evidence of recurrence.  We will plan to get him back in another 3 months.  He gets all of his x-ray studies done at [facility] so there is no reason for [place] to order any additional tests.  Volanda Napoleon, MD 8/20/20154:24 PM

## 2014-04-29 LAB — COMPREHENSIVE METABOLIC PANEL
ALK PHOS: 78 U/L (ref 39–117)
ALT: 39 U/L (ref 0–53)
AST: 28 U/L (ref 0–37)
Albumin: 4.5 g/dL (ref 3.5–5.2)
BUN: 15 mg/dL (ref 6–23)
CO2: 23 meq/L (ref 19–32)
Calcium: 9 mg/dL (ref 8.4–10.5)
Chloride: 106 mEq/L (ref 96–112)
Creatinine, Ser: 0.98 mg/dL (ref 0.50–1.35)
Glucose, Bld: 85 mg/dL (ref 70–99)
POTASSIUM: 4.1 meq/L (ref 3.5–5.3)
SODIUM: 139 meq/L (ref 135–145)
TOTAL PROTEIN: 6.6 g/dL (ref 6.0–8.3)
Total Bilirubin: 0.4 mg/dL (ref 0.2–1.2)

## 2014-04-29 LAB — VITAMIN D 25 HYDROXY (VIT D DEFICIENCY, FRACTURES): VIT D 25 HYDROXY: 51 ng/mL (ref 30–89)

## 2014-04-29 LAB — TESTOSTERONE: Testosterone: 384 ng/dL (ref 300–890)

## 2014-05-02 ENCOUNTER — Telehealth: Payer: Self-pay | Admitting: *Deleted

## 2014-05-02 NOTE — Telephone Encounter (Signed)
Message copied by Rico Ala on Mon May 02, 2014  2:45 PM ------      Message from: Volanda Napoleon      Created: Sun May 01, 2014  1:51 PM       Call - labs and testosterone and vit D are great!!!  Laurey Arrow ------

## 2014-05-26 ENCOUNTER — Other Ambulatory Visit: Payer: Self-pay | Admitting: *Deleted

## 2014-05-26 DIAGNOSIS — R7989 Other specified abnormal findings of blood chemistry: Secondary | ICD-10-CM

## 2014-05-26 DIAGNOSIS — C719 Malignant neoplasm of brain, unspecified: Secondary | ICD-10-CM

## 2014-05-26 DIAGNOSIS — F32A Depression, unspecified: Secondary | ICD-10-CM

## 2014-05-26 DIAGNOSIS — F329 Major depressive disorder, single episode, unspecified: Secondary | ICD-10-CM

## 2014-05-26 MED ORDER — METHYLPHENIDATE HCL 10 MG PO TABS: 10.0000 mg | ORAL_TABLET | Freq: Two times a day (BID) | ORAL | Status: DC

## 2014-06-02 ENCOUNTER — Telehealth: Payer: Self-pay | Admitting: Hematology & Oncology

## 2014-06-02 NOTE — Telephone Encounter (Signed)
APPROVED Drug: Methylphenidate (RITLAN) HCl 10MG  tablets Auth: [id number] 05/12/2014 - 06/02/2015

## 2014-07-08 ENCOUNTER — Other Ambulatory Visit: Payer: Self-pay | Admitting: Hematology & Oncology

## 2014-07-08 DIAGNOSIS — F32A Depression, unspecified: Secondary | ICD-10-CM

## 2014-07-08 DIAGNOSIS — R7989 Other specified abnormal findings of blood chemistry: Secondary | ICD-10-CM

## 2014-07-08 DIAGNOSIS — F329 Major depressive disorder, single episode, unspecified: Secondary | ICD-10-CM

## 2014-07-08 DIAGNOSIS — C719 Malignant neoplasm of brain, unspecified: Secondary | ICD-10-CM

## 2014-07-08 MED ORDER — METHYLPHENIDATE HCL 10 MG PO TABS: 10.0000 mg | ORAL_TABLET | Freq: Two times a day (BID) | ORAL | Status: DC

## 2014-07-27 ENCOUNTER — Telehealth: Payer: Self-pay | Admitting: Hematology & Oncology

## 2014-07-27 ENCOUNTER — Other Ambulatory Visit (HOSPITAL_BASED_OUTPATIENT_CLINIC_OR_DEPARTMENT_OTHER): Payer: BC Managed Care – PPO | Admitting: Lab

## 2014-07-27 DIAGNOSIS — C719 Malignant neoplasm of brain, unspecified: Secondary | ICD-10-CM

## 2014-07-27 DIAGNOSIS — Z85841 Personal history of malignant neoplasm of brain: Secondary | ICD-10-CM

## 2014-07-27 LAB — CBC WITH DIFFERENTIAL (CANCER CENTER ONLY)
BASO#: 0 10*3/uL (ref 0.0–0.2)
BASO%: 0.3 % (ref 0.0–2.0)
EOS%: 0.8 % (ref 0.0–7.0)
Eosinophils Absolute: 0.1 10*3/uL (ref 0.0–0.5)
HCT: 44.8 % (ref 38.7–49.9)
HGB: 15.3 g/dL (ref 13.0–17.1)
LYMPH#: 1.8 10*3/uL (ref 0.9–3.3)
LYMPH%: 28.1 % (ref 14.0–48.0)
MCH: 29 pg (ref 28.0–33.4)
MCHC: 34.2 g/dL (ref 32.0–35.9)
MCV: 85 fL (ref 82–98)
MONO#: 0.5 10*3/uL (ref 0.1–0.9)
MONO%: 7.3 % (ref 0.0–13.0)
NEUT%: 63.5 % (ref 40.0–80.0)
NEUTROS ABS: 4 10*3/uL (ref 1.5–6.5)
PLATELETS: 232 10*3/uL (ref 145–400)
RBC: 5.28 10*6/uL (ref 4.20–5.70)
RDW: 12.7 % (ref 11.1–15.7)
WBC: 6.3 10*3/uL (ref 4.0–10.0)

## 2014-07-27 LAB — COMPREHENSIVE METABOLIC PANEL
ALT: 35 U/L (ref 0–53)
AST: 21 U/L (ref 0–37)
Albumin: 4.6 g/dL (ref 3.5–5.2)
Alkaline Phosphatase: 84 U/L (ref 39–117)
BUN: 14 mg/dL (ref 6–23)
CALCIUM: 10.2 mg/dL (ref 8.4–10.5)
CO2: 26 meq/L (ref 19–32)
CREATININE: 1.12 mg/dL (ref 0.50–1.35)
Chloride: 104 mEq/L (ref 96–112)
Glucose, Bld: 112 mg/dL — ABNORMAL HIGH (ref 70–99)
Potassium: 4.5 mEq/L (ref 3.5–5.3)
Sodium: 140 mEq/L (ref 135–145)
Total Bilirubin: 0.5 mg/dL (ref 0.2–1.2)
Total Protein: 7 g/dL (ref 6.0–8.3)

## 2014-07-27 LAB — TESTOSTERONE: TESTOSTERONE: 385 ng/dL (ref 300–890)

## 2014-07-27 NOTE — Telephone Encounter (Signed)
Pt moved 11-19 to 12-7

## 2014-07-28 ENCOUNTER — Ambulatory Visit: Payer: Self-pay | Admitting: Hematology & Oncology

## 2014-07-28 ENCOUNTER — Telehealth: Payer: Self-pay | Admitting: *Deleted

## 2014-07-28 NOTE — Telephone Encounter (Signed)
-----   Message from Volanda Napoleon, MD sent at 07/27/2014  6:47 PM EST ----- Please call and let him know that the labs and testosterone all look great. Things. Laurey Arrow

## 2014-08-15 ENCOUNTER — Ambulatory Visit (HOSPITAL_BASED_OUTPATIENT_CLINIC_OR_DEPARTMENT_OTHER): Payer: BC Managed Care – PPO | Admitting: Hematology & Oncology

## 2014-08-15 ENCOUNTER — Encounter: Payer: Self-pay | Admitting: Hematology & Oncology

## 2014-08-15 VITALS — BP 117/90 | HR 85 | Temp 98.2°F | Resp 18 | Ht 70.0 in | Wt 227.0 lb

## 2014-08-15 DIAGNOSIS — F32A Depression, unspecified: Secondary | ICD-10-CM

## 2014-08-15 DIAGNOSIS — C719 Malignant neoplasm of brain, unspecified: Secondary | ICD-10-CM

## 2014-08-15 DIAGNOSIS — R7989 Other specified abnormal findings of blood chemistry: Secondary | ICD-10-CM

## 2014-08-15 DIAGNOSIS — Z85841 Personal history of malignant neoplasm of brain: Secondary | ICD-10-CM

## 2014-08-15 DIAGNOSIS — F329 Major depressive disorder, single episode, unspecified: Secondary | ICD-10-CM

## 2014-08-15 MED ORDER — METHYLPHENIDATE HCL 10 MG PO TABS: 10.0000 mg | ORAL_TABLET | Freq: Two times a day (BID) | ORAL | Status: DC

## 2014-08-15 NOTE — Progress Notes (Signed)
Hematology and Oncology Follow Up Visit  Nathan Prince [id number] [birth year] 49 y.o. 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  Prince is back for followup. We now see him every 3 months. He finished his treatment back in April of 2014.  He is doing well with school. He is a high school history Pharmacist, hospital. We was talk about history whenever he comes in for a visit.  He goes out to [facility] next week. He will have his MRI done.  He's had no problems with cough. He's had no shortness of breath. He's had no abdominal pain.  He's not exercising. He is doing some walking. I keeps him to try to exercise little bit more.  He did have a good Thanksgiving. He and his family will be going to [place] for Christmas.  His daughter will be going to [place] next year for a few weeks on a missionary trip.  There's been no problems with headache. He's having a problem with his speech. He's had no visual issues.  Medications: Current outpatient prescriptions: acetaminophen (TYLENOL) 650 MG CR tablet, Take 650 mg by mouth every 8 (eight) hours as needed., Disp: , Rfl: ;  ANDROGEL PUMP 20.25 MG/ACT (1.62%) GEL, APPLY 1 PUMP TOPICALLY ONCE DAILY AS NEEDED, Disp: 75 g, Rfl: 2;  calcium carbonate (CALCIUM 600) 600 MG TABS, Take by mouth. Take by mouth 2 (two) times daily., Disp: , Rfl:  levETIRAcetam (KEPPRA) 500 MG tablet, TAKE 1 TABLET BY MOUTH TWICE A DAY, Disp: 60 tablet, Rfl: 3;  methylphenidate (RITALIN) 10 MG tablet, Take 1 tablet (10 mg total) by mouth 2 (two) times daily., Disp: 60 tablet, Rfl: 0;  Multiple Vitamins-Minerals (MULTIVITAMINS THER. W/MINERALS) TABS, Take 1 tablet by mouth daily. , Disp: , Rfl:  tadalafil (CIALIS) 5 MG tablet, Take 1 tablet (5 mg total) by mouth prior to sexual activity and can increase to 2 tablets (10 mg) if needed., Disp: , Rfl: ;  prednisoLONE acetate (PRED FORTE) 1 %  ophthalmic suspension, Place 1 drop into the right eye as needed. as needed., Disp: , Rfl:  No current facility-administered medications for this visit. Facility-Administered Medications Ordered in Other Visits: topical emolient (BIAFINE) emulsion, , Topical, PRN, Lora Paula, MD  Allergies: No Known Allergies  Past Medical History, Surgical history, Social history, and Family History were reviewed and updated.  Review of Systems: As above  Physical Exam:  height is 5\' 10"  (1.778 m) and weight is 227 lb (102.967 kg). His oral temperature is 98.2 F (36.8 C). His blood pressure is 117/90 and his pulse is 85. His respiration is 18.   Well-developed and well-nourished white gentleman. Head and neck exam shows no ocular or oral lesions. He has the craniotomy scar in the lef temporal region which is well-healed. There is no adenopathy in the neck. Ocular exam shows good extra ocular muscle movement. Lungs are clear. Cardiac exam shows a regular rate and rhythm. No murmurs are noted. Abdomen is soft. Has good bowel sounds. There is no palpable liver or spleen. Extremities shows no clubbing cyanosis or edema. Neurological exam is non-focal. Skin exam shows some hyperpigmented skin lesion but none appear suspicious.  Lab Results  Component Value Date   WBC 6.3 07/27/2014   HGB 15.3 07/27/2014   HCT 44.8 07/27/2014   MCV 85 07/27/2014   PLT 232 07/27/2014  Chemistry      Component Value Date/Time   NA 140 07/27/2014 1217   NA 145 01/05/2014 1223   K 4.5 07/27/2014 1217   K 4.3 01/05/2014 1223   CL 104 07/27/2014 1217   CL 106 01/05/2014 1223   CO2 26 07/27/2014 1217   CO2 29 01/05/2014 1223   BUN 14 07/27/2014 1217   BUN 14 01/05/2014 1223   CREATININE 1.12 07/27/2014 1217   CREATININE 1.2 01/05/2014 1223      Component Value Date/Time   CALCIUM 10.2 07/27/2014 1217   CALCIUM 9.6 01/05/2014 1223   ALKPHOS 84 07/27/2014 1217   ALKPHOS 83 01/05/2014 1223   AST 21  07/27/2014 1217   AST 31 01/05/2014 1223   ALT 35 07/27/2014 1217   ALT 33 01/05/2014 1223   BILITOT 0.5 07/27/2014 1217   BILITOT 0.70 01/05/2014 1223         Impression and Plan: Nathan Prince is 48 year old male. He had a grade 3 oligoastrocytoma. He had this resected. He was on adjuvant therapy. So far, there has not been any evidence of recurrence. It now has been almost 2 years since he completed therapy. He completed treatments back in April 2014.   I did go ahead and refill his Ritalin. This has helped quite a bit. We will plan to get him back in another 3 months.  He gets all of his x-ray studies done at [facility] so there is no reason for [place] to order any additional tests.  Volanda Napoleon, MD 12/7/20155:32 PM

## 2014-08-17 ENCOUNTER — Telehealth: Payer: Self-pay | Admitting: Hematology & Oncology

## 2014-08-17 NOTE — Telephone Encounter (Signed)
Left pt message with 3-2 and 3-9 appointments on both lines

## 2014-09-12 ENCOUNTER — Other Ambulatory Visit: Payer: Self-pay | Admitting: *Deleted

## 2014-09-12 DIAGNOSIS — G9389 Other specified disorders of brain: Secondary | ICD-10-CM

## 2014-09-12 MED ORDER — LEVETIRACETAM 500 MG PO TABS: 500.0000 mg | ORAL_TABLET | Freq: Two times a day (BID) | ORAL | Status: DC

## 2014-09-13 ENCOUNTER — Other Ambulatory Visit: Payer: Self-pay | Admitting: *Deleted

## 2014-09-13 DIAGNOSIS — G9389 Other specified disorders of brain: Secondary | ICD-10-CM

## 2014-09-13 MED ORDER — LEVETIRACETAM 500 MG PO TABS
500.0000 mg | ORAL_TABLET | Freq: Two times a day (BID) | ORAL | Status: DC
Start: 2014-09-13 — End: 2015-05-29

## 2014-09-20 ENCOUNTER — Other Ambulatory Visit: Payer: Self-pay | Admitting: *Deleted

## 2014-09-20 DIAGNOSIS — R7989 Other specified abnormal findings of blood chemistry: Secondary | ICD-10-CM

## 2014-09-20 DIAGNOSIS — F329 Major depressive disorder, single episode, unspecified: Secondary | ICD-10-CM

## 2014-09-20 DIAGNOSIS — F32A Depression, unspecified: Secondary | ICD-10-CM

## 2014-09-20 DIAGNOSIS — C719 Malignant neoplasm of brain, unspecified: Secondary | ICD-10-CM

## 2014-09-20 MED ORDER — METHYLPHENIDATE HCL 10 MG PO TABS: 10.0000 mg | ORAL_TABLET | Freq: Two times a day (BID) | ORAL | Status: DC

## 2014-11-08 ENCOUNTER — Other Ambulatory Visit: Payer: Self-pay | Admitting: Nurse Practitioner

## 2014-11-08 DIAGNOSIS — R7989 Other specified abnormal findings of blood chemistry: Secondary | ICD-10-CM

## 2014-11-08 DIAGNOSIS — F32A Depression, unspecified: Secondary | ICD-10-CM

## 2014-11-08 DIAGNOSIS — F329 Major depressive disorder, single episode, unspecified: Secondary | ICD-10-CM

## 2014-11-08 DIAGNOSIS — C719 Malignant neoplasm of brain, unspecified: Secondary | ICD-10-CM

## 2014-11-08 MED ORDER — METHYLPHENIDATE HCL 10 MG PO TABS
10.0000 mg | ORAL_TABLET | Freq: Two times a day (BID) | ORAL | Status: DC
Start: 2014-11-08 — End: 2014-12-19

## 2014-11-09 ENCOUNTER — Other Ambulatory Visit (HOSPITAL_BASED_OUTPATIENT_CLINIC_OR_DEPARTMENT_OTHER): Payer: BC Managed Care – PPO | Admitting: Lab

## 2014-11-09 DIAGNOSIS — C719 Malignant neoplasm of brain, unspecified: Secondary | ICD-10-CM

## 2014-11-09 DIAGNOSIS — R7989 Other specified abnormal findings of blood chemistry: Secondary | ICD-10-CM

## 2014-11-09 DIAGNOSIS — F32A Depression, unspecified: Secondary | ICD-10-CM

## 2014-11-09 DIAGNOSIS — Z85841 Personal history of malignant neoplasm of brain: Secondary | ICD-10-CM

## 2014-11-09 DIAGNOSIS — F329 Major depressive disorder, single episode, unspecified: Secondary | ICD-10-CM

## 2014-11-09 LAB — CBC WITH DIFFERENTIAL (CANCER CENTER ONLY)
BASO#: 0 10*3/uL (ref 0.0–0.2)
BASO%: 0.7 % (ref 0.0–2.0)
EOS%: 1 % (ref 0.0–7.0)
Eosinophils Absolute: 0.1 10*3/uL (ref 0.0–0.5)
HCT: 45.2 % (ref 38.7–49.9)
HEMOGLOBIN: 15.2 g/dL (ref 13.0–17.1)
LYMPH#: 1.7 10*3/uL (ref 0.9–3.3)
LYMPH%: 27.4 % (ref 14.0–48.0)
MCH: 28.2 pg (ref 28.0–33.4)
MCHC: 33.6 g/dL (ref 32.0–35.9)
MCV: 84 fL (ref 82–98)
MONO#: 0.5 10*3/uL (ref 0.1–0.9)
MONO%: 8.3 % (ref 0.0–13.0)
NEUT#: 3.9 10*3/uL (ref 1.5–6.5)
NEUT%: 62.6 % (ref 40.0–80.0)
Platelets: 178 10*3/uL (ref 145–400)
RBC: 5.39 10*6/uL (ref 4.20–5.70)
RDW: 12.7 % (ref 11.1–15.7)
WBC: 6.1 10*3/uL (ref 4.0–10.0)

## 2014-11-10 LAB — COMPREHENSIVE METABOLIC PANEL
ALBUMIN: 4.8 g/dL (ref 3.5–5.2)
ALT: 38 U/L (ref 0–53)
AST: 29 U/L (ref 0–37)
Alkaline Phosphatase: 86 U/L (ref 39–117)
BUN: 14 mg/dL (ref 6–23)
CALCIUM: 10.5 mg/dL (ref 8.4–10.5)
CHLORIDE: 104 meq/L (ref 96–112)
CO2: 21 meq/L (ref 19–32)
CREATININE: 1.13 mg/dL (ref 0.50–1.35)
GLUCOSE: 90 mg/dL (ref 70–99)
Potassium: 4.4 mEq/L (ref 3.5–5.3)
Sodium: 139 mEq/L (ref 135–145)
Total Bilirubin: 0.5 mg/dL (ref 0.2–1.2)
Total Protein: 7.2 g/dL (ref 6.0–8.3)

## 2014-11-10 LAB — VITAMIN D 25 HYDROXY (VIT D DEFICIENCY, FRACTURES): Vit D, 25-Hydroxy: 41 ng/mL (ref 30–100)

## 2014-11-10 LAB — TESTOSTERONE: TESTOSTERONE: 817 ng/dL (ref 300–890)

## 2014-11-16 ENCOUNTER — Ambulatory Visit (HOSPITAL_BASED_OUTPATIENT_CLINIC_OR_DEPARTMENT_OTHER): Payer: BC Managed Care – PPO | Admitting: Hematology & Oncology

## 2014-11-16 ENCOUNTER — Encounter: Payer: Self-pay | Admitting: Hematology & Oncology

## 2014-11-16 VITALS — BP 113/99 | HR 87 | Temp 98.0°F | Resp 18 | Ht 70.0 in | Wt 231.0 lb

## 2014-11-16 DIAGNOSIS — R7989 Other specified abnormal findings of blood chemistry: Secondary | ICD-10-CM

## 2014-11-16 DIAGNOSIS — C719 Malignant neoplasm of brain, unspecified: Secondary | ICD-10-CM

## 2014-11-16 DIAGNOSIS — Z85841 Personal history of malignant neoplasm of brain: Secondary | ICD-10-CM

## 2014-11-16 DIAGNOSIS — F068 Other specified mental disorders due to known physiological condition: Secondary | ICD-10-CM

## 2014-11-16 NOTE — Progress Notes (Signed)
Hematology and Oncology Follow Up Visit  Nathan Prince [id number] May 24, 1966 49 y.o. 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  Nathan Prince is back for followup. We now see him every 3 months. He finished his treatment back in April of 2014.  He is doing well with school. He is a high school history Pharmacist, hospital. We always talk about history whenever he comes in for a visit.  He goes out to [facility] next month.. He will have his MRI done.  He's had no problems with cough. He's had no shortness of breath. He's had no abdominal pain.  He's not exercising. He is gaining weight. He is doing some walking.I try to encourage him to to exercise little bit more.  His daughter will be going to [place] next year for a few weeks on a missionary trip. This will be in June. He is incredibly nervous about her going over there. I told him to bring her in next time he comes to visit so that I can talk to her before she goes over to [place].  There's been no problems with headache. He's having no problem with his speech. His memory is still somewhat impaired. This, however, has gotten better.  He's had no visual issues.  Currently, his performance status is ECOG 0.  Medications:  Current outpatient prescriptions:  .  acetaminophen (TYLENOL) 650 MG CR tablet, Take 650 mg by mouth every 8 (eight) hours as needed., Disp: , Rfl:  .  ANDROGEL PUMP 20.25 MG/ACT (1.62%) GEL, APPLY 1 PUMP TOPICALLY ONCE DAILY AS NEEDED, Disp: 75 g, Rfl: 2 .  calcium carbonate (CALCIUM 600) 600 MG TABS, Take by mouth. Take by mouth 2 (two) times daily., Disp: , Rfl:  .  levETIRAcetam (KEPPRA) 500 MG tablet, Take 1 tablet (500 mg total) by mouth 2 (two) times daily., Disp: 60 tablet, Rfl: 3 .  methylphenidate (RITALIN) 10 MG tablet, Take 1 tablet (10 mg total) by mouth 2 (two) times daily., Disp: 60 tablet, Rfl: 0 .  Multiple  Vitamins-Minerals (MULTIVITAMINS THER. W/MINERALS) TABS, Take 1 tablet by mouth daily. , Disp: , Rfl:  .  tadalafil (CIALIS) 5 MG tablet, Take 1 tablet (5 mg total) by mouth prior to sexual activity and can increase to 2 tablets (10 mg) if needed., Disp: , Rfl:  .  prednisoLONE acetate (PRED FORTE) 1 % ophthalmic suspension, Place 1 drop into the right eye as needed. as needed., Disp: , Rfl:  No current facility-administered medications for this visit.  Facility-Administered Medications Ordered in Other Visits:  .  topical emolient (BIAFINE) emulsion, , Topical, PRN, Tyler Pita, MD  Allergies: No Known Allergies  Past Medical History, Surgical history, Social history, and Family History were reviewed and updated.  Review of Systems: As above  Physical Exam:  height is 5\' 10"  (1.778 m) and weight is 231 lb (104.781 kg). His oral temperature is 98 F (36.7 C). His blood pressure is 113/99 and his pulse is 87. His respiration is 18.   Well-developed and well-nourished white gentleman. Head and neck exam shows no ocular or oral lesions. He has the craniotomy scar in the lef temporal region which is well-healed. There is no adenopathy in the neck. Ocular exam shows good extra ocular muscle movement. Lungs are clear. Cardiac exam shows a regular rate and rhythm. No murmurs are noted. Abdomen is soft. Has good bowel  sounds. There is no palpable liver or spleen. Extremities shows no clubbing cyanosis or edema. Neurological exam is non-focal. Skin exam shows some hyperpigmented skin lesions but none appear suspicious.  Lab Results  Component Value Date   WBC 6.1 11/09/2014   HGB 15.2 11/09/2014   HCT 45.2 11/09/2014   MCV 84 11/09/2014   PLT 178 11/09/2014     Chemistry      Component Value Date/Time   NA 139 11/09/2014 1216   NA 145 01/05/2014 1223   K 4.4 11/09/2014 1216   K 4.3 01/05/2014 1223   CL 104 11/09/2014 1216   CL 106 01/05/2014 1223   CO2 21 11/09/2014 1216   CO2 29  01/05/2014 1223   BUN 14 11/09/2014 1216   BUN 14 01/05/2014 1223   CREATININE 1.13 11/09/2014 1216   CREATININE 1.2 01/05/2014 1223      Component Value Date/Time   CALCIUM 10.5 11/09/2014 1216   CALCIUM 9.6 01/05/2014 1223   ALKPHOS 86 11/09/2014 1216   ALKPHOS 83 01/05/2014 1223   AST 29 11/09/2014 1216   AST 31 01/05/2014 1223   ALT 38 11/09/2014 1216   ALT 33 01/05/2014 1223   BILITOT 0.5 11/09/2014 1216   BILITOT 0.70 01/05/2014 1223         Impression and Plan: Mr. Nathan Prince is 49 year old male. He had a grade 3 oligoastrocytoma. He had this resected. He was on adjuvant therapy. So far, there has not been any evidence of recurrence. It now has been almost 2 years since he completed therapy. He completed treatments back in April 2014.  I did go ahead and refill his Ritalin. This has helped quite a bit. We will plan to get him back in another 3 months.  He gets all of his x-ray studies done at [facility] so there is no reason for [place] to order any additional tests.  Volanda Napoleon, MD 3/9/20165:22 PM

## 2014-11-17 ENCOUNTER — Telehealth: Payer: Self-pay | Admitting: Hematology & Oncology

## 2014-11-17 NOTE — Telephone Encounter (Signed)
Left pt message with 6-16 appointment

## 2014-12-19 ENCOUNTER — Other Ambulatory Visit: Payer: Self-pay | Admitting: *Deleted

## 2014-12-19 DIAGNOSIS — F329 Major depressive disorder, single episode, unspecified: Secondary | ICD-10-CM

## 2014-12-19 DIAGNOSIS — R7989 Other specified abnormal findings of blood chemistry: Secondary | ICD-10-CM

## 2014-12-19 DIAGNOSIS — F32A Depression, unspecified: Secondary | ICD-10-CM

## 2014-12-19 DIAGNOSIS — C719 Malignant neoplasm of brain, unspecified: Secondary | ICD-10-CM

## 2014-12-19 MED ORDER — METHYLPHENIDATE HCL 10 MG PO TABS: 10.0000 mg | ORAL_TABLET | Freq: Two times a day (BID) | ORAL | Status: DC

## 2015-01-24 ENCOUNTER — Other Ambulatory Visit: Payer: Self-pay | Admitting: *Deleted

## 2015-01-24 DIAGNOSIS — F32A Depression, unspecified: Secondary | ICD-10-CM

## 2015-01-24 DIAGNOSIS — C719 Malignant neoplasm of brain, unspecified: Secondary | ICD-10-CM

## 2015-01-24 DIAGNOSIS — R7989 Other specified abnormal findings of blood chemistry: Secondary | ICD-10-CM

## 2015-01-24 DIAGNOSIS — F329 Major depressive disorder, single episode, unspecified: Secondary | ICD-10-CM

## 2015-01-24 MED ORDER — METHYLPHENIDATE HCL 10 MG PO TABS: 10.0000 mg | ORAL_TABLET | Freq: Two times a day (BID) | ORAL | Status: DC

## 2015-01-25 ENCOUNTER — Other Ambulatory Visit: Payer: Self-pay

## 2015-01-25 MED ORDER — TESTOSTERONE 20.25 MG/ACT (1.62%) TD GEL: TRANSDERMAL | Status: DC

## 2015-02-23 ENCOUNTER — Telehealth: Payer: Self-pay | Admitting: Hematology & Oncology

## 2015-02-23 ENCOUNTER — Ambulatory Visit: Payer: BC Managed Care – PPO | Admitting: Hematology & Oncology

## 2015-02-23 ENCOUNTER — Other Ambulatory Visit: Payer: BC Managed Care – PPO

## 2015-02-23 NOTE — Telephone Encounter (Signed)
RETURNED PT CALLED ON 6/16 REGARDING RESHCD HIS APPT FOR TODAY

## 2015-02-28 ENCOUNTER — Telehealth: Payer: Self-pay | Admitting: Hematology & Oncology

## 2015-02-28 NOTE — Telephone Encounter (Signed)
Returned pt call and resched pt for 7/19 and mailed out new calendar

## 2015-03-07 ENCOUNTER — Other Ambulatory Visit: Payer: Self-pay | Admitting: *Deleted

## 2015-03-07 DIAGNOSIS — R7989 Other specified abnormal findings of blood chemistry: Secondary | ICD-10-CM

## 2015-03-07 DIAGNOSIS — F32A Depression, unspecified: Secondary | ICD-10-CM

## 2015-03-07 DIAGNOSIS — F329 Major depressive disorder, single episode, unspecified: Secondary | ICD-10-CM

## 2015-03-07 DIAGNOSIS — C719 Malignant neoplasm of brain, unspecified: Secondary | ICD-10-CM

## 2015-03-07 MED ORDER — METHYLPHENIDATE HCL 10 MG PO TABS: 10.0000 mg | ORAL_TABLET | Freq: Two times a day (BID) | ORAL | Status: DC

## 2015-03-28 ENCOUNTER — Ambulatory Visit (HOSPITAL_BASED_OUTPATIENT_CLINIC_OR_DEPARTMENT_OTHER): Payer: BC Managed Care – PPO | Admitting: Hematology & Oncology

## 2015-03-28 ENCOUNTER — Encounter: Payer: Self-pay | Admitting: Hematology & Oncology

## 2015-03-28 ENCOUNTER — Other Ambulatory Visit (HOSPITAL_BASED_OUTPATIENT_CLINIC_OR_DEPARTMENT_OTHER): Payer: BC Managed Care – PPO

## 2015-03-28 VITALS — BP 146/88 | HR 100 | Temp 98.1°F | Resp 16

## 2015-03-28 DIAGNOSIS — R7989 Other specified abnormal findings of blood chemistry: Secondary | ICD-10-CM

## 2015-03-28 DIAGNOSIS — C719 Malignant neoplasm of brain, unspecified: Secondary | ICD-10-CM

## 2015-03-28 DIAGNOSIS — R1011 Right upper quadrant pain: Secondary | ICD-10-CM

## 2015-03-28 DIAGNOSIS — Z85841 Personal history of malignant neoplasm of brain: Secondary | ICD-10-CM

## 2015-03-28 LAB — CBC WITH DIFFERENTIAL (CANCER CENTER ONLY)
BASO#: 0 10*3/uL (ref 0.0–0.2)
BASO%: 0.3 % (ref 0.0–2.0)
EOS%: 2.3 % (ref 0.0–7.0)
Eosinophils Absolute: 0.1 10*3/uL (ref 0.0–0.5)
HEMATOCRIT: 44.3 % (ref 38.7–49.9)
HGB: 15.2 g/dL (ref 13.0–17.1)
LYMPH#: 1.5 10*3/uL (ref 0.9–3.3)
LYMPH%: 24.6 % (ref 14.0–48.0)
MCH: 29.1 pg (ref 28.0–33.4)
MCHC: 34.3 g/dL (ref 32.0–35.9)
MCV: 85 fL (ref 82–98)
MONO#: 0.5 10*3/uL (ref 0.1–0.9)
MONO%: 8.3 % (ref 0.0–13.0)
NEUT%: 64.5 % (ref 40.0–80.0)
NEUTROS ABS: 3.9 10*3/uL (ref 1.5–6.5)
Platelets: 227 10*3/uL (ref 145–400)
RBC: 5.23 10*6/uL (ref 4.20–5.70)
RDW: 13.1 % (ref 11.1–15.7)
WBC: 6.1 10*3/uL (ref 4.0–10.0)

## 2015-03-28 LAB — CMP (CANCER CENTER ONLY)
ALBUMIN: 4 g/dL (ref 3.3–5.5)
ALT: 27 U/L (ref 10–47)
AST: 27 U/L (ref 11–38)
Alkaline Phosphatase: 81 U/L (ref 26–84)
BUN, Bld: 14 mg/dL (ref 7–22)
CALCIUM: 9 mg/dL (ref 8.0–10.3)
CO2: 26 mEq/L (ref 18–33)
Chloride: 104 mEq/L (ref 98–108)
Creat: 1.1 mg/dl (ref 0.6–1.2)
GLUCOSE: 124 mg/dL — AB (ref 73–118)
Potassium: 3.9 mEq/L (ref 3.3–4.7)
SODIUM: 140 meq/L (ref 128–145)
TOTAL PROTEIN: 7.2 g/dL (ref 6.4–8.1)
Total Bilirubin: 0.8 mg/dl (ref 0.20–1.60)

## 2015-03-28 NOTE — Progress Notes (Signed)
Hematology and Oncology Follow Up Visit  Nathan Prince [id number] 04-17-1966 49 y.o. 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 We now see him every 4 months. He finished his treatment back in April of 2014.  He has been having this right upper quadrant abdominal pain. He's had this for about a month. It comes and goes. It might be a little bit worse with eating. He's had no vomiting with it. There's been no change in bowel or bladder habits. He's had no cough or shortness of breath. There's been no fever. He's had no leg swelling.  We will probably go ahead and get him set up with a ultrasound of the abdomen.  He had an MRI of the brain at [facility] back in March. Not sure when he goes back for another scan. That scan did not show any evidence of recurrence.  He feels that his memory is doing a lot better. He is taking an over-the-counter memory aid.   Currently, his performance status is ECOG 0.  Medications:  Current outpatient prescriptions:  .  acetaminophen (TYLENOL) 650 MG CR tablet, Take 650 mg by mouth every 8 (eight) hours as needed., Disp: , Rfl:  .  calcium carbonate (CALCIUM 600) 600 MG TABS, Take by mouth. Take by mouth 2 (two) times daily., Disp: , Rfl:  .  levETIRAcetam (KEPPRA) 500 MG tablet, Take 1 tablet (500 mg total) by mouth 2 (two) times daily., Disp: 60 tablet, Rfl: 3 .  methylphenidate (RITALIN) 10 MG tablet, Take 1 tablet (10 mg total) by mouth 2 (two) times daily., Disp: 60 tablet, Rfl: 0 .  Multiple Vitamins-Minerals (MULTIVITAMINS THER. W/MINERALS) TABS, Take 1 tablet by mouth daily. , Disp: , Rfl:  .  Testosterone (ANDROGEL PUMP) 20.25 MG/ACT (1.62%) GEL, APPLY 1 PUMP TOPICALLY ONCE DAILY AS NEEDED, Disp: 75 g, Rfl: 2 No current facility-administered medications for this visit.  Facility-Administered Medications Ordered in Other  Visits:  .  topical emolient (BIAFINE) emulsion, , Topical, PRN, Tyler Pita, MD  Allergies: No Known Allergies  Past Medical History, Surgical history, Social history, and Family History were reviewed and updated.  Review of Systems: As above  Physical Exam:  oral temperature is 98.1 F (36.7 C). His blood pressure is 146/88 and his pulse is 100. His respiration is 16.   Well-developed and well-nourished white gentleman. Head and neck exam shows no ocular or oral lesions. He has the craniotomy scar in the lef temporal region which is well-healed. There is no adenopathy in the neck. Ocular exam shows good extra ocular muscle movement. Lungs are clear. Cardiac exam shows a regular rate and rhythm. No murmurs are noted. Abdomen is soft. He has good bowel sounds. There might be some slight tenderness to palpation in the right upper quadrant. I cannot palpate his gallbladder. There is no palpable liver or spleen. Extremities shows no clubbing cyanosis or edema. Neurological exam is non-focal. Skin exam shows some hyperpigmented skin lesions but none appear suspicious.  Lab Results  Component Value Date   WBC 6.1 03/28/2015   HGB 15.2 03/28/2015   HCT 44.3 03/28/2015   MCV 85 03/28/2015   PLT 227 03/28/2015     Chemistry      Component Value Date/Time   NA 140 03/28/2015 0957   NA 139 11/09/2014 1216   K 3.9 03/28/2015  0957   K 4.4 11/09/2014 1216   CL 104 03/28/2015 0957   CL 104 11/09/2014 1216   CO2 26 03/28/2015 0957   CO2 21 11/09/2014 1216   BUN 14 03/28/2015 0957   BUN 14 11/09/2014 1216   CREATININE 1.1 03/28/2015 0957   CREATININE 1.13 11/09/2014 1216      Component Value Date/Time   CALCIUM 9.0 03/28/2015 0957   CALCIUM 10.5 11/09/2014 1216   ALKPHOS 81 03/28/2015 0957   ALKPHOS 86 11/09/2014 1216   AST 27 03/28/2015 0957   AST 29 11/09/2014 1216   ALT 27 03/28/2015 0957   ALT 38 11/09/2014 1216   BILITOT 0.80 03/28/2015 0957   BILITOT 0.5 11/09/2014 1216          Impression and Plan: [name] is 49 year old male. He had a grade 3 oligoastrocytoma. He had this resected. He was on adjuvant therapy. So far, there has not been any evidence of recurrence. It now has been almost 2 years since he completed therapy. He completed treatments back in April 2014.  We will go ahead and get an ultrasound of his abdomen. His like it's a gallbladder issue. If the ultrasound is negative, I'm not sure what would be next. We might have to refer him to a gastroenterologist. Volanda Napoleon, MD 7/19/201611:17 AM

## 2015-03-29 LAB — LACTATE DEHYDROGENASE: LDH: 148 U/L (ref 94–250)

## 2015-03-29 LAB — TESTOSTERONE: Testosterone: 302 ng/dL (ref 300–890)

## 2015-03-30 ENCOUNTER — Ambulatory Visit (HOSPITAL_BASED_OUTPATIENT_CLINIC_OR_DEPARTMENT_OTHER)
Admission: RE | Admit: 2015-03-30 | Discharge: 2015-03-30 | Disposition: A | Discharge status: Home / Self Care | Payer: BC Managed Care – PPO | Source: Ambulatory Visit | Attending: Hematology & Oncology | Admitting: Hematology & Oncology

## 2015-03-30 DIAGNOSIS — K7689 Other specified diseases of liver: Secondary | ICD-10-CM | POA: Insufficient documentation

## 2015-03-30 DIAGNOSIS — R1011 Right upper quadrant pain: Secondary | ICD-10-CM | POA: Diagnosis present

## 2015-03-30 DIAGNOSIS — N281 Cyst of kidney, acquired: Secondary | ICD-10-CM | POA: Insufficient documentation

## 2015-03-30 DIAGNOSIS — C719 Malignant neoplasm of brain, unspecified: Secondary | ICD-10-CM

## 2015-03-31 ENCOUNTER — Telehealth: Payer: Self-pay | Admitting: *Deleted

## 2015-03-31 NOTE — Telephone Encounter (Addendum)
Patient's wife took report.   ----- Message from Volanda Napoleon, MD sent at 03/30/2015  5:20 PM EDT ----- Call - the gallbladder looks ok!!  It is not thickened and there are no gallstones.  As such, I do not think that your gallbladder is an issue.  Liver has some cysts but these are not anything to worry about!!  Laurey Arrow

## 2015-04-18 ENCOUNTER — Other Ambulatory Visit: Payer: Self-pay | Admitting: Nurse Practitioner

## 2015-04-18 DIAGNOSIS — F329 Major depressive disorder, single episode, unspecified: Secondary | ICD-10-CM

## 2015-04-18 DIAGNOSIS — R7989 Other specified abnormal findings of blood chemistry: Secondary | ICD-10-CM

## 2015-04-18 DIAGNOSIS — F32A Depression, unspecified: Secondary | ICD-10-CM

## 2015-04-18 DIAGNOSIS — C719 Malignant neoplasm of brain, unspecified: Secondary | ICD-10-CM

## 2015-04-18 MED ORDER — METHYLPHENIDATE HCL 10 MG PO TABS: 10.0000 mg | ORAL_TABLET | Freq: Two times a day (BID) | ORAL | Status: DC

## 2015-05-29 ENCOUNTER — Other Ambulatory Visit: Payer: Self-pay | Admitting: *Deleted

## 2015-05-29 DIAGNOSIS — C719 Malignant neoplasm of brain, unspecified: Secondary | ICD-10-CM

## 2015-05-29 DIAGNOSIS — G9389 Other specified disorders of brain: Secondary | ICD-10-CM

## 2015-05-29 DIAGNOSIS — F32A Depression, unspecified: Secondary | ICD-10-CM

## 2015-05-29 DIAGNOSIS — R7989 Other specified abnormal findings of blood chemistry: Secondary | ICD-10-CM

## 2015-05-29 DIAGNOSIS — F329 Major depressive disorder, single episode, unspecified: Secondary | ICD-10-CM

## 2015-05-29 MED ORDER — LEVETIRACETAM 500 MG PO TABS: 500.0000 mg | ORAL_TABLET | Freq: Two times a day (BID) | ORAL | Status: DC

## 2015-05-29 MED ORDER — METHYLPHENIDATE HCL 10 MG PO TABS: 10.0000 mg | ORAL_TABLET | Freq: Two times a day (BID) | ORAL | Status: DC

## 2015-07-13 ENCOUNTER — Other Ambulatory Visit: Payer: Self-pay | Admitting: *Deleted

## 2015-07-13 DIAGNOSIS — R7989 Other specified abnormal findings of blood chemistry: Secondary | ICD-10-CM

## 2015-07-13 DIAGNOSIS — F32A Depression, unspecified: Secondary | ICD-10-CM

## 2015-07-13 DIAGNOSIS — C719 Malignant neoplasm of brain, unspecified: Secondary | ICD-10-CM

## 2015-07-13 DIAGNOSIS — F329 Major depressive disorder, single episode, unspecified: Secondary | ICD-10-CM

## 2015-07-13 MED ORDER — METHYLPHENIDATE HCL 10 MG PO TABS: 10.0000 mg | ORAL_TABLET | Freq: Two times a day (BID) | ORAL | Status: DC

## 2015-07-14 NOTE — Progress Notes (Signed)
Appeal letter of pt's Ritalin denial composed and faxed to [facility] with a clinical trial showing evidence of improved cognition, mood and brain function in patients with primary brain tumors. [phone]. dph

## 2015-07-24 ENCOUNTER — Other Ambulatory Visit: Payer: Self-pay

## 2015-07-24 ENCOUNTER — Ambulatory Visit: Payer: Self-pay | Admitting: Hematology & Oncology

## 2015-07-26 ENCOUNTER — Telehealth: Payer: Self-pay | Admitting: Hematology & Oncology

## 2015-07-26 NOTE — Telephone Encounter (Signed)
APPROVED Drug: Methylphenidate (RITLAN) HCl 10MG  tablets Case Appeal: [id number] Auth: 07/20/2015 - 07/19/2016     COPY SCANNED

## 2015-08-31 ENCOUNTER — Other Ambulatory Visit: Payer: Self-pay | Admitting: *Deleted

## 2015-08-31 DIAGNOSIS — F329 Major depressive disorder, single episode, unspecified: Secondary | ICD-10-CM

## 2015-08-31 DIAGNOSIS — R7989 Other specified abnormal findings of blood chemistry: Secondary | ICD-10-CM

## 2015-08-31 DIAGNOSIS — F32A Depression, unspecified: Secondary | ICD-10-CM

## 2015-08-31 DIAGNOSIS — C719 Malignant neoplasm of brain, unspecified: Secondary | ICD-10-CM

## 2015-08-31 MED ORDER — METHYLPHENIDATE HCL 10 MG PO TABS: 10.0000 mg | ORAL_TABLET | Freq: Two times a day (BID) | ORAL | Status: DC

## 2015-10-10 ENCOUNTER — Other Ambulatory Visit: Payer: Self-pay

## 2015-10-10 DIAGNOSIS — G9389 Other specified disorders of brain: Secondary | ICD-10-CM

## 2015-10-10 MED ORDER — LEVETIRACETAM 500 MG PO TABS: 500.0000 mg | ORAL_TABLET | Freq: Two times a day (BID) | ORAL | Status: DC

## 2015-10-23 ENCOUNTER — Other Ambulatory Visit: Payer: Self-pay | Admitting: *Deleted

## 2015-10-23 DIAGNOSIS — F32A Depression, unspecified: Secondary | ICD-10-CM

## 2015-10-23 DIAGNOSIS — R7989 Other specified abnormal findings of blood chemistry: Secondary | ICD-10-CM

## 2015-10-23 DIAGNOSIS — C719 Malignant neoplasm of brain, unspecified: Secondary | ICD-10-CM

## 2015-10-23 DIAGNOSIS — F329 Major depressive disorder, single episode, unspecified: Secondary | ICD-10-CM

## 2015-10-23 MED ORDER — METHYLPHENIDATE HCL 10 MG PO TABS: 10.0000 mg | ORAL_TABLET | Freq: Two times a day (BID) | ORAL | Status: DC

## 2015-10-23 MED FILL — METHYLPHENIDATE 10 MG TAB: 10 | 30 days supply | Qty: 60 | Fill #0

## 2015-12-04 ENCOUNTER — Other Ambulatory Visit: Payer: Self-pay | Admitting: *Deleted

## 2015-12-04 DIAGNOSIS — F329 Major depressive disorder, single episode, unspecified: Secondary | ICD-10-CM

## 2015-12-04 DIAGNOSIS — C719 Malignant neoplasm of brain, unspecified: Secondary | ICD-10-CM

## 2015-12-04 DIAGNOSIS — R7989 Other specified abnormal findings of blood chemistry: Secondary | ICD-10-CM

## 2015-12-04 DIAGNOSIS — F32A Depression, unspecified: Secondary | ICD-10-CM

## 2015-12-04 MED ORDER — METHYLPHENIDATE HCL 10 MG PO TABS: 10.0000 mg | ORAL_TABLET | Freq: Two times a day (BID) | ORAL | Status: DC

## 2015-12-04 MED FILL — METHYLPHENIDATE 10 MG TAB: 10 | 30 days supply | Qty: 60 | Fill #0

## 2016-01-15 ENCOUNTER — Other Ambulatory Visit: Payer: Self-pay | Admitting: *Deleted

## 2016-01-15 MED ORDER — TESTOSTERONE 20.25 MG/ACT (1.62%) TD GEL: TRANSDERMAL | Status: DC

## 2016-01-15 MED FILL — ANDROGEL 1.62% GEL PUMP: 20.25 MG/AC | 60 days supply | Qty: 75 | Fill #0

## 2016-01-16 ENCOUNTER — Telehealth: Payer: Self-pay | Admitting: *Deleted

## 2016-01-16 ENCOUNTER — Other Ambulatory Visit: Payer: Self-pay | Admitting: *Deleted

## 2016-01-16 DIAGNOSIS — F32A Depression, unspecified: Secondary | ICD-10-CM

## 2016-01-16 DIAGNOSIS — R7989 Other specified abnormal findings of blood chemistry: Secondary | ICD-10-CM

## 2016-01-16 DIAGNOSIS — C719 Malignant neoplasm of brain, unspecified: Secondary | ICD-10-CM

## 2016-01-16 DIAGNOSIS — F329 Major depressive disorder, single episode, unspecified: Secondary | ICD-10-CM

## 2016-01-16 MED ORDER — METHYLPHENIDATE HCL 10 MG PO TABS: 10.0000 mg | ORAL_TABLET | Freq: Two times a day (BID) | ORAL | Status: DC

## 2016-01-16 MED FILL — METHYLPHENIDATE 10 MG TAB: 10 | 30 days supply | Qty: 60 | Fill #0

## 2016-01-16 NOTE — Telephone Encounter (Signed)
Patient requesting Ritalin prescription refill. He has not been seen in this office since July 2016. At that time he scheduled a four month follow up appointment, which was later cancelled, but never rescheduled.   Message left on patient voice mail that this office will refill this months prescription, but he must call to schedule a follow up appointment for further refills.

## 2016-02-15 ENCOUNTER — Other Ambulatory Visit: Payer: Self-pay | Admitting: Hematology & Oncology

## 2016-04-15 ENCOUNTER — Other Ambulatory Visit: Payer: Self-pay | Admitting: Nurse Practitioner

## 2016-04-15 MED ORDER — LEVETIRACETAM 500 MG PO TABS: ORAL_TABLET | ORAL | 3 refills | Status: DC

## 2017-05-07 ENCOUNTER — Other Ambulatory Visit: Payer: Self-pay | Admitting: Hematology & Oncology

## 2018-06-08 ENCOUNTER — Other Ambulatory Visit: Payer: Self-pay | Admitting: Hematology & Oncology

## 2019-06-17 ENCOUNTER — Other Ambulatory Visit: Payer: Self-pay | Admitting: Hematology & Oncology

## 2020-07-22 ENCOUNTER — Other Ambulatory Visit: Payer: Self-pay | Admitting: Hematology & Oncology

## 2021-05-21 ENCOUNTER — Other Ambulatory Visit: Payer: Self-pay | Admitting: Hematology & Oncology

## 2022-09-22 ENCOUNTER — Emergency Department (HOSPITAL_COMMUNITY): Payer: BC Managed Care – PPO

## 2022-09-22 ENCOUNTER — Emergency Department (HOSPITAL_BASED_OUTPATIENT_CLINIC_OR_DEPARTMENT_OTHER): Payer: BC Managed Care – PPO

## 2022-09-22 ENCOUNTER — Inpatient Hospital Stay (HOSPITAL_COMMUNITY): Payer: BC Managed Care – PPO

## 2022-09-22 ENCOUNTER — Encounter (HOSPITAL_BASED_OUTPATIENT_CLINIC_OR_DEPARTMENT_OTHER): Payer: Self-pay | Admitting: Emergency Medicine

## 2022-09-22 ENCOUNTER — Inpatient Hospital Stay (HOSPITAL_BASED_OUTPATIENT_CLINIC_OR_DEPARTMENT_OTHER)
Admission: EM | Admit: 2022-09-22 | Discharge: 2022-09-26 | DRG: 101 | Disposition: A | Discharge status: Home / Self Care | Payer: BC Managed Care – PPO | Attending: Internal Medicine | Admitting: Internal Medicine

## 2022-09-22 ENCOUNTER — Other Ambulatory Visit: Payer: Self-pay

## 2022-09-22 DIAGNOSIS — R4182 Altered mental status, unspecified: Secondary | ICD-10-CM | POA: Diagnosis present

## 2022-09-22 DIAGNOSIS — R4701 Aphasia: Secondary | ICD-10-CM | POA: Diagnosis present

## 2022-09-22 DIAGNOSIS — G40909 Epilepsy, unspecified, not intractable, without status epilepticus: Principal | ICD-10-CM | POA: Diagnosis present

## 2022-09-22 DIAGNOSIS — G40919 Epilepsy, unspecified, intractable, without status epilepticus: Secondary | ICD-10-CM | POA: Diagnosis not present

## 2022-09-22 DIAGNOSIS — Z79899 Other long term (current) drug therapy: Secondary | ICD-10-CM | POA: Diagnosis not present

## 2022-09-22 DIAGNOSIS — Z85841 Personal history of malignant neoplasm of brain: Secondary | ICD-10-CM | POA: Diagnosis not present

## 2022-09-22 DIAGNOSIS — C719 Malignant neoplasm of brain, unspecified: Secondary | ICD-10-CM | POA: Diagnosis present

## 2022-09-22 DIAGNOSIS — R2990 Unspecified symptoms and signs involving the nervous system: Secondary | ICD-10-CM | POA: Diagnosis not present

## 2022-09-22 DIAGNOSIS — Z85828 Personal history of other malignant neoplasm of skin: Secondary | ICD-10-CM | POA: Diagnosis not present

## 2022-09-22 DIAGNOSIS — Z923 Personal history of irradiation: Secondary | ICD-10-CM

## 2022-09-22 DIAGNOSIS — R569 Unspecified convulsions: Secondary | ICD-10-CM | POA: Diagnosis not present

## 2022-09-22 LAB — URINALYSIS, ROUTINE W REFLEX MICROSCOPIC
Bilirubin Urine: NEGATIVE
Glucose, UA: NEGATIVE mg/dL
Hgb urine dipstick: NEGATIVE
Ketones, ur: NEGATIVE mg/dL
Leukocytes,Ua: NEGATIVE
Nitrite: NEGATIVE
Protein, ur: NEGATIVE mg/dL
Specific Gravity, Urine: 1.015 (ref 1.005–1.030)
pH: 7.5 (ref 5.0–8.0)

## 2022-09-22 LAB — COMPREHENSIVE METABOLIC PANEL
ALT: 30 U/L (ref 0–44)
AST: 27 U/L (ref 15–41)
Albumin: 4.2 g/dL (ref 3.5–5.0)
Alkaline Phosphatase: 75 U/L (ref 38–126)
Anion gap: 11 (ref 5–15)
BUN: 18 mg/dL (ref 6–20)
CO2: 24 mmol/L (ref 22–32)
Calcium: 9 mg/dL (ref 8.9–10.3)
Chloride: 96 mmol/L — ABNORMAL LOW (ref 98–111)
Creatinine, Ser: 1.07 mg/dL (ref 0.61–1.24)
GFR, Estimated: >60 mL/min (ref >60)
Glucose, Bld: 95 mg/dL (ref 70–99)
Potassium: 3.7 mmol/L (ref 3.5–5.1)
Sodium: 131 mmol/L — ABNORMAL LOW (ref 135–145)
Total Bilirubin: 0.5 mg/dL (ref 0.3–1.2)
Total Protein: 7.4 g/dL (ref 6.5–8.1)

## 2022-09-22 LAB — CBG MONITORING, ED
Glucose-Capillary: 101 mg/dL — ABNORMAL HIGH (ref 70–99)
Glucose-Capillary: 108 mg/dL — ABNORMAL HIGH (ref 70–99)

## 2022-09-22 LAB — DIFFERENTIAL
Abs Immature Granulocytes: 0.1 10*3/uL — ABNORMAL HIGH (ref 0.00–0.07)
Basophils Absolute: 0.1 10*3/uL (ref 0.0–0.1)
Basophils Relative: 0 %
Eosinophils Absolute: 0 10*3/uL (ref 0.0–0.5)
Eosinophils Relative: 0 %
Immature Granulocytes: 1 %
Lymphocytes Relative: 13 %
Lymphs Abs: 1.9 10*3/uL (ref 0.7–4.0)
Monocytes Absolute: 1.4 10*3/uL — ABNORMAL HIGH (ref 0.1–1.0)
Monocytes Relative: 9 %
Neutro Abs: 11.6 10*3/uL — ABNORMAL HIGH (ref 1.7–7.7)
Neutrophils Relative %: 77 %

## 2022-09-22 LAB — CBC
HCT: 45.7 % (ref 39.0–52.0)
Hemoglobin: 15.3 g/dL (ref 13.0–17.0)
MCH: 27.1 pg (ref 26.0–34.0)
MCHC: 33.5 g/dL (ref 30.0–36.0)
MCV: 81 fL (ref 80.0–100.0)
Platelets: 270 10*3/uL (ref 150–400)
RBC: 5.64 MIL/uL (ref 4.22–5.81)
RDW: 13.5 % (ref 11.5–15.5)
WBC: 15 10*3/uL — ABNORMAL HIGH (ref 4.0–10.5)
nRBC: 0 % (ref 0.0–0.2)

## 2022-09-22 MED ORDER — SENNOSIDES-DOCUSATE SODIUM 8.6-50 MG PO TABS: 1.0000 | ORAL_TABLET | Freq: Every evening | ORAL | Status: DC | PRN

## 2022-09-22 MED ORDER — ACETAMINOPHEN 325 MG PO TABS: 650.0000 mg | ORAL_TABLET | ORAL | Status: DC | PRN

## 2022-09-22 MED ORDER — LEVETIRACETAM IN NACL 1000 MG/100ML IV SOLN
1000.0000 mg | Freq: Once | INTRAVENOUS | Status: AC
  Filled 2022-09-22: qty 100

## 2022-09-22 MED ORDER — LORAZEPAM 2 MG/ML IJ SOLN
2.0000 mg | INTRAMUSCULAR | Status: AC
  Administered 2022-09-22: 2 mg via INTRAVENOUS
  Filled 2022-09-22: qty 1

## 2022-09-22 MED ORDER — LEVETIRACETAM IN NACL 1000 MG/100ML IV SOLN
1000.0000 mg | Freq: Once | INTRAVENOUS | Status: AC
  Administered 2022-09-22: 1000 mg via INTRAVENOUS
  Filled 2022-09-22: qty 100

## 2022-09-22 MED ORDER — ACETAMINOPHEN 650 MG RE SUPP: 650.0000 mg | RECTAL | Status: DC | PRN

## 2022-09-22 MED ORDER — LORAZEPAM 2 MG/ML IJ SOLN
2.0000 mg | Freq: Once | INTRAMUSCULAR | Status: AC
  Administered 2022-09-22: 2 mg via INTRAVENOUS
  Filled 2022-09-22: qty 1

## 2022-09-22 MED ORDER — ACETAMINOPHEN 160 MG/5ML PO SOLN: 650.0000 mg | ORAL | Status: DC | PRN

## 2022-09-22 MED ORDER — STROKE: EARLY STAGES OF RECOVERY BOOK: Freq: Once | Status: AC

## 2022-09-22 MED ORDER — SODIUM CHLORIDE 0.9 % IV SOLN
75.0000 mL/h | INTRAVENOUS | Status: DC
  Administered 2022-09-22 – 2022-09-24 (×4): 75 mL/h via INTRAVENOUS

## 2022-09-22 MED ORDER — ORAL CARE MOUTH RINSE: 15.0000 mL | OROMUCOSAL | Status: DC | PRN

## 2022-09-22 MED ORDER — SODIUM CHLORIDE 0.9 % IV SOLN: 4000.0000 mg | Freq: Once | INTRAVENOUS | Status: DC

## 2022-09-22 MED ORDER — IOHEXOL 350 MG/ML SOLN
80.0000 mL | Freq: Once | INTRAVENOUS | Status: AC | PRN
  Administered 2022-09-22: 80 mL via INTRAVENOUS

## 2022-09-22 MED ORDER — SODIUM CHLORIDE 0.9 % IV SOLN
200.0000 mg | Freq: Once | INTRAVENOUS | Status: AC
  Administered 2022-09-22: 200 mg via INTRAVENOUS
  Filled 2022-09-22: qty 20

## 2022-09-22 MED ORDER — ORAL CARE MOUTH RINSE: 15.0000 mL | OROMUCOSAL | Status: DC

## 2022-09-22 MED ORDER — SODIUM CHLORIDE 0.9 % IV SOLN
1500.0000 mg | Freq: Once | INTRAVENOUS | Status: AC
  Administered 2022-09-22: 1500 mg via INTRAVENOUS
  Filled 2022-09-22: qty 30

## 2022-09-22 MED ORDER — SODIUM CHLORIDE 0.9 % IV SOLN: Freq: Once | INTRAVENOUS | Status: AC

## 2022-09-22 MED ORDER — LEVETIRACETAM IN NACL 1500 MG/100ML IV SOLN
1500.0000 mg | Freq: Two times a day (BID) | INTRAVENOUS | Status: DC
  Administered 2022-09-22 – 2022-09-23 (×2): 1500 mg via INTRAVENOUS
  Filled 2022-09-22 (×4): qty 100

## 2022-09-22 MED ORDER — SODIUM CHLORIDE 0.9 % IV SOLN: INTRAVENOUS | Status: DC

## 2022-09-22 MED ORDER — PHENYTOIN SODIUM 50 MG/ML IJ SOLN
100.0000 mg | Freq: Three times a day (TID) | INTRAMUSCULAR | Status: DC
  Administered 2022-09-22 – 2022-09-24 (×5): 100 mg via INTRAVENOUS
  Filled 2022-09-22 (×6): qty 2

## 2022-09-22 NOTE — Progress Notes (Signed)
LTM EEG hooked up and running - no initial skin breakdown - push button tested - MRI leads used.

## 2022-09-22 NOTE — ED Notes (Signed)
Pt transported to CT

## 2022-09-22 NOTE — ED Provider Notes (Signed)
[facility] Provider Note   CSN: [id number] Arrival date & time: 09/22/22  5397     History  Chief Complaint  Patient presents with   Altered Mental Status    Nathan Prince is a 57 y.o. male.  Patient here with difficulty with speak.  History of brain mass status post surgery and radiation.  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  The last few days has had some speech issues on and off.  Last known normal was last night at 2300.  Woke up with difficulty with speech and reading.  Denies any weakness or numbness.  Denies any chest pain or shortness of breath.  History of seizures on Keppra.  No history of stroke.  The history is provided by the patient.       Home Medications Prior to Admission medications   Medication Sig Start Date End Date Taking? Authorizing Provider  acetaminophen (TYLENOL) 650 MG CR tablet Take 650 mg by mouth every 8 (eight) hours as needed.    [provider]  calcium carbonate (CALCIUM 600) 600 MG TABS Take by mouth. Take by mouth 2 (two) times daily.    [provider]  levETIRAcetam (KEPPRA) 500 MG tablet TAKE 1 TABLET BY MOUTH TWICE A DAY 05/22/21   Volanda Napoleon, MD  methylphenidate (RITALIN) 10 MG tablet Take 1 tablet (10 mg total) by mouth 2 (two) times daily. 01/16/16   Volanda Napoleon, MD  Multiple Vitamins-Minerals (MULTIVITAMINS THER. W/MINERALS) TABS Take 1 tablet by mouth daily.     [provider]  Testosterone (ANDROGEL PUMP) 20.25 MG/ACT (1.62%) GEL APPLY 1 PUMP TOPICALLY ONCE DAILY AS NEEDED 01/15/16   Volanda Napoleon, MD      Allergies    Patient has no known allergies.    Review of Systems   Review of Systems  Physical Exam Updated Vital Signs BP (!) 152/106   Pulse 74   Temp 98.2 F (36.8 C) (Oral)   Resp (!) 22   Wt 105.2 kg   SpO2 97%   BMI 33.29 kg/m  Physical Exam Vitals and nursing note reviewed.  Constitutional:       General: He is not in acute distress.    Appearance: He is well-developed. He is not ill-appearing.  HENT:     Head: Normocephalic and atraumatic.     Nose: Nose normal.     Mouth/Throat:     Mouth: Mucous membranes are moist.  Eyes:     Extraocular Movements: Extraocular movements intact.     Conjunctiva/sclera: Conjunctivae normal.     Pupils: Pupils are equal, round, and reactive to light.  Cardiovascular:     Rate and Rhythm: Normal rate and regular rhythm.     Pulses: Normal pulses.     Heart sounds: Normal heart sounds. No murmur heard. Pulmonary:     Effort: Pulmonary effort is normal. No respiratory distress.     Breath sounds: Normal breath sounds.  Abdominal:     Palpations: Abdomen is soft.     Tenderness: There is no abdominal tenderness.  Musculoskeletal:        General: No swelling.     Cervical back: Neck supple.  Skin:    General: Skin is warm and dry.     Capillary Refill: Capillary refill takes less than 2 seconds.  Neurological:     Mental Status: He is alert and oriented to person, place, and time.  Cranial Nerves: No cranial nerve deficit.     Sensory: No sensory deficit.     Motor: No weakness.     Coordination: Coordination normal.     Gait: Gait normal.     Comments: Patient has aphasia on exam but otherwise strength and sensation and visual fields appear within normal limits.  Psychiatric:        Mood and Affect: Mood normal.     ED Results / Procedures / Treatments   Labs (all labs ordered are listed, but only abnormal results are displayed) Labs Reviewed  CBC - Abnormal; Notable for the following components:      Result Value   WBC 15.0 (*)    All other components within normal limits  DIFFERENTIAL - Abnormal; Notable for the following components:   Neutro Abs 11.6 (*)    Monocytes Absolute 1.4 (*)    Abs Immature Granulocytes 0.10 (*)    All other components within normal limits  COMPREHENSIVE METABOLIC PANEL - Abnormal; Notable for  the following components:   Sodium 131 (*)    Chloride 96 (*)    All other components within normal limits  CBG MONITORING, ED - Abnormal; Notable for the following components:   Glucose-Capillary 101 (*)    All other components within normal limits  URINALYSIS, ROUTINE W REFLEX MICROSCOPIC    EKG EKG Interpretation  Date/Time:  Sunday September 22 2022 09:36:29 EST Ventricular Rate:  75 PR Interval:  171 QRS Duration: 102 QT Interval:  378 QTC Calculation: 423 R Axis:   73 Text Interpretation: Sinus rhythm Confirmed by Lennice Sites ([id number]) on 09/22/2022 9:39:27 AM  Radiology CT Head Wo Contrast  Result Date: 09/22/2022 CLINICAL DATA:  Mental status change. At 8:45 a.m. patient in having trouble speaking words and reading his phone. 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 EXAM: CT HEAD WITHOUT CONTRAST TECHNIQUE: Contiguous axial images were obtained from the base of the skull through the vertex without intravenous contrast. RADIATION DOSE REDUCTION: This exam was performed according to the departmental dose-optimization program which includes automated exposure control, adjustment of the mA and/or kV according to patient size and/or use of iterative reconstruction technique. COMPARISON:  MR head without and with contrast 09/02/2011. Report of MRI brain without and with contrast [facility] 04/09/2022 FINDINGS: Brain: Patient is status post left frontal craniotomy resection of tumor. There is diffuse loss of gray-white differentiation at the left temporal tip hypoattenuation extends into the inferior aspect of the insular cortex in the external capsule. Edema was described on the prior MRI at [facility]. The extent of the edema is unclear from the report. Subtle loss of gray-white differentiation is present in the left frontal operculum. Basal ganglia are intact. Mild subcortical white matter hypoattenuation is present in the right  hemisphere. Loss of gray-white differentiation is present at the occipital poles bilaterally. No acute hemorrhage is present. Ex vacuo dilation of the left lateral ventricle is compatible with volume loss. The brainstem and cerebellum are within normal limits. 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 Vascular: A hyperdense left middle cerebral artery present with some asymmetry into the left ICA terminus. Skull: Left frontal parietal craniotomy noted. Calvarium otherwise intact. No significant extracranial soft tissue lesion is present. Sinuses/Orbits: The paranasal sinuses and mastoid air cells are clear. Bilateral lens replacements are noted. Globes and orbits are otherwise unremarkable. IMPRESSION: 1. Status post resection of tumor in the left middle cranial fossa with surrounding edema. 2. Hyperdense left  MCA and subtle loss of gray-white differentiation in the left frontal operculum is concerning for early onset of acute left MCA infarct. 3. Subtle loss of gray-white differentiation in the left frontal operculum. 4. Loss of gray-white differentiation at the occipital poles bilaterally is concerning for additional infarcts. 5. Recommend CTA of the head and neck to assess vascular occlusion. These results were called by telephone at the time of interpretation on 09/22/2022 at 10:30 am to provider Dr. Rory Percy, who verbally acknowledged these results. Electronically Signed   By: San Morelle M.D.   On: 09/22/2022 10:30    Procedures .Critical Care  Performed by: Lennice Sites, DO Authorized by: Lennice Sites, DO   Critical care provider statement:    Critical care time (minutes):  35   Critical care was necessary to treat or prevent imminent or life-threatening deterioration of the following conditions:  CNS failure or compromise   Critical care was time spent personally by me on the following activities:  Blood draw for specimens, development of treatment plan with patient or surrogate,  discussions with consultants, evaluation of patient's response to treatment, examination of patient, obtaining history from patient or surrogate, ordering and performing treatments and interventions, ordering and review of laboratory studies, ordering and review of radiographic studies, re-evaluation of patient's condition and pulse oximetry   I assumed direction of critical care for this patient from another provider in my specialty: no       Medications Ordered in ED Medications  LORazepam (ATIVAN) injection 2 mg (has no administration in time range)  levETIRAcetam (KEPPRA) IVPB 1000 mg/100 mL premix (has no administration in time range)    Followed by  levETIRAcetam (KEPPRA) IVPB 1000 mg/100 mL premix (has no administration in time range)    Followed by  levETIRAcetam (KEPPRA) IVPB 1000 mg/100 mL premix (has no administration in time range)    Followed by  levETIRAcetam (KEPPRA) IVPB 1000 mg/100 mL premix (has no administration in time range)  0.9 %  sodium chloride infusion (has no administration in time range)  iohexol (OMNIPAQUE) 350 MG/ML injection 80 mL (80 mLs Intravenous Contrast Given 09/22/22 1036)    ED Course/ Medical Decision Making/ A&P                             Medical Decision Making Amount and/or Complexity of Data Reviewed Labs: ordered. Radiology: ordered.  Risk Prescription drug management.   Nathan Prince is here with strokelike symptoms.  Normal vitals.  No fever.  Patient with dense aphasia on exam.  Last known well is around 11:00 last night.  Code stroke enacted after talking with neurology as patient has already had a head CT per their review and radiology review that patient has a hyperdense left MCA and subtle loss of gray-white differentiation.  Concerning for early onset acute left MCA infarct.  But patient does have comorbidities of meningioma and brain tumor in the past status post resection and radiation.  He has a seizure disorder as well and is  on Keppra.  Differential is that this could be stroke or mass related issue or atypical seizure.  Patient went back to CT scan for CTA of the head and neck and overall vessels were unremarkable.  Dr. Malen Gauze with radiology is recommending ED to ED transfer to [facility] for MRI and EEG.  Will load the patient with IV Ativan and Keppra in case this is an atypical seizure.  Blood work per  my review and interpretation is otherwise unremarkable.  Dr. Plunkett/Dr. Melina Copa aware of patient being transfer ED to ED for neurology evaluation.  Patient is not a candidate for TNK or thrombectomy at this time.  This chart was dictated using voice recognition software.  Despite best efforts to proofread,  errors can occur which can change the documentation meaning.         Final Clinical Impression(s) / ED Diagnoses Final diagnoses:  Stroke-like symptoms    Rx / DC Orders ED Discharge Orders     None         Lennice Sites, DO 09/22/22 1101

## 2022-09-22 NOTE — Consult Note (Addendum)
[facility] Telemedicine Consult   Requesting Provider: Dr Ronnald Nian Consult Participants: Dr. Jerelyn Charles, Telespecialist RN Malachy Mood   Bedside RN Apolonio Schneiders Location of the provider: [facility] Location of the patient: [facility]  This consult was provided via telemedicine with 2-way video and audio communication. The patient/family was informed that care would be provided in this way and agreed to receive care in this manner.   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 He reports that he had some trouble with speech comprehension of some students on Friday but those symptoms completely resolved.  Last night he remembers watching the chiefs game and had no problems following the game as well as things on the television and phone as he was texting family and friends.  Upon awaking this morning, he was having trouble reading the words in a book and try to make sense of them.  When he talked to his wife, his speech was nonfluent and he was making a lot of mistakes in choosing words and his speech sounded at times very incomprehensible.  The symptoms of gotten worse as they have been in the emergency room. I was called by the ED provider about him and a noncontrast head CT had already been done-that I reviewed which was concerning for a dense left M1. I jumped on the camera to evaluate him, he does exhibit a lot of expressive aphasia and less of receptive aphasia. I have ordered a  stat CT angio head and neck-further in the assessment and plan below   Past Medical History:  Diagnosis Date   Astrocytoma ([place]) 09/17/2011   Depression    Low testosterone 03/30/13   Radiation 11/04/11-12/19/11   Left temporal brain 59.4 gray in 33 fractions   Skin cancer 1988    No current facility-administered medications for this encounter.  Current Outpatient Medications:    acetaminophen (TYLENOL) 650 MG CR tablet, Take 650 mg by mouth every 8 (eight) hours as needed., Disp: , Rfl:    calcium carbonate (CALCIUM 600) 600 MG TABS, Take by mouth. Take by mouth 2 (two) times daily., Disp: , Rfl:    levETIRAcetam (KEPPRA) 500 MG tablet, TAKE 1 TABLET BY MOUTH TWICE A DAY, Disp: 180 tablet, Rfl: 3   methylphenidate (RITALIN) 10 MG tablet, Take 1 tablet (10 mg total) by mouth 2 (two) times daily., Disp: 60 tablet, Rfl: 0   Multiple Vitamins-Minerals (MULTIVITAMINS THER. W/MINERALS) TABS, Take 1 tablet by mouth daily. , Disp: , Rfl:    Testosterone (ANDROGEL PUMP) 20.25 MG/ACT (1.62%) GEL, APPLY 1 PUMP TOPICALLY ONCE DAILY AS NEEDED, Disp: 75 g, Rfl: 2  Facility-Administered Medications Ordered in Other Encounters:    topical emolient (BIAFINE) emulsion, , Topical, PRN, Tyler Pita, MD, Given at 11/15/11 1401    LKW: 2000  hrs 1/13 when he went to bed. tpa given?: No, OSW IR Thrombectomy?  No emergent LVO Modified Rankin Scale: 1-No significant post stroke disability and can perform usual duties with stroke symptoms mild word finding  difficulty at baseline Time of teleneurologist evaluation: 1012  Exam: Vitals:   09/22/22 1011 09/22/22 1015  BP:  (!) 140/102  Pulse:  79  Resp:  (!) 21  Temp: 98.2 F (36.8 C)   SpO2:  97%    General: Awake alert in no distress Neurological exam Awake alert oriented to self, place and time Speech is not dysarthric but exhibits loss of fluency. He is having trouble with naming simple objects consistently.  On speaking long sentences, his  speech becomes complete word salad.  He is able to follow simple commands consistently. Cranial nerves II to XII intact Motor: No drift Sensory: No loss Coordination: No dysmetria   NIHSS 1A: Level of Consciousness - 0 1B: Ask Month and Age - 0 1C: 'Blink Eyes' & 'Squeeze Hands' - 0 2: Test Horizontal Extraocular Movements - 0 3: Test Visual Fields - 0 4: Test Facial Palsy - 0 5A: Test Left Arm Motor Drift - 0 5B: Test Right Arm Motor Drift - 0 6A: Test Left Leg Motor Drift - 0 6B: Test Right Leg Motor Drift - 0 7: Test Limb Ataxia - 0 8: Test Sensation - 0 9: Test Language/Aphasia- 2 10: Test Dysarthria - 0 11: Test Extinction/Inattention - 0 NIHSS score: 2   Imaging Reviewed: CT head without contrast with concern for hyperdense left M1.  Status post resection of the tumor in the left middle cranial fossa with surrounding edema.  Subtle loss of gray-white differentiation in the left frontal operculum.  Loss of gray-white differentiation at the occipital poles bilaterally.  Recommend CTA   CT angiography done stat-this facility does not have the ability to do perfusion studies-no emergent LVO noted on stat CT angiography.   Labs reviewed in epic and pertinent values follow: CBC    Component Value Date/Time   WBC 15.0 (H) 09/22/2022 0952   RBC 5.64 09/22/2022 0952   HGB 15.3 09/22/2022 0952   HGB 15.2 03/28/2015 0957   HCT 45.7 09/22/2022 0952   HCT 44.3 03/28/2015 0957   PLT 270 09/22/2022 0952   PLT 227 03/28/2015 0957   MCV 81.0 09/22/2022 0952   MCV 85 03/28/2015 0957   MCH 27.1 09/22/2022 0952   MCHC 33.5 09/22/2022 0952   RDW 13.5 09/22/2022 0952   RDW 13.1 03/28/2015 0957   LYMPHSABS 1.9 09/22/2022 0952   LYMPHSABS 1.5 03/28/2015 0957   MONOABS 1.4 (H) 09/22/2022 0952   EOSABS 0.0 09/22/2022 0952   EOSABS 0.1 03/28/2015 0957   BASOSABS 0.1 09/22/2022 0952   BASOSABS 0.0 03/28/2015 0957   CMP     Component Value Date/Time   NA 140 03/28/2015 0957   K  3.9 03/28/2015 0957   CL 104 03/28/2015 0957   CO2 26 03/28/2015 0957   GLUCOSE 124 (H) 03/28/2015 0957   BUN 14 03/28/2015 0957   CREATININE 1.1 03/28/2015 0957   CALCIUM 9.0 03/28/2015 0957   PROT 7.2 03/28/2015 0957   ALBUMIN 4.0 03/28/2015 0957   AST 27 03/28/2015 0957   ALT 27 03/28/2015 0957   ALKPHOS 81 03/28/2015 0957   BILITOT 0.80 03/28/2015 0957   GFRNONAA >90 09/05/2011 0402   GFRAA >90 09/05/2011 0402     Assessment: 57 year old man with a past medical history of a 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  Went to bed last night at 8 PM and was normal at that time but upon waking up and started to readable, could not make sense of the words.  Wife noticed when she spoke with him at 8:45 AM that he is having trouble with his speech.  Brought her to [facility] for emergent evaluation where a code stroke was activated. On my evaluation, he has more expressive and somewhat of receptive aphasia for complex commands. Initial head CT, shows chronic changes from the known tumor but also had a very hyperdense left M1 of the MCA. Stat CT angiography was done which surprisingly reveals patent circulation and no occlusion. Initial consideration was left MCA territory infarct but looking at the CT angiography, current presentation of aphasia might be related to underlying electrographic abnormality-seizure.  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 I would recommend emergent ER to  ER transfer for stat MRI brain with and without contrast and stat EEG, as well as possible LTM EEG if imaging is negative for further evaluation Increase home Keppra to 750 twice daily from this evening. Maintain seizure precautions I have informed my colleagues over at [facility], who would be expecting this patient and will be following on the imaging studies. I have discussed my plan in detail with Dr. Ronnald Nian at [facility] as well. I have also discussed my plan with his wife at bedside and she was wanting to relay this to the care team at [facility] that has been treating him for his tumor and I have provided my number in case anybody from the team would like to speak with me. -- Amie Portland, MD Neurologist [facility] Pager: [phone]

## 2022-09-22 NOTE — Procedures (Addendum)
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 EEG to evaluate for seizure.  Level of alertness: Awake  AEDs during EEG study: Fosphenytoin  Technical aspects: This EEG study was done with scalp electrodes positioned according to the 10-20 International system of electrode placement. Electrical activity was reviewed with band pass filter of 1-'70Hz'$ , sensitivity of 7 uV/mm, display speed of 66m/sec with a '60Hz'$  notched filter applied as appropriate. EEG data were recorded continuously and digitally stored.  Video monitoring was available and reviewed as appropriate.  Description: The posterior dominant rhythm consists of 8 Hz activity of moderate voltage (25-35 uV) seen predominantly in posterior head regions, asymmetric ( left<right) and reactive to eye opening and eye closing. EEG showed continuous 3 to 6 Hz theta-delta slowing in left hemisphere. Lateralized periodic discharges were alos noted in left hemisphwre at timex with overriding rhythmicity. At 1301, EEG also showed evolution in morphology and frequency lasting 12 seconds consistent with seizure without clinical signs twice. Hyperventilation and photic stimulation were not performed.     ABNORMALITY - Seizure without clinical signs, left hemisphere - Lateralized periodic discharges with overriding rhyhtmicity ( LPD +R) left hemisphere - background asymmetry, left<right - Continuous slow, left hemisphere  IMPRESSION: This study showed two seizures without clinical signs at 1301 lasting 12 seconds each.  Additionally there are lateralized periodic discharges with overlying rhythmicity in left hemisphere which is on the ictal-interictal continuum and high risk of seizures. There is also cortical dysfunction in left hemisphere suggestive of underlying strcutural abnormality.   Dr. KLeonel Ramsaywas notified.  Rayshawn Maney OBarbra Sarks

## 2022-09-22 NOTE — ED Notes (Signed)
Report given to [facility] at [facility]

## 2022-09-22 NOTE — Progress Notes (Signed)
Noted seizures on bedside EEG.  He has preservation of consciousness, but he does have some difficulty with speech which worsened following Ativan.  Given the preservation of consciousness, would be hesitant to aggressively pursue burst suppression or other hyper aggressive management, but I do think more aggressive antiepileptic therapy would be prudent.  I will repeat 2 mg of Ativan and loaded with fosphenytoin and continue phenytoin and Keppra.  Roland Rack, MD [facility] [phone]  If 7pm- 7am, please page neurology on call as listed in [place].

## 2022-09-22 NOTE — Progress Notes (Signed)
Received phone call @ 1011 by Dr. Rory Percy notifying me of new activation.  Code Stroke cart activated @ 1012.  NCCT already completed.  Dr. Rory Percy joined the cart as well @ 1012.  Pt. sent back to CT for CTA by Dr. Rory Percy.  Dr. Rory Percy communicated all findings with the pt. and spouse and next steps.  Pt will be transferred to [facility] for further imaging and treatment.  TSRN off Camera @ 1051.  Jarold Song BSN, Horticulturist, commercial

## 2022-09-22 NOTE — ED Triage Notes (Signed)
Pt bib [facility] [place] [facility].   LKW 2300   Around 830 had slurred speech. Pt walked to his room at [facility] but pt was unable to push/pull commands.   Hx brain cancer  Pt received 4000 mg Keppra and NS 163m/hr. Ativan  BP 137/95 HR 80 RR18 RA 94%

## 2022-09-22 NOTE — ED Notes (Signed)
EEG at bedside.

## 2022-09-22 NOTE — ED Notes (Signed)
Per EDP, send pt to [facility]

## 2022-09-22 NOTE — Progress Notes (Signed)
EEG complete - results pending. Pt needs LTM will need MRI following routine, using MRI leads for LTM

## 2022-09-22 NOTE — Progress Notes (Signed)
In regards to pt's currently ordered MRI. Pt brought to MRI for premedicated attempt. Pt prepared for exam and moved onto imaging table. Pt immediately began attempting to raise body up and roll off table. Pt continuously moving and raising arms up. Pt unsafe for MRI in current condition. Moved back to stretcher and sent back to ED via pt transport. RN made aware.

## 2022-09-22 NOTE — ED Triage Notes (Signed)
Pt arrives pov with wife, steady gait with standby assist, wife endorses at ~ 40 was having trouble speaking words. And was having trouble reading iphone. Was advised by friend to go to ED. Wife reports pt confused and "moving slow". Wife reports speech slurred. Last normal 2000 yesterday

## 2022-09-22 NOTE — ED Notes (Signed)
Pt now having trouble following commends. EDP made aware and will see pt. [facility] at bedside

## 2022-09-22 NOTE — ED Notes (Addendum)
ED Provider and [facility] at bedside.

## 2022-09-22 NOTE — Progress Notes (Signed)
S: No clinical seizure activity per wife.   O: BP (!) 141/92   Pulse 82   Temp 98.6 F (37 C) (Oral)   Resp 20   Wt 105.2 kg   SpO2 93%   BMI 33.29 kg/m   Follow up examination reveals an awake patient with expressive and receptive aphasia. Mildly decreased level of alertness. At times able to utter speech that is intelligible, such as mentioning his daughter's names after wife describes their visit today. He is dysarthric. No jerking, twitching or other clinical seizure-like activity seen.   LTM EEG reviewed at the bedside. Left sided PLEDs are noted.   A/R: 57 year old male with history of brain tumor resection and seizures who presents with acute onset of aphasia - Suspected to be an epileptic phenomenon. Stroke also possible, but felt to be significantly less likely.  - Continue LTM EEG - Given the preservation of consciousness, we would be hesitant to aggressively pursue burst suppression or other hyper aggressive management, but we do think that aggressive antiepileptic therapy would be prudent.  - Continue phenytoin, Keppra and Vimpat.  - Frequent neuro checks - Inpatient seizure precautions.   Electronically signed: Dr. Kerney Elbe

## 2022-09-22 NOTE — H&P (Signed)
History and Physical    Patient: Nathan Prince YPP:[id number] DOB: 1966/03/13 DOA: 09/22/2022 DOS: the patient was seen and examined on 09/22/2022 PCP: Nathan Melter, MD  Patient coming from: Home - lives with wife; NOK: Wife, Nathan Prince, [phone]   Chief Complaint: stroke-like symptoms  HPI: Nathan Prince is a 57 y.o. 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 He has a h/o atypical meningioma and astrocytoma that was treated with radiation in 2013 and again in 2022.  He had been clinically and radiographically stable and was last seen in 04/2022.  Last night, he told his wife that he was having some difficulty with reading.  He went to bed early and appeared to be ok this AM.  They got ready and were going to church when he began having word finding difficulties.  They got to church and he had trouble reading a text.  He was brought to [facility] and continued to have both expressive and receptive aphasia.  He also began having trouble following commands at about 1130.  He was transferred to [facility] for further evaluation.    ER Course:  Seen at [facility].  H/o brain tumor with resection and radiation.  He has had a couple of days of neurologic symptoms.  Last night at 8pm was last at baseline.  This AM, unable to speak or read.  On Keppra for seizures.  CT with ?early infarct, CTA negative.  Concern for seizures.  Dr. Leonel Prince has seen.  Loaded with fosphenytoin, given Ativan.     Review of Systems: unable to review all systems due to the inability of the patient to answer questions. Past Medical History:  Diagnosis Date   Astrocytoma ([place]) 09/17/2011   subsequent oligodendroglioma in 2022   Depression    Low testosterone 03/30/2013   Radiation 11/04/11-12/19/11   Left temporal brain 59.4 gray in 33 fractions   Skin cancer 09/09/1986   Past Surgical History:  Procedure Laterality Date   BACK  SURGERY  1996   ruptured disc   CRANIOTOMY  09/04/2011   Procedure: CRANIOTOMY TUMOR EXCISION;  Surgeon: Floyce Stakes;  Location: [place] [facility];  Service: Neurosurgery;  Laterality: Left;  Left Temporal craniectomy for tumor   MASS BIOPSY  09/05/2011   left temporal lobe   Social History:  reports that he has never smoked. He has never used smokeless tobacco. He reports that he does not drink alcohol and does not use drugs.  No Known Allergies  History reviewed. No pertinent family history.  Prior to Admission medications   Medication Sig Start Date End Date Taking? Authorizing Provider  acetaminophen (TYLENOL) 650 MG CR tablet Take 650 mg by mouth every 8 (eight) hours as needed.    [provider]  calcium carbonate (CALCIUM 600) 600 MG TABS Take by mouth. Take by mouth 2 (two) times daily.    [provider]  levETIRAcetam (KEPPRA) 500 MG tablet TAKE 1 TABLET BY MOUTH TWICE A DAY 05/22/21   Volanda Napoleon, MD  methylphenidate (RITALIN) 10 MG tablet Take 1 tablet (10 mg total) by mouth 2 (two) times daily. 01/16/16   Volanda Napoleon, MD  Multiple Vitamins-Minerals (MULTIVITAMINS THER. W/MINERALS) TABS Take 1 tablet by mouth daily.     [provider]  Testosterone (ANDROGEL PUMP) 20.25 MG/ACT (1.62%) GEL APPLY 1 PUMP TOPICALLY ONCE DAILY AS NEEDED 01/15/16   Volanda Napoleon, MD    Physical Exam: Vitals:  09/22/22 1300 09/22/22 1330 09/22/22 1615 09/22/22 1700  BP: (!) 149/88 (!) 150/97    Pulse: 70 77 88 90  Resp: (!) 21 14 (!) 24 20  Temp:      TempSrc:      SpO2: 96% 98% 96% 96%  Weight:       General:  Appears confused, picking at lines/tubes; EEG leads being applied throughout visit Eyes:   EOMI, normal lids, iris ENT:   hard of hearing particularly in left ear, grossly normal lips & tongue, mmm; appropriate dentition Neck:  no LAD, masses or thyromegaly Cardiovascular:  RRR, no m/r/g. No LE edema.  Respiratory:   CTA bilaterally with no  wheezes/rales/rhonchi.  Normal respiratory effort. Abdomen:  soft, NT, ND Skin:  no rash or induration seen on limited exam Musculoskeletal:  grossly normal tone BUE/BLE, good ROM, no bony abnormality, followed commands well Psychiatric: confused/agitated mood and affect, speech with both expressive and receptive aphasia, oriented to person, unsure which hospital (makes sense given transport), and told me the month and tried to say the date but not the year despite being specifically asked about the year Neurologic:  CN 2-12 grossly intact, moves all extremities in coordinated fashion, now able to follow commands   Radiological Exams on Admission: Independently reviewed - see discussion in A/P where applicable  EEG adult  Result Date: 09/22/2022 Lora Havens, MD     09/22/2022  2:44 PM Patient Name: Shiro Prince MRN: [id number] Epilepsy Attending: Lora Havens Referring Physician/Provider: Lennice Sites, DO Date: 09/22/2022 Duration: 21.57 mins Patient history:  57 year old man with a past medical history of 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 EEG to evaluate for seizure. Level of alertness: Awake AEDs during EEG study: Fosphenytoin Technical aspects: This EEG study was done with scalp electrodes positioned according to the 10-20 International system of electrode placement. Electrical activity was reviewed with band pass filter of 1-'70Hz'$ , sensitivity of 7 uV/mm, display speed of 74m/sec with a '60Hz'$  notched filter applied as appropriate. EEG data were recorded continuously and digitally stored.  Video monitoring was available and reviewed as appropriate. Description: The posterior dominant rhythm consists of 8 Hz activity of moderate voltage (25-35 uV) seen predominantly in posterior head  regions, asymmetric ( left<right) and reactive to eye opening and eye closing. EEG showed continuous 3 to 6 Hz theta-delta slowing in left hemisphere at timex with overriding rhythmicity. At 1301, EEG also showed evolution in morphology and frequency lasting 12 seconds consistent with seizure without clinical signs twice. Hyperventilation and photic stimulation were not performed.   ABNORMALITY - Seizure without clinical signs, left hemisphere - Lateralized periodic discharges with overriding rhyhtmicity ( LPD +R) left hemisphere - background asymmetry, left<right - Continuous slow, left hemisphere IMPRESSION: This study showed two seizures without clinical signs at 1301 lasting 12 seconds each. Additionally there are lateralized periodic discharges with overlying rhythmicity in left hemisphere which is on the ictal-interictal continuum and high risk of seizures. There is also cortical dysfunction in left hemisphere suggestive of underlying strcutural abnormality. Dr. KLeonel Ramsaywas notified. Priyanka OBarbra Sarks  CT ANGIO HEAD NECK W WO CM (CODE STROKE)  Result Date: 09/22/2022 CLINICAL DATA:  New onset aphasia and unsteady gait. Abnormal CT of the head. EXAM: CT ANGIOGRAPHY HEAD AND NECK TECHNIQUE: Multidetector CT imaging of the  head and neck was performed using the standard protocol during bolus administration of intravenous contrast. Multiplanar CT image reconstructions and MIPs were obtained to evaluate the vascular anatomy. Carotid stenosis measurements (when applicable) are obtained utilizing NASCET criteria, using the distal internal carotid diameter as the denominator. RADIATION DOSE REDUCTION: This exam was performed according to the departmental dose-optimization program which includes automated exposure control, adjustment of the mA and/or kV according to patient size and/or use of iterative reconstruction technique. CONTRAST:  4m OMNIPAQUE IOHEXOL 350 MG/ML SOLN COMPARISON:  CT head without  contrast 09/22/2022. FINDINGS: CTA NECK FINDINGS Aortic arch: A 3 vessel arch configuration is present. No significant atherosclerotic change or stenosis is present. Right carotid system: The right common carotid artery is within normal limits. The bifurcation is normal. The cervical right ICA is within normal limits. Left carotid system: The left common carotid artery is within normal limits. The bifurcation is unremarkable. The cervical left ICA is normal. Vertebral arteries: The vertebral arteries are codominant. Both vertebral arteries originate from the subclavian arteries. No significant stenosis is present in either vertebral artery in the neck. Skeleton: Mild degenerative changes of the cervical spine are most pronounced at C3-4. No focal osseous lesions are present. Other neck: Soft tissues the neck are otherwise unremarkable. Salivary glands are within normal limits. Thyroid is normal. No significant adenopathy is present. No focal mucosal or submucosal lesions are present. Upper chest: The lung apices are clear. The visualized upper abdomen is unremarkable. Review of the MIP images confirms the above findings CTA HEAD FINDINGS Anterior circulation: The internal carotid arteries are within normal limits from the skull base through the ICA termini. The A1 and M1 segments are normal. The anterior communicating artery is patent. The MCA bifurcations are within normal limits. The right MCA branch vessels and bilateral ACA branch vessels are normal. The left MCA branch vessels are decreased caliber compared to the right. No significant proximal stenosis or occlusion is present. Increased collateral vessels are present on the left. This may be secondary to treatment for the patient's known tumor. No aneurysm is present. Posterior circulation: The vertebral arteries are codominant. PICA origins are visualized and normal. The vertebrobasilar junction and basilar artery are normal. Both posterior cerebral arteries  originate from basilar tip. The PCA branch vessels are within normal limits bilaterally. Venous sinuses: The dural sinuses are patent. The straight sinus and deep cerebral veins are intact. Cortical veins are within normal limits. No significant vascular malformation is evident. Anatomic variants: None Review of the MIP images confirms the above findings IMPRESSION: 1. Decreased caliber of the left MCA branch vessels compared to the right. No significant proximal stenosis or occlusion is present. This is likely secondary to treatment for the patient's left-sided tumor. Increased collateral vessels are noted. 2. Normal appearance of the left M1 segment. No significant proximal stenosis, aneurysm or branch vessel occlusion within the Circle of Willis. 3. Normal CTA of the neck. These results were called by telephone at the time of interpretation on 09/22/2022 at 10:50 am to [facility], who verbally acknowledged these results. Electronically Signed   By: CSan MorelleM.D.   On: 09/22/2022 11:01   CT Head Wo Contrast  Result Date: 09/22/2022 CLINICAL DATA:  Mental status change. At 8:45 a.m. patient in having trouble speaking words and reading his phone. 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 EXAM: CT HEAD WITHOUT CONTRAST TECHNIQUE: Contiguous axial images were obtained from the base of the  skull through the vertex without intravenous contrast. RADIATION DOSE REDUCTION: This exam was performed according to the departmental dose-optimization program which includes automated exposure control, adjustment of the mA and/or kV according to patient size and/or use of iterative reconstruction technique. COMPARISON:  MR head without and with contrast 09/02/2011. Report of MRI brain without and with contrast [facility] 04/09/2022 FINDINGS: Brain: Patient is status post left frontal craniotomy resection of tumor. There is diffuse loss of  gray-white differentiation at the left temporal tip hypoattenuation extends into the inferior aspect of the insular cortex in the external capsule. Edema was described on the prior MRI at [facility]. The extent of the edema is unclear from the report. Subtle loss of gray-white differentiation is present in the left frontal operculum. Basal ganglia are intact. Mild subcortical white matter hypoattenuation is present in the right hemisphere. Loss of gray-white differentiation is present at the occipital poles bilaterally. No acute hemorrhage is present. Ex vacuo dilation of the left lateral ventricle is compatible with volume loss. The brainstem and cerebellum are within normal limits. 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 Vascular: A hyperdense left middle cerebral artery present with some asymmetry into the left ICA terminus. Skull: Left frontal parietal craniotomy noted. Calvarium otherwise intact. No significant extracranial soft tissue lesion is present. Sinuses/Orbits: The paranasal sinuses and mastoid air cells are clear. Bilateral lens replacements are noted. Globes and orbits are otherwise unremarkable. IMPRESSION: 1. Status post resection of tumor in the left middle cranial fossa with surrounding edema. 2. 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 3. Subtle loss of gray-white differentiation in the left frontal operculum. 4. Loss of gray-white differentiation at the occipital poles bilaterally is concerning for additional infarcts. 5. Recommend CTA of the head and neck to assess vascular occlusion. These results were called by telephone at the time of interpretation on 09/22/2022 at 10:30 am to provider Dr. Rory Percy, who verbally acknowledged these results. Electronically Signed   By: San Morelle M.D.   On: 09/22/2022 10:30    EKG: Independently reviewed.  NSR with rate 75; no evidence  of acute ischemia   Labs on Admission: I have personally reviewed the available labs and imaging studies at the time of the admission.  Pertinent labs:    Na++ 131 WBC 15 Normal UA   Assessment and Plan: Principal Problem:   Stroke-like symptoms Active Problems:   Astrocytoma (HCC)   Seizure (HCC)    Stroke-like symptoms with h/o brain tumors due to seizures -Patient with prior h/o brain tumors presenting with expressive and receptive aphasia, AMS -Initial concern for CVA due to CT but seizures currently appear to be more likely -EEG abnormal -Underlying malignancy is also a consideration, needs MRI but currently unable to tolerate -Patient will be admitted to progressive care for further evaluation -Neurology has seen the patient -Patient loaded with fosphenytoin and then transitioned to Keppra, phenytoin -He also will need driving restriction for at least 6 months -Seizure precautions -Ativan prn  -Neuro-oncology care is at [facility], will need outpatient neuro f/u there assuming he stabilizes  -PT/OT/ST, TOC team consults     Advance Care Planning:   Code Status: Full Code   Consults: Neurology; PT/OT/ST; TOC team  DVT Prophylaxis: SCDs  Family Communication: Wife was present throughout evaluation  Severity of Illness: The appropriate patient status for this patient is INPATIENT. Inpatient status is judged to be  reasonable and necessary in order to provide the required intensity of service to ensure the patient's safety. The patient's presenting symptoms, physical exam findings, and initial radiographic and laboratory data in the context of their chronic comorbidities is felt to place them at high risk for further clinical deterioration. Furthermore, it is not anticipated that the patient will be medically stable for discharge from the hospital within 2 midnights of admission.   * I certify that at the point of admission it is my clinical judgment that the patient will  require inpatient hospital care spanning beyond 2 midnights from the point of admission due to high intensity of service, high risk for further deterioration and high frequency of surveillance required.*  Author: Karmen Bongo, MD 09/22/2022 7:06 PM  For on call review [email or web address].

## 2022-09-22 NOTE — ED Notes (Signed)
Patient transported to CT 

## 2022-09-22 NOTE — ED Notes (Signed)
ED Provider at bedside. 

## 2022-09-23 ENCOUNTER — Inpatient Hospital Stay (HOSPITAL_COMMUNITY): Payer: BC Managed Care – PPO

## 2022-09-23 DIAGNOSIS — R2990 Unspecified symptoms and signs involving the nervous system: Secondary | ICD-10-CM

## 2022-09-23 DIAGNOSIS — G40919 Epilepsy, unspecified, intractable, without status epilepticus: Secondary | ICD-10-CM

## 2022-09-23 DIAGNOSIS — R569 Unspecified convulsions: Secondary | ICD-10-CM

## 2022-09-23 LAB — RAPID URINE DRUG SCREEN, HOSP PERFORMED
Amphetamines: NOT DETECTED
Barbiturates: NOT DETECTED
Benzodiazepines: NOT DETECTED
Cocaine: NOT DETECTED
Opiates: NOT DETECTED
Tetrahydrocannabinol: NOT DETECTED

## 2022-09-23 LAB — LIPID PANEL
Cholesterol: 139 mg/dL (ref 0–200)
HDL: 35 mg/dL — ABNORMAL LOW (ref >40)
LDL Cholesterol: 80 mg/dL (ref 0–99)
Total CHOL/HDL Ratio: 4 RATIO
Triglycerides: 120 mg/dL (ref <150)
VLDL: 24 mg/dL (ref 0–40)

## 2022-09-23 LAB — HEMOGLOBIN A1C
Hgb A1c MFr Bld: 5.5 % (ref 4.8–5.6)
Mean Plasma Glucose: 111.15 mg/dL

## 2022-09-23 LAB — PHENYTOIN LEVEL, TOTAL: Phenytoin Lvl: 14.2 ug/mL (ref 10.0–20.0)

## 2022-09-23 LAB — HIV ANTIBODY (ROUTINE TESTING W REFLEX): HIV Screen 4th Generation wRfx: NONREACTIVE

## 2022-09-23 MED ORDER — MIDAZOLAM HCL 2 MG/2ML IJ SOLN: 2.0000 mg | INTRAMUSCULAR | Status: DC | PRN

## 2022-09-23 MED ORDER — ORAL CARE MOUTH RINSE: 15.0000 mL | OROMUCOSAL | Status: AC | PRN

## 2022-09-23 MED ORDER — GADOBUTROL 1 MMOL/ML IV SOLN
10.0000 mL | Freq: Once | INTRAVENOUS | Status: AC | PRN
  Administered 2022-09-23: 10 mL via INTRAVENOUS

## 2022-09-23 MED ORDER — SODIUM CHLORIDE 0.9 % IV SOLN
2000.0000 mg | Freq: Two times a day (BID) | INTRAVENOUS | Status: DC
  Administered 2022-09-23: 2000 mg via INTRAVENOUS
  Filled 2022-09-23 (×3): qty 20

## 2022-09-23 NOTE — Progress Notes (Signed)
PROGRESS NOTE    Nathan Prince  YOV:[id number] DOB: 08-Apr-1966 DOA: 09/22/2022 PCP: Orpah Melter, MD   Brief Narrative:  Nathan Prince is a 57 y.o. 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 He has a h/o atypical meningioma and astrocytoma that was treated with radiation in 2013 and again in 2022.  He had been clinically and radiographically stable and was last seen in 04/2022. 24h prior to admission noted to have word finding difficulties/difficulty reading with subsequent receptive/expressive aphasia and altered mental status/confusion/combative nature. [facility] called for admission, Neuro called in consult  Assessment & Plan:   Principal Problem:   Stroke-like symptoms Active Problems:   Astrocytoma (HCC)   Seizure ([place])  Acute metabolic encephalopathy Abnormal speech pattern Rule out seizure Less likely acut stroke Chronic history of brain masses s/p radiation/surgery -Initial concern for CVA due to CT but seizures currently appear to be more likely -EEG abnormal -Underlying malignancy is also a consideration, MRI pending -Neurology consulting - appreciate insight/recs -Patient loaded with fosphenytoin and then transitioned to [place], phenytoin -Primary neuro-oncology care is at [facility], will need outpatient neuro f/u there assuming he stabilizes  -PT/OT/ST, TOC teams following  DVT prophylaxis: Holding, low risk - early ambulation/OOB as tolerated Code Status: Full Family Communication: At bedside  Status is: Inpt  Dispo: The patient is from: Home              Anticipated d/c is to: Home              Anticipated d/c date is: 24-48h pending neuro clearance              Patient currently NOT medically stable for discharge  Consultants:  Neuro  Procedures:  None  Antimicrobials:  None   Subjective: No acute issues/events overnight - more awake/alert/oriented this  morning.  Objective: Vitals:   09/23/22 0400 09/23/22 0500 09/23/22 0509 09/23/22 0600  BP: 108/67 130/82  (!) 126/90  Pulse: 81 77  79  Resp: 20 (!) 21  (!) 24  Temp:   98 F (36.7 C)   TempSrc:   Oral   SpO2: 93% 95%  98%  Weight:        Intake/Output Summary (Last 24 hours) at 09/23/2022 0827 Last data filed at 09/23/2022 0003 Gross per 24 hour  Intake 281.04 ml  Output 600 ml  Net -318.96 ml   Filed Weights   09/22/22 0938  Weight: 105.2 kg    Examination:  General exam: Appears calm and comfortable  Respiratory system: Clear to auscultation. Respiratory effort normal. Cardiovascular system: S1 & S2 heard, RRR. No JVD, murmurs, rubs, gallops or clicks. No pedal edema. Gastrointestinal system: Abdomen is nondistended, soft and nontender. No organomegaly or masses felt. Normal bowel sounds heard. Central nervous system: Alert and oriented. No focal neurological deficits. Extremities: Symmetric 5 x 5 power.  Data Reviewed: I have personally reviewed following labs and imaging studies  CBC: Recent Labs  Lab 09/22/22 0952  WBC 15.0*  NEUTROABS 11.6*  HGB 15.3  HCT 45.7  MCV 81.0  PLT 741   Basic Metabolic Panel: Recent Labs  Lab 09/22/22 0952  NA 131*  K 3.7  CL 96*  CO2 24  GLUCOSE 95  BUN 18  CREATININE 1.07  CALCIUM 9.0   GFR: CrCl cannot be calculated (Unknown ideal weight.). Liver Function Tests: Recent Labs  Lab 09/22/22 0952  AST 27  ALT 30  ALKPHOS 75  BILITOT 0.5  PROT 7.4  ALBUMIN 4.2   No results for input(s): "LIPASE", "AMYLASE" in the last 168 hours. No results for input(s): "AMMONIA" in the last 168 hours. Coagulation Profile: No results for input(s): "INR", "PROTIME" in the last 168 hours. Cardiac Enzymes: No results for input(s): "CKTOTAL", "CKMB", "CKMBINDEX", "TROPONINI" in the last 168 hours. BNP (last 3 results) No results for input(s): "PROBNP" in the last 8760 hours. HbA1C: Recent Labs    09/23/22 0403  HGBA1C  5.5   CBG: Recent Labs  Lab 09/29/2022 0939 2022-09-29 1200  GLUCAP 101* 108*   Lipid Profile: Recent Labs    09/23/22 0403  CHOL 139  HDL 35*  LDLCALC 80  TRIG 120  CHOLHDL 4.0   Thyroid Function Tests: No results for input(s): "TSH", "T4TOTAL", "FREET4", "T3FREE", "THYROIDAB" in the last 72 hours. Anemia Panel: No results for input(s): "VITAMINB12", "FOLATE", "FERRITIN", "TIBC", "IRON", "RETICCTPCT" in the last 72 hours. Sepsis Labs: No results for input(s): "PROCALCITON", "LATICACIDVEN" in the last 168 hours.  No results found for this or any previous visit (from the past 240 hour(s)).       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 EEG to evaluate for seizure. Level of alertness: Awake AEDs during EEG study: Fosphenytoin Technical aspects: This EEG study was done with scalp electrodes positioned according to the 10-20 International system of electrode placement. Electrical activity was reviewed with band pass filter of 1-'70Hz'$ , sensitivity of 7 uV/mm, display speed of 84m/sec with a '60Hz'$  notched filter applied as appropriate. EEG data were recorded continuously and digitally stored.  Video monitoring was available and reviewed as appropriate. Description: The posterior dominant rhythm consists of 8 Hz activity of moderate voltage (25-35 uV) seen predominantly in posterior head regions, asymmetric ( left<right) and reactive to eye opening and eye closing. EEG  showed continuous 3 to 6 Hz theta-delta slowing in left hemisphere at timex with overriding rhythmicity. At 1301, EEG also showed evolution in morphology and frequency lasting 12 seconds consistent with seizure without clinical signs twice. Hyperventilation and photic stimulation were not performed.   ABNORMALITY - Seizure without clinical signs, left hemisphere - Lateralized periodic discharges with overriding rhyhtmicity ( LPD +R) left hemisphere - background asymmetry, left<right - Continuous slow, left hemisphere IMPRESSION: This study showed two seizures without clinical signs at 1301 lasting 12 seconds each. Additionally there are lateralized periodic discharges with overlying rhythmicity in left hemisphere which is on the ictal-interictal continuum and high risk of seizures. There is also cortical dysfunction in left hemisphere suggestive of underlying strcutural abnormality. Dr. KLeonel Ramsaywas notified. Priyanka OBarbra Sarks  CT ANGIO HEAD NECK W WO CM (CODE STROKE)  Result Date: 101-21-24CLINICAL DATA:  New onset aphasia and unsteady gait. Abnormal CT of the head. EXAM: CT ANGIOGRAPHY HEAD AND NECK TECHNIQUE: Multidetector CT imaging of the head and neck was performed using the standard protocol during bolus administration of intravenous contrast. Multiplanar CT image reconstructions and MIPs were obtained to evaluate the vascular anatomy. Carotid stenosis measurements (when applicable) are  obtained utilizing NASCET criteria, using the distal internal carotid diameter as the denominator. RADIATION DOSE REDUCTION: This exam was performed according to the departmental dose-optimization program which includes automated exposure control, adjustment of the mA and/or kV according to patient size and/or use of iterative reconstruction technique. CONTRAST:  34m OMNIPAQUE IOHEXOL 350 MG/ML SOLN COMPARISON:  CT head without contrast 09/22/2022. FINDINGS: CTA NECK FINDINGS Aortic arch: A 3 vessel arch  configuration is present. No significant atherosclerotic change or stenosis is present. Right carotid system: The right common carotid artery is within normal limits. The bifurcation is normal. The cervical right ICA is within normal limits. Left carotid system: The left common carotid artery is within normal limits. The bifurcation is unremarkable. The cervical left ICA is normal. Vertebral arteries: The vertebral arteries are codominant. Both vertebral arteries originate from the subclavian arteries. No significant stenosis is present in either vertebral artery in the neck. Skeleton: Mild degenerative changes of the cervical spine are most pronounced at C3-4. No focal osseous lesions are present. Other neck: Soft tissues the neck are otherwise unremarkable. Salivary glands are within normal limits. Thyroid is normal. No significant adenopathy is present. No focal mucosal or submucosal lesions are present. Upper chest: The lung apices are clear. The visualized upper abdomen is unremarkable. Review of the MIP images confirms the above findings CTA HEAD FINDINGS Anterior circulation: The internal carotid arteries are within normal limits from the skull base through the ICA termini. The A1 and M1 segments are normal. The anterior communicating artery is patent. The MCA bifurcations are within normal limits. The right MCA branch vessels and bilateral ACA branch vessels are normal. The left MCA branch vessels are decreased caliber compared to the right. No significant proximal stenosis or occlusion is present. Increased collateral vessels are present on the left. This may be secondary to treatment for the patient's known tumor. No aneurysm is present. Posterior circulation: The vertebral arteries are codominant. PICA origins are visualized and normal. The vertebrobasilar junction and basilar artery are normal. Both posterior cerebral arteries originate from basilar tip. The PCA branch vessels are within normal limits  bilaterally. Venous sinuses: The dural sinuses are patent. The straight sinus and deep cerebral veins are intact. Cortical veins are within normal limits. No significant vascular malformation is evident. Anatomic variants: None Review of the MIP images confirms the above findings IMPRESSION: 1. Decreased caliber of the left MCA branch vessels compared to the right. No significant proximal stenosis or occlusion is present. This is likely secondary to treatment for the patient's left-sided tumor. Increased collateral vessels are noted. 2. Normal appearance of the left M1 segment. No significant proximal stenosis, aneurysm or branch vessel occlusion within the Circle of Willis. 3. Normal CTA of the neck. These results were called by telephone at the time of interpretation on 09/22/2022 at 10:50 am to provider [facility], who verbally acknowledged these results. Electronically Signed   By: CSan MorelleM.D.   On: 09/22/2022 11:01   CT Head Wo Contrast  Result Date: 09/22/2022 CLINICAL DATA:  Mental status change. At 8:45 a.m. patient in having trouble speaking words and reading his phone. 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 EXAM: CT HEAD WITHOUT CONTRAST TECHNIQUE: Contiguous axial images were obtained from the base of the skull through the vertex without intravenous contrast. RADIATION DOSE REDUCTION: This exam was performed according to the departmental dose-optimization program which includes automated exposure control, adjustment of the mA and/or kV according to patient  size and/or use of iterative reconstruction technique. COMPARISON:  MR head without and with contrast 09/02/2011. Report of MRI brain without and with contrast [facility] 04/09/2022 FINDINGS: Brain: Patient is status post left frontal craniotomy resection of tumor. There is diffuse loss of gray-white differentiation at the left temporal tip hypoattenuation extends into  the inferior aspect of the insular cortex in the external capsule. Edema was described on the prior MRI at [facility]. The extent of the edema is unclear from the report. Subtle loss of gray-white differentiation is present in the left frontal operculum. Basal ganglia are intact. Mild subcortical white matter hypoattenuation is present in the right hemisphere. Loss of gray-white differentiation is present at the occipital poles bilaterally. No acute hemorrhage is present. Ex vacuo dilation of the left lateral ventricle is compatible with volume loss. The brainstem and cerebellum are within normal limits. 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 Vascular: A hyperdense left middle cerebral artery present with some asymmetry into the left ICA terminus. Skull: Left frontal parietal craniotomy noted. Calvarium otherwise intact. No significant extracranial soft tissue lesion is present. Sinuses/Orbits: The paranasal sinuses and mastoid air cells are clear. Bilateral lens replacements are noted. Globes and orbits are otherwise unremarkable. IMPRESSION: 1. Status post resection of tumor in the left middle cranial fossa with surrounding edema. 2. 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 3. Subtle loss of gray-white differentiation in the left frontal operculum. 4. Loss of gray-white differentiation at the occipital poles bilaterally is concerning for additional infarcts. 5. Recommend CTA of the head and neck to assess vascular occlusion. These results were called by telephone at the time of interpretation on 09/22/2022 at 10:30 am to provider Dr. Rory Percy, who verbally acknowledged these results. Electronically Signed   By: San Morelle M.D.   On: 09/22/2022 10:30    Scheduled Meds:  mouth rinse  15 mL Mouth Rinse Q2H   phenytoin (DILANTIN) IV  100 mg Intravenous Q8H   Continuous Infusions:  sodium  chloride     sodium chloride 75 mL/hr (09/22/22 1729)   levETIRAcetam Stopped (09/23/22 0003)     LOS: 1 day   Time spent: 52mn  Cesare C Iman Orourke, DO [facility]  If 7PM-7AM, please contact night-coverage [email or web address]  09/23/2022, 8:27 AM

## 2022-09-23 NOTE — Evaluation (Signed)
Physical Therapy Evaluation Patient Details Name: Nathan Prince MRN: [id number] DOB: December 04, 1965 Today's Date: 09/23/2022  History of Present Illness  Pt is 57 yo male who presented on 09/22/22 with stroke like symptoms found to have seizures.  Pt with hx of astrocytoma/oligodendroglioma treated with craniotomy/tumor excision  then radiation in 2013 and 2022, back surgery, depression.  Clinical Impression  Pt admitted with above diagnosis. 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  Today, PT evaluation was somewhat limited due to pt on continuous EEG.  Pt was able to get up with supervision and ambulated around room with supervision.  He did demonstrate one mild loss of balance but recovered on his own.  Pt did have some issues recalling events during admission and number of steps at home.  Pt expected to progress well and likely have no PT needs at d/c.  Pt currently with functional limitations due to the deficits listed below (see PT Problem List). Pt will benefit from skilled PT to increase their independence and safety with mobility to allow discharge to the venue listed below.          Recommendations for follow up therapy are one component of a multi-disciplinary discharge planning process, led by the attending physician.  Recommendations may be updated based on patient status, additional functional criteria and insurance authorization.  Follow Up Recommendations No PT follow up (likely no PT needs)      Assistance Recommended at Discharge PRN  Patient can return home with the following  Assist for transportation;Help with stairs or ramp for entrance    Equipment Recommendations None recommended by PT  Recommendations for Other Services       Functional Status Assessment Patient has had a recent decline in their functional status and demonstrates the ability to make significant improvements in function in a reasonable and predictable amount of time.      Precautions / Restrictions Precautions Precautions: Fall Restrictions Weight Bearing Restrictions: No      Mobility  Bed Mobility Overal bed mobility: Needs Assistance Bed Mobility: Supine to Sit, Sit to Supine     Supine to sit: Supervision Sit to supine: Supervision   General bed mobility comments: for lines    Transfers Overall transfer level: Needs assistance Equipment used: None Transfers: Sit to/from Stand Sit to Stand: Supervision                Ambulation/Gait Ambulation/Gait assistance: Supervision Gait Distance (Feet): 50 Feet Assistive device: None Gait Pattern/deviations: Step-through pattern Gait velocity: decreased but due to lines     General Gait Details: Limited due to on continuous EEG.  Pt ambulating in room and able to navigate around obstacles, turn, back, side step with supervision.  Did have 1 instability requiring min guard to recover. Reports feels mildly unstabe compared to baseline but has not been up in 2 days  Stairs            Wheelchair Mobility    Modified Rankin (Stroke Patients Only)       Balance Overall balance assessment: Needs assistance Sitting-balance support: No upper extremity supported Sitting balance-Leahy Scale: Good     Standing balance support: No upper extremity supported Standing balance-Leahy Scale: Good                               Pertinent Vitals/Pain Pain Assessment Pain Assessment: No/denies pain    Home Living Family/patient expects to be  discharged to:: Private residence Living Arrangements: Spouse/significant other Available Help at Discharge: Family;Available PRN/intermittently Type of Home: House Home Access: Stairs to enter Entrance Stairs-Rails: None Entrance Stairs-Number of Steps: 2; Pt also has 5-8 steps at work with rails   Home Layout: One level Home Equipment: None      Prior Function Prior Level of Function : Independent/Modified  Independent;Working/employed;Driving             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        Extremity/Trunk Assessment   Upper Extremity Assessment Upper Extremity Assessment: Overall WFL for tasks assessed    Lower Extremity Assessment Lower Extremity Assessment: Overall WFL for tasks assessed (ROM WFL; MMT 5/5; coordination WNL; sensation normal)    Cervical / Trunk Assessment Cervical / Trunk Assessment: Normal  Communication   Communication: No difficulties  Cognition Arousal/Alertness: Awake/alert Behavior During Therapy: WFL for tasks assessed/performed Overall Cognitive Status: Impaired/Different from baseline                                 General Comments: Wife reports feels that he is close to normal but seems "sleepy," meaning needing increased time, somewhat "foggy" on details etc.  Pt did have difficulty providing home environment and work environment (# of steps, rails, etc)        General Comments      Exercises     Assessment/Plan    PT Assessment Patient needs continued PT services  PT Problem List Decreased activity tolerance;Decreased knowledge of use of DME;Decreased balance;Decreased safety awareness;Decreased mobility       PT Treatment Interventions DME instruction;Therapeutic exercise;Gait training;Balance training;Stair training;Neuromuscular re-education;Functional mobility training;Therapeutic activities;Patient/family education    PT Goals (Current goals can be found in the Care Plan section)  Acute Rehab PT Goals Patient Stated Goal: return home when able PT Goal Formulation: With patient/family Time For Goal Achievement: 10/07/22 Potential to Achieve Goals: Good Additional Goals Additional Goal #1: Pt will score >19 on DGI to indicate low fall risk    Frequency Min 3X/week     Co-evaluation               AM-PAC PT "6 Clicks" Mobility  Outcome Measure Help  needed turning from your back to your side while in a flat bed without using bedrails?: None Help needed moving from lying on your back to sitting on the side of a flat bed without using bedrails?: A Little Help needed moving to and from a bed to a chair (including a wheelchair)?: A Little Help needed standing up from a chair using your arms (e.g., wheelchair or bedside chair)?: A Little Help needed to walk in hospital room?: A Little Help needed climbing 3-5 steps with a railing? : A Little 6 Click Score: 19    End of Session Equipment Utilized During Treatment: Gait belt Activity Tolerance: Patient tolerated treatment well Patient left: in bed;with call bell/phone within reach;with bed alarm set;with family/visitor present Nurse Communication: Mobility status PT Visit Diagnosis: Unsteadiness on feet (R26.81)    Time: 2119-4174 PT Time Calculation (min) (ACUTE ONLY): 25 min   Charges:   PT Evaluation $PT Eval Low Complexity: 1 Low PT Treatments $Gait Training: 8-22 mins        Abran Richard, PT Acute Rehab [facility] [phone]   Nathan Prince 09/23/2022, 5:18 PM

## 2022-09-23 NOTE — Procedures (Addendum)
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 EEG to evaluate for seizure.   Level of alertness: Awake, asleep   AEDs during EEG study: Fosphenytoin, LEV, LCM   Technical aspects: This EEG study was done with scalp electrodes positioned according to the 10-20 International system of electrode placement. Electrical activity was reviewed with band pass filter of 1-'70Hz'$ , sensitivity of 7 uV/mm, display speed of 8m/sec with a '60Hz'$  notched filter applied as appropriate. EEG data were recorded continuously and digitally stored.  Video monitoring was available and reviewed as appropriate.   Description: The posterior dominant rhythm consists of 8 Hz activity of moderate voltage (25-35 uV) seen predominantly in posterior head regions, asymmetric ( left<right) and reactive to eye opening and eye closing. Sleep was characterized by sleep spindles (12-'14hz'$ ) , maximal fronto-central region. EEG showed continuous 3 to 6 Hz theta-delta slowing in left hemisphere. Lateralized periodic discharges at '1Hz'$  were alos noted in left hemisphwre at timex with overriding rhythmicity lasting 3 to 5 seconds consistent with brief ictal-interictal rhythmic discharges.  Hyperventilation and photic stimulation were not performed.     Seizure without clinical signs were noted on 09/22/2022 at 2022 and on 09/23/2022 at 0[place] 0634.  During the seizure, lateralized periodic discharges in left hemisphere evolved in morphology and frequency.  Average duration of seizures was about 45 seconds.    ABNORMALITY - Seizure without clinical signs, left hemisphere - Brief ictal-interictal rhythmic discharges, left hemisphere - Lateralized periodic discharges with overriding rhyhtmicity ( LPD +R) left hemisphere - background asymmetry, left<right - Continuous slow, left hemisphere   IMPRESSION: This study showed three seizures without clinical sign son 09/22/2022 at 2022 and on 09/23/2022 at 0351, 0634 lasting 45 seconds each. Additionally there is evidence of cortical dysfunction and epileptogenicity in left hemisphere with increased risk of seizures.  Nathan Prince

## 2022-09-23 NOTE — ED Notes (Signed)
Attempted to obtain blood and urine samples per orders; pt became very agitated and attempted to get out of bed. He then attempted to use a urinal but was not able to catch a sample. Provided full linen change and applied a clean brief and chux pad. Pt repositioned. Wife remains at bedside.

## 2022-09-23 NOTE — Progress Notes (Signed)
Lean maintenance complete, tech fixed of P7. C4 and T5. All impeds under 10, and no skin breakdown at all skin sites.

## 2022-09-23 NOTE — ED Notes (Signed)
Patient woke and used the urinal , per wife who is at the bedside , he is pretty much back to his baseline. Speech is a little "thick" sounding however he is alert and oriented x 4 , denies pain.

## 2022-09-23 NOTE — Plan of Care (Signed)
  Problem: Education: Goal: Knowledge of disease or condition will improve Outcome: Progressing   

## 2022-09-23 NOTE — ED Notes (Signed)
ED TO INPATIENT HANDOFF REPORT  ED Nurse Name and Phone #: Ivin Poot Name/Age/Gender Blythe Stanford 57 y.o. male Room/Bed: 013C/013C  Code Status   Code Status: Full Code  Home/SNF/Other Home Patient oriented to: self, place, time, and situation Is this baseline? Yes   Triage Complete: Triage complete  Chief Complaint Stroke-like symptoms [R29.90]  Triage Note Pt arrives pov with wife, steady gait with standby assist, wife endorses at ~ 42 was having trouble speaking words. And was having trouble reading iphone. Was advised by friend to go to ED. Wife reports pt confused and "moving slow". Wife reports speech slurred. Last normal 2000 yesterday  Pt bib [place].   LKW 2300   Around 830 had slurred speech. Pt walked to his room at [facility] but pt was unable to push/pull commands.   Hx brain cancer  Pt received 4000 mg Keppra and NS 168m/hr. Ativan  BP 137/95 HR 80 RR18 RA 94%   Allergies No Known Allergies  Level of Care/Admitting Diagnosis ED Disposition     ED Disposition  Admit   Condition  --   Comment  [facility] [id number]  Level of Care: Progressive [102]  Admit to Progressive based on following criteria: NEUROLOGICAL AND NEUROSURGICAL complex patients with significant risk of instability, who do not meet ICU criteria, yet require close observation or frequent assessment (< / = every 2 - 4 hours) with medical / nursing intervention.  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   Depression    Low testosterone 03/30/2013   Radiation 11/04/11-12/19/11   Left temporal brain 59.4 gray in 33 fractions   Skin cancer 09/09/1986   Past Surgical History:  Procedure Laterality Date   [place]  ruptured disc   CRANIOTOMY  09/04/2011   Procedure: CRANIOTOMY TUMOR EXCISION;  Surgeon: EFloyce Stakes  [facility];  Service: Neurosurgery;  Laterality: Left;  Left Temporal craniectomy for tumor   MASS BIOPSY  09/05/2011   left temporal lobe     A IV Location/Drains/Wounds Patient Lines/Drains/Airways Status     Active Line/Drains/Airways     Name Placement date Placement time Site Days   Peripheral IV 09/22/22 20 G 1" Left;Posterior Hand 09/22/22  0940  Hand  1   Peripheral IV 09/22/22 20 G Right Antecubital 09/22/22  0953  Antecubital  1            Intake/Output Last 24 hours  Intake/Output Summary (Last 24 hours) at 09/23/2022 1252 Last data filed at 09/23/2022 1019 Gross per 24 hour  Intake 288.09 ml  Output 1100 ml  Net -811.91 ml    Labs/Imaging Results for orders placed or performed during the hospital encounter of 09/22/22 (from the past 48 hour(s))  CBG monitoring, ED     Status: Abnormal   Collection Time: 09/22/22  9:39 AM  Result Value Ref Range   Glucose-Capillary  101 (H) 70 - 99 mg/dL    Comment: Glucose reference range applies only to samples taken after fasting for at least 8 hours.  CBC     Status: Abnormal   Collection Time: 09/22/22  9:52 AM  Result Value Ref Range   WBC 15.0 (H) 4.0 - 10.5 K/uL   RBC 5.64 4.22 - 5.81 MIL/uL   Hemoglobin 15.3 13.0 - 17.0 g/dL   HCT 45.7 39.0 - 52.0 %   MCV 81.0 80.0 - 100.0 fL   MCH 27.1 26.0 - 34.0 pg   MCHC 33.5 30.0 - 36.0 g/dL   RDW 13.5 11.5 - 15.5 %   Platelets 270 150 - 400 K/uL   nRBC 0.0 0.0 - 0.2 %    Comment: Performed  at [facility], [address] [place], [place] [address]  Differential     Status: Abnormal   Collection Time: 09/22/22  9:52 AM  Result Value Ref Range   Neutrophils Relative % 77 %   Neutro Abs 11.6 (H) 1.7 - 7.7 K/uL   Lymphocytes Relative 13 %   Lymphs Abs 1.9 0.7 - 4.0 K/uL   Monocytes Relative 9 %   Monocytes Absolute 1.4 (H) 0.1 - 1.0 K/uL   Eosinophils Relative 0 %   Eosinophils Absolute 0.0 0.0 - 0.5 K/uL   Basophils Relative 0 %   Basophils Absolute 0.1 0.0 - 0.1 K/uL   Immature Granulocytes 1 %   Abs Immature Granulocytes 0.10 (H) 0.00 - 0.07 K/uL    Comment: Performed at [facility], [address] [place], [place] [address]  Comprehensive metabolic panel     Status: Abnormal   Collection Time: 09/22/22  9:52 AM  Result Value Ref Range   Sodium 131 (L) 135 - 145 mmol/L   Potassium 3.7 3.5 - 5.1 mmol/L   Chloride 96 (L) 98 - 111 mmol/L   CO2 24 22 - 32 mmol/L   Glucose, Bld 95 70 - 99 mg/dL    Comment: Glucose reference range applies only to samples taken after fasting for at least 8 hours.   BUN 18 6 - 20 mg/dL   Creatinine, Ser 1.07 0.61 - 1.24 mg/dL   Calcium 9.0 8.9 - 10.3 mg/dL   Total Protein 7.4 6.5 - 8.1 g/dL   Albumin 4.2 3.5 - 5.0 g/dL   AST 27 15 - 41 U/L   ALT 30 0 - 44 U/L   Alkaline Phosphatase 75 38 - 126 U/L   Total Bilirubin 0.5 0.3 - 1.2 mg/dL   GFR, Estimated >60 >60 mL/min    Comment: (NOTE) Calculated using the CKD-EPI Creatinine Equation (2021)    Anion gap 11 5 - 15    Comment: Performed at [facility], [address] [place], [place] [address]  Urinalysis, Routine w reflex microscopic Urine, Clean Catch     Status: Abnormal   Collection Time: 09/22/22 11:25 AM  Result Value Ref Range   Color, Urine YELLOW YELLOW   APPearance HAZY (A) CLEAR   Specific Gravity, Urine 1.015 1.005 - 1.030   pH 7.5 5.0 - 8.0   Glucose, UA NEGATIVE NEGATIVE mg/dL   Hgb urine dipstick NEGATIVE NEGATIVE   Bilirubin  Urine NEGATIVE NEGATIVE   Ketones, ur NEGATIVE NEGATIVE mg/dL   Protein, ur NEGATIVE NEGATIVE mg/dL   Nitrite NEGATIVE NEGATIVE   Leukocytes,Ua NEGATIVE NEGATIVE    Comment: Microscopic not done on urines with negative protein, blood,  leukocytes, nitrite, or glucose < 500 mg/dL. Performed at [facility], [address] [place], [place] [address]   POC CBG, ED     Status: Abnormal   Collection Time: 09/22/22 12:00 PM  Result Value Ref Range   Glucose-Capillary 108 (H) 70 - 99 mg/dL    Comment: Glucose reference range applies only to samples taken after fasting for at least 8 hours.  HIV Antibody (routine testing w rflx)     Status: None   Collection Time: 09/23/22  4:03 AM  Result Value Ref Range   HIV Screen 4th Generation wRfx Non Reactive Non Reactive    Comment: Performed at [facility], [place] [address] [place], [place] [address]  Hemoglobin A1c     Status: None   Collection Time: 09/23/22  4:03 AM  Result Value Ref Range   Hgb A1c MFr Bld 5.5 4.8 - 5.6 %    Comment: (NOTE) Pre diabetes:          5.7%-6.4%  Diabetes:              >6.4%  Glycemic control for   <7.0% adults with diabetes    Mean Plasma Glucose 111.15 mg/dL    Comment: Performed at [facility] [address], [place], [place] [address]  Lipid panel     Status: Abnormal   Collection Time: 09/23/22  4:03 AM  Result Value Ref Range   Cholesterol 139 0 - 200 mg/dL   Triglycerides 120 <150 mg/dL   HDL 35 (L) >40 mg/dL   Total CHOL/HDL Ratio 4.0 RATIO   VLDL 24 0 - 40 mg/dL   LDL Cholesterol 80 0 - 99 mg/dL    Comment:        Total Cholesterol/HDL:CHD Risk Coronary Heart Disease Risk Table                     Men   Women  1/2 Average Risk   3.4   3.3  Average Risk       5.0   4.4  2 X Average Risk   9.6   7.1  3 X Average Risk  23.4   11.0        Use the calculated Patient Ratio above and the CHD Risk Table to determine the patient's CHD Risk.        ATP III CLASSIFICATION  (LDL):  <100     mg/dL   Optimal  100-129  mg/dL   Near or Above                    Optimal  130-159  mg/dL   Borderline  160-189  mg/dL   High  >190     mg/dL   Very High Performed at [facility] [address], [place], [place] [address]   Urine rapid drug screen (hosp performed)not at [facility]     Status: None   Collection Time: 09/23/22  5:15 AM  Result Value Ref Range   Opiates NONE DETECTED NONE DETECTED   Cocaine NONE DETECTED NONE DETECTED   Benzodiazepines NONE DETECTED NONE DETECTED   Amphetamines NONE DETECTED NONE DETECTED   Tetrahydrocannabinol NONE DETECTED NONE DETECTED   Barbiturates NONE DETECTED NONE DETECTED    Comment: (NOTE) DRUG SCREEN FOR MEDICAL PURPOSES ONLY.  IF CONFIRMATION IS NEEDED FOR ANY PURPOSE, NOTIFY LAB WITHIN 5 DAYS.  LOWEST DETECTABLE LIMITS FOR URINE DRUG SCREEN Drug Class  Cutoff (ng/mL) Amphetamine and metabolites    1000 Barbiturate and metabolites    200 Benzodiazepine                 200 Opiates and metabolites        300 Cocaine and metabolites        300 THC                            50 Performed at [facility], [place] [address], [place], [place] [address]    Overnight EEG with video  Result Date: 09/23/2022 Lora Havens, MD     09/23/2022 10:50 AM atient Name: Naftula Donahue MRN: [id number] Epilepsy Attending: Lora Havens Referring Physician/Provider: Greta Doom, MD Duration: 09/22/2022 1322 to 09/23/2022 1045  Patient history:  57 year old man with a past medical history of 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 EEG to evaluate for seizure.  Level of alertness: Awake, asleep  AEDs during EEG study: Fosphenytoin, LEV, LCM  Technical aspects: This EEG study was done with scalp electrodes  positioned according to the 10-20 International system of electrode placement. Electrical activity was reviewed with band pass filter of 1-'70Hz'$ , sensitivity of 7 uV/mm, display speed of 31m/sec with a '60Hz'$  notched filter applied as appropriate. EEG data were recorded continuously and digitally stored.  Video monitoring was available and reviewed as appropriate.  Description: The posterior dominant rhythm consists of 8 Hz activity of moderate voltage (25-35 uV) seen predominantly in posterior head regions, asymmetric ( left<right) and reactive to eye opening and eye closing. Sleep was characterized by sleep spindles (12-'14hz'$ ) , maximal fronto-central region. EEG showed continuous 3 to 6 Hz theta-delta slowing in left hemisphere. Lateralized periodic discharges were alos noted in left hemisphwre at timex with overriding rhythmicity lasting 3 to 5 seconds consistent with brief ictal-interictal rhythmic discharges.  Hyperventilation and photic stimulation were not performed.   Seizure without clinical signs were noted on 09/22/2022 at 2022 and on 09/23/2022 at [place] 0634.  During the seizure, lateralized periodic discharges in left hemisphere evolved in morphology and frequency.  Average duration of seizures was about 45 seconds.  ABNORMALITY - Seizure without clinical signs, left hemisphere - Brief ictal-interictal rhythmic discharges, left hemisphere - Lateralized periodic discharges with overriding rhyhtmicity ( LPD +R) left hemisphere - background asymmetry, left<right - Continuous slow, left hemisphere  IMPRESSION: This study showed three seizures without clinical sign son 09/22/2022 at 2022 and on 09/23/2022 at 0351, 0634 lasting 45 seconds each. Additionally there is evidence of cortical dysfunction and epileptogenicity in left hemisphere with increased risk of seizures. 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 EEG to evaluate for seizure. Level of alertness: Awake AEDs during EEG study: Fosphenytoin Technical aspects: This EEG study was done with  scalp electrodes positioned according to the 10-20 International system of electrode placement. Electrical activity was reviewed with band pass filter of 1-'70Hz'$ , sensitivity of 7 uV/mm, display speed of 80m/sec with a '60Hz'$  notched filter applied as appropriate. EEG data were recorded continuously and digitally stored.  Video monitoring was available and reviewed as appropriate. Description: The posterior dominant rhythm consists of 8 Hz activity of moderate voltage (25-35 uV) seen predominantly in posterior head regions, asymmetric ( left<right) and reactive to eye opening and eye closing. EEG showed continuous 3 to 6 Hz theta-delta slowing in left hemisphere. Lateralized periodic discharges were alos noted in left hemisphwre at timex with overriding rhythmicity. At 1301, EEG also showed evolution in morphology and frequency lasting 12 seconds consistent with seizure without clinical signs twice. Hyperventilation and photic stimulation were not performed.   ABNORMALITY - Seizure without clinical signs, left hemisphere - Lateralized periodic discharges with overriding rhyhtmicity ( LPD +R) left hemisphere - background asymmetry, left<right - Continuous slow, left hemisphere IMPRESSION: This study showed two seizures without clinical signs at 1301 lasting 12 seconds each. Additionally there are lateralized periodic discharges with overlying rhythmicity in left  hemisphere which is on the ictal-interictal continuum and high risk of seizures. There is also cortical dysfunction in left hemisphere suggestive of underlying strcutural abnormality. Dr. KLeonel Ramsaywas notified. Priyanka OBarbra Sarks  CT ANGIO HEAD NECK W WO CM (CODE STROKE)  Result Date: 09/22/2022 CLINICAL DATA:  New onset aphasia and unsteady gait. Abnormal CT of the head. EXAM: CT ANGIOGRAPHY HEAD AND NECK TECHNIQUE: Multidetector CT imaging of the head and neck was performed using the standard protocol during bolus administration of intravenous contrast. Multiplanar CT image reconstructions and MIPs were obtained to evaluate the vascular anatomy. Carotid stenosis measurements (when applicable) are obtained utilizing NASCET criteria, using the distal internal carotid diameter as the denominator. RADIATION DOSE REDUCTION: This exam was performed according to the departmental dose-optimization program which includes automated exposure control, adjustment of the mA and/or kV according to patient size and/or use of iterative reconstruction technique. CONTRAST:  812mOMNIPAQUE IOHEXOL 350 MG/ML SOLN COMPARISON:  CT head without contrast 09/22/2022. FINDINGS: CTA NECK FINDINGS Aortic arch: A 3 vessel arch configuration is present. No significant atherosclerotic change or stenosis is present. Right carotid system: The right common carotid artery is within normal limits. The bifurcation is normal. The cervical right ICA is within normal limits. Left carotid system: The left common carotid artery is within normal limits. The bifurcation is unremarkable. The cervical left ICA is normal. Vertebral arteries: The vertebral arteries are codominant. Both vertebral arteries originate from the subclavian arteries. No significant stenosis is present in either vertebral artery in the neck. Skeleton: Mild degenerative changes of the cervical spine are most pronounced at C3-4. No focal osseous lesions are present. Other neck: Soft  tissues the neck are otherwise unremarkable. Salivary glands are within normal limits. Thyroid is normal. No significant adenopathy is present. No focal mucosal or submucosal lesions are present. Upper chest: The lung apices are clear. The visualized upper abdomen is unremarkable. Review of the MIP images confirms the above findings CTA HEAD FINDINGS Anterior circulation: The internal carotid arteries are within normal limits from the skull base through the ICA termini. The A1 and M1 segments are normal. The anterior communicating artery is patent. The MCA bifurcations are within normal limits. The right MCA branch vessels and bilateral ACA branch vessels are normal. The left MCA branch vessels are decreased caliber compared to the right. No  significant proximal stenosis or occlusion is present. Increased collateral vessels are present on the left. This may be secondary to treatment for the patient's known tumor. No aneurysm is present. Posterior circulation: The vertebral arteries are codominant. PICA origins are visualized and normal. The vertebrobasilar junction and basilar artery are normal. Both posterior cerebral arteries originate from basilar tip. The PCA branch vessels are within normal limits bilaterally. Venous sinuses: The dural sinuses are patent. The straight sinus and deep cerebral veins are intact. Cortical veins are within normal limits. No significant vascular malformation is evident. Anatomic variants: None Review of the MIP images confirms the above findings IMPRESSION: 1. Decreased caliber of the left MCA branch vessels compared to the right. No significant proximal stenosis or occlusion is present. This is likely secondary to treatment for the patient's left-sided tumor. Increased collateral vessels are noted. 2. Normal appearance of the left M1 segment. No significant proximal stenosis, aneurysm or branch vessel occlusion within the Circle of Willis. 3. Normal CTA of the neck. These results  were called by telephone at the time of interpretation on 09/22/2022 at 10:50 am to provider [facility] , who verbally acknowledged these results. Electronically Signed   By: San Morelle M.D.   On: 09/22/2022 11:01   CT Head Wo Contrast  Result Date: 09/22/2022 CLINICAL DATA:  Mental status change. At 8:45 a.m. patient in having trouble speaking words and reading his phone. 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 EXAM: CT HEAD WITHOUT CONTRAST TECHNIQUE: Contiguous axial images were obtained from the base of the skull through the vertex without intravenous contrast. RADIATION DOSE REDUCTION: This exam was performed according to the departmental dose-optimization program which includes automated exposure control, adjustment of the mA and/or kV according to patient size and/or use of iterative reconstruction technique. COMPARISON:  MR head without and with contrast 09/02/2011. Report of MRI brain without and with contrast [facility] 04/09/2022 FINDINGS: Brain: Patient is status post left frontal craniotomy resection of tumor. There is diffuse loss of gray-white differentiation at the left temporal tip hypoattenuation extends into the inferior aspect of the insular cortex in the external capsule. Edema was described on the prior [facility] The extent of the edema is unclear from the report. Subtle loss of gray-white differentiation is present in the left frontal operculum. Basal ganglia are intact. Mild subcortical white matter hypoattenuation is present in the right hemisphere. Loss of gray-white differentiation is present at the occipital poles bilaterally. No acute hemorrhage is present. Ex vacuo dilation of the left lateral ventricle is compatible with volume loss. The brainstem and cerebellum are within normal limits. 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 Vascular: A hyperdense left middle cerebral artery present with some  asymmetry into the left ICA terminus. Skull: Left frontal parietal craniotomy noted. Calvarium otherwise intact. No significant extracranial soft tissue lesion is present. Sinuses/Orbits: The paranasal sinuses and mastoid air cells are clear. Bilateral lens replacements are noted. Globes and orbits are otherwise unremarkable. IMPRESSION: 1. Status post resection of tumor in the left middle cranial fossa with surrounding edema. 2. 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 3. Subtle loss of gray-white differentiation in the left frontal operculum. 4. Loss of gray-white differentiation at the occipital poles bilaterally is concerning for additional infarcts. 5. Recommend CTA of the head and neck to assess vascular occlusion. These results were called by  telephone at the time of interpretation on 09/22/2022 at 10:30 am to provider Dr. Rory Percy, who verbally acknowledged these results. Electronically Signed   By: San Morelle M.D.   On: 09/22/2022 10:30    Pending Labs Unresulted Labs (From admission, onward)    None       Vitals/Pain Today's Vitals   09/23/22 0600 09/23/22 0800 09/23/22 0853 09/23/22 1200  BP: (!) 126/90 132/85  (!) 117/90  Pulse: 79 73  87  Resp: (!) 24 (!) 23  15  Temp:   98 F (36.7 C)   TempSrc:   Oral   SpO2: 98% 92%  95%  Weight:      PainSc:        Isolation Precautions No active isolations  Medications Medications  acetaminophen (TYLENOL) tablet 650 mg (has no administration in time range)    Or  acetaminophen (TYLENOL) 160 MG/5ML solution 650 mg (has no administration in time range)    Or  acetaminophen (TYLENOL) suppository 650 mg (has no administration in time range)  senna-docusate (Senokot-S) tablet 1 tablet (has no administration in time range)  Oral care mouth rinse (15 mLs Mouth Rinse Not Given 09/23/22 1206)  Oral care mouth rinse (has no  administration in time range)  0.9 %  sodium chloride infusion (75 mL/hr Intravenous New Bag/Given 09/23/22 1006)  levETIRAcetam (KEPPRA) IVPB 1500 mg/ 100 mL premix (0 mg Intravenous Stopped 09/23/22 1019)  phenytoin (DILANTIN) injection 100 mg (100 mg Intravenous Given 09/23/22 0603)  iohexol (OMNIPAQUE) 350 MG/ML injection 80 mL (80 mLs Intravenous Contrast Given 09/22/22 1036)  LORazepam (ATIVAN) injection 2 mg (2 mg Intravenous Given 09/22/22 1102)  levETIRAcetam (KEPPRA) IVPB 1000 mg/100 mL premix (0 mg Intravenous Stopped 09/22/22 1128)    Followed by  levETIRAcetam (KEPPRA) IVPB 1000 mg/100 mL premix (0 mg Intravenous Stopped 09/22/22 1243)    Followed by  levETIRAcetam (KEPPRA) IVPB 1000 mg/100 mL premix (1,000 mg Intravenous Given by EMS 09/22/22 1318)    Followed by  levETIRAcetam (KEPPRA) IVPB 1000 mg/100 mL premix (1,000 mg Intravenous Given by EMS 09/22/22 1323)  0.9 %  sodium chloride infusion (0 mLs Intravenous Stopped 09/22/22 1243)  LORazepam (ATIVAN) injection 2 mg (2 mg Intravenous Given 09/22/22 1421)  fosPHENYtoin (CEREBYX) 1,500 mg PE in sodium chloride 0.9 % 50 mL IVPB (0 mg PE Intravenous Stopped 09/22/22 1534)   stroke: early stages of recovery book ( Does not apply Given 09/22/22 1728)  lacosamide (VIMPAT) 200 mg in sodium chloride 0.9 % 25 mL IVPB (0 mg Intravenous Stopped 09/22/22 2115)    Mobility walks with person assist High fall risk   Focused Assessments Neuro Assessment Handoff:  Swallow screen pass? Yes  Cardiac Rhythm: Normal sinus rhythm NIH Stroke Scale  Dizziness Present: No Headache Present: No Interval: Other (Comment) Level of Consciousness (1a.)   : Alert, keenly responsive LOC Questions (1b. )   : Answers both questions correctly LOC Commands (1c. )   : Performs both tasks correctly Best Gaze (2. )  : Normal Visual (3. )  : No visual loss Facial Palsy (4. )    : Normal symmetrical movements Motor Arm, Left (5a. )   : No drift Motor Arm, Right  (5b. ) : No drift Motor Leg, Left (6a. )  : No drift Motor Leg, Right (6b. ) : No drift Limb Ataxia (7. ): Absent Sensory (8. )  : Normal, no sensory loss Best Language (9. )  : Mild-to-moderate aphasia Dysarthria (10. ):  Mild-to-moderate dysarthria, patient slurs at least some words and, at worst, can be understood with some difficulty Extinction/Inattention (11.)   : No Abnormality Complete NIHSS TOTAL: 2 Last date known well: 09/21/22 Last time known well: 2000 Neuro Assessment: Exceptions to [facility] Neuro Checks:   Initial (09/22/22 0955)  Has TPA been given? No If patient is a Neuro Trauma and patient is going to OR before floor call report to [place] nurse: [phone] or [phone]   R Recommendations: See Admitting Provider Note  Report given to:   Additional Notes:

## 2022-09-23 NOTE — Progress Notes (Signed)
Pt currently asleep. Admission questions reviewed with wife Santiago Glad at bedside

## 2022-09-23 NOTE — Progress Notes (Addendum)
Subjective: Wife at bedside.  Both patient and wife state patient's speech is significantly improved.  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  Imaging  CT head without contrast 09/22/2022: 1. Status post resection of tumor in the left middle cranial fossa with surrounding edema. 2. 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 3. Subtle loss of gray-white differentiation in the left frontal operculum. 4. Loss of gray-white differentiation at the occipital poles bilaterally is concerning for additional infarcts.  CTA head and neck w and wo contrast 09/22/2022:  1. Decreased caliber of the left MCA branch vessels compared to the right. No significant proximal stenosis or occlusion is present. This is likely secondary to treatment for the patient's left-sided tumor. Increased collateral vessels are noted. 2. Normal appearance of the left M1 segment. No significant proximal stenosis, aneurysm or branch vessel occlusion within the Circle of Willis. 3.  Normal CTA of the neck.   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 EEG showed seizure without clinical signs and LPDs with overlying rhythmicity in left hemisphere.  Epilepsy with breakthrough seizure - Denies missing medications, recent sinusitis, Mild leucocytosis likely sec to seizure, UA bland  Recommendations - Increase keppra to '200mg'$  BID as patient had 3 more seizures overnight lasting 45 seconds each - continue dilantin '100mg'$  Q8 h, will check levels - if any further seizures, can increase dilantin depending on levels or add vimpat - Of note, if patient remains seizure free, would ideally prefer transitioning him off of dilantin in long term - continue LTM eeg overnight - plan for mri brain after eeg is done - continue seizure precautions - PRN IV versed for clinical sz lasting over 2 minutes - Had f/u scheduled with epilepsy at [facility] tomorrow, recommended changing appt to next week if possible - discussed plan with patient and wife at bedside  I have spent a total of 55 minutes with the patient reviewing hospital notes,  test results, labs and examining the patient as well as establishing an assessment and plan that was discussed personally with the patient.  > 50% of time was spent in direct patient care.    Zeb Comfort  Epilepsy [facility] For questions after 5pm please refer to AMION to reach the Neurologist on call

## 2022-09-24 DIAGNOSIS — G40919 Epilepsy, unspecified, intractable, without status epilepticus: Secondary | ICD-10-CM | POA: Diagnosis not present

## 2022-09-24 DIAGNOSIS — R569 Unspecified convulsions: Secondary | ICD-10-CM | POA: Diagnosis not present

## 2022-09-24 MED ORDER — LEVETIRACETAM 750 MG PO TABS
2000.0000 mg | ORAL_TABLET | Freq: Two times a day (BID) | ORAL | Status: DC
  Administered 2022-09-24 – 2022-09-26 (×4): 2000 mg via ORAL
  Filled 2022-09-24 (×4): qty 1

## 2022-09-24 MED ORDER — LACOSAMIDE 200 MG PO TABS
200.0000 mg | ORAL_TABLET | Freq: Two times a day (BID) | ORAL | Status: DC
  Administered 2022-09-24 – 2022-09-26 (×5): 200 mg via ORAL
  Filled 2022-09-24 (×5): qty 1

## 2022-09-24 MED ORDER — SODIUM CHLORIDE 0.9 % IV SOLN
2000.0000 mg | Freq: Once | INTRAVENOUS | Status: AC
  Administered 2022-09-24: 2000 mg via INTRAVENOUS
  Filled 2022-09-24: qty 20

## 2022-09-24 NOTE — Progress Notes (Signed)
LTM maint complete - no skin breakdown  Service [facility] monitored, Event button test confirmed by [facility].

## 2022-09-24 NOTE — Progress Notes (Signed)
PROGRESS NOTE    Nathan Prince  URK:[id number] DOB: 19-Jun-1966 DOA: 09/22/2022 PCP: Orpah Melter, MD   Brief Narrative:  Nathan Prince is a 57 y.o. 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 He has a h/o atypical meningioma and astrocytoma that was treated with radiation in 2013 and again in 2022.  He had been clinically and radiographically stable and was last seen in 04/2022. 24h prior to admission noted to have word finding difficulties/difficulty reading with subsequent receptive/expressive aphasia and altered mental status/confusion/combative nature. [facility] called for admission, Neuro called in consult  Assessment & Plan:   Principal Problem:   Stroke-like symptoms Active Problems:   Astrocytoma (HCC)   Seizure ([place])  Acute metabolic encephalopathy Abnormal speech pattern Rule out seizure Stroke ruled out Chronic history of brain masses s/p radiation/surgery -Initial concern for CVA due to CT but seizures currently appear to be more likely -EEG abnormal -continues to show seizures -Underlying malignancy is also a consideration, MRI without acute process -Neurology consulting - appreciate insight/recs -Patient loaded with fosphenytoin and then transitioned to [place], phenytoin -Primary neuro-oncology care is at [facility], will need outpatient neuro f/u there assuming he stabilizes  -PT/OT/ST, TOC teams following  DVT prophylaxis: Holding, low risk - early ambulation/OOB as tolerated Code Status: Full Family Communication: At bedside  Status is: Inpt  Dispo: The patient is from: Home              Anticipated d/c is to: Home              Anticipated d/c date is: 24-48h pending neuro clearance              Patient currently NOT medically stable for discharge  Consultants:  Neuro  Procedures:  None  Antimicrobials:  None   Subjective: No acute issues/events overnight - more  awake/alert/oriented this morning.  Objective: Vitals:   09/23/22 2036 09/23/22 2037 09/23/22 2353 09/24/22 0304  BP: 135/85 135/85 (!) 142/83 (!) 142/93  Pulse: 87 87 78 72  Resp: '17 20 17 17  '$ Temp: 98.2 F (36.8 C) 98.2 F (36.8 C) 99.6 F (37.6 C) 98.7 F (37.1 C)  TempSrc: Oral Oral Oral Oral  SpO2: 96% 96% 98% 97%  Weight:        Intake/Output Summary (Last 24 hours) at 09/24/2022 0742 Last data filed at 09/24/2022 0305 Gross per 24 hour  Intake 2841.13 ml  Output 800 ml  Net 2041.13 ml    Filed Weights   09/22/22 0938  Weight: 105.2 kg    Examination:  General exam: Appears calm and comfortable  Respiratory system: Clear to auscultation. Respiratory effort normal. Cardiovascular system: S1 & S2 heard, RRR. No JVD, murmurs, rubs, gallops or clicks. No pedal edema. Gastrointestinal system: Abdomen is nondistended, soft and nontender. No organomegaly or masses felt. Normal bowel sounds heard. Central nervous system: Alert and oriented. No focal neurological deficits. Extremities: Symmetric 5 x 5 power.  Data Reviewed: I have personally reviewed following labs and imaging studies  CBC: Recent Labs  Lab 09/22/22 0952  WBC 15.0*  NEUTROABS 11.6*  HGB 15.3  HCT 45.7  MCV 81.0  PLT 831    Basic Metabolic Panel: Recent Labs  Lab 09/22/22 0952  NA 131*  K 3.7  CL 96*  CO2 24  GLUCOSE 95  BUN 18  CREATININE 1.07  CALCIUM 9.0    GFR: CrCl cannot be calculated (Unknown ideal weight.). Liver Function Tests: Recent Labs  Lab 09/22/22 0952  AST 27  ALT 30  ALKPHOS 75  BILITOT 0.5  PROT 7.4  ALBUMIN 4.2    No results for input(s): "LIPASE", "AMYLASE" in the last 168 hours. No results for input(s): "AMMONIA" in the last 168 hours. Coagulation Profile: No results for input(s): "INR", "PROTIME" in the last 168 hours. Cardiac Enzymes: No results for input(s): "CKTOTAL", "CKMB", "CKMBINDEX", "TROPONINI" in the last 168 hours. BNP (last 3  results) No results for input(s): "PROBNP" in the last 8760 hours. HbA1C: Recent Labs    09/23/22 0403  HGBA1C 5.5    CBG: Recent Labs  Lab 09/22/22 0939 09/22/22 1200  GLUCAP 101* 108*    Lipid Profile: Recent Labs    09/23/22 0403  CHOL 139  HDL 35*  LDLCALC 80  TRIG 120  CHOLHDL 4.0    Thyroid Function Tests: No results for input(s): "TSH", "T4TOTAL", "FREET4", "T3FREE", "THYROIDAB" in the last 72 hours. Anemia Panel: No results for input(s): "VITAMINB12", "FOLATE", "FERRITIN", "TIBC", "IRON", "RETICCTPCT" in the last 72 hours. Sepsis Labs: No results for input(s): "PROCALCITON", "LATICACIDVEN" in the last 168 hours.  No results found for this or any previous visit (from the past 240 hour(s)).       Radiology Studies: MR Brain W and Wo Contrast  Result Date: 09/23/2022 CLINICAL DATA:  History of atypical meningioma astrocytoma treated with radiation in 2013 and 20 presents with stroke-like symptoms. EXAM: MRI HEAD WITHOUT AND WITH CONTRAST TECHNIQUE: Multiplanar, multiecho pulse sequences of the brain and surrounding structures were obtained without and with intravenous contrast. CONTRAST:  85m GADAVIST GADOBUTROL 1 MMOL/ML IV SOLN COMPARISON:  CT/CTA head and neck 1 day prior, brain MRI 09/02/2011. Report from an outside hospital brain MRI dated 04/09/2022 available in care everywhere. Images not available. FINDINGS: Brain: There is no evidence of acute intracranial hemorrhage, extra-axial fluid collection, or acute infarct. Postsurgical changes reflecting left-sided craniotomy for mass resection are noted. There is 4.5 cm AP x 3.5 cm TV resection cavity in the left temporal lobe with slight ex vacuo dilatation of the left temporal horn. There is expansile masslike FLAIR signal abnormality in the surrounding temporal lobe with involvement of the hippocampus extending to the insula with patchy areas of enhancement (12-13, 12-15, 12-16, 12-19). There is regional sulcal  effacement without midline shift. Otherwise, background parenchymal volume appears within normal limits. The ventricles are otherwise normal in size. Small foci of FLAIR signal abnormality in the remainder of the supratentorial white matter are nonspecific but likely reflects sequela of mild underlying chronic small-vessel ischemic change The pituitary and suprasellar region are normal. Vascular: Normal flow voids. Skull and upper cervical spine: Postsurgical changes as above. There is no suspicious marrow signal abnormality. Sinuses/Orbits: The paranasal sinuses are clear. Bilateral lens implants are in place. The globes and orbits are otherwise unremarkable. Other: None. IMPRESSION: 1. Postsurgical changes reflecting prior left temporal tumor resection with infiltrative FLAIR signal abnormality throughout much of the left temporal lobe extending to the insula and hippocampus with patchy nodular enhancement consistent with infiltrative tumor. Note that [place] are not available at the time of dictation for direct comparison. 2. No significant mass effect or midline shift. Electronically Signed   By: PValetta MoleM.D.   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 EEG to evaluate for seizure.  Level of alertness: Awake, asleep  AEDs during EEG study: Fosphenytoin, LEV, LCM  Technical aspects: This EEG study was  done with scalp electrodes positioned according to the 10-20 International system of electrode placement. Electrical activity was reviewed with band pass filter of 1-'70Hz'$ , sensitivity of 7 uV/mm, display speed of 82m/sec with a '60Hz'$  notched filter applied as appropriate. EEG data were recorded continuously and digitally stored.  Video monitoring was available and reviewed as appropriate.  Description: The posterior dominant rhythm consists of 8 Hz activity of moderate voltage (25-35 uV) seen predominantly in posterior head regions, asymmetric ( left<right) and reactive to eye opening and eye closing. Sleep was characterized by sleep spindles (12-'14hz'$ ) , maximal fronto-central region. EEG showed continuous 3 to 6 Hz theta-delta slowing in left hemisphere. Lateralized periodic discharges were alos noted in left hemisphwre at timex with overriding rhythmicity lasting 3 to 5 seconds consistent with brief ictal-interictal rhythmic discharges.  Hyperventilation and photic stimulation were not performed.   Seizure without clinical signs were noted on 09/22/2022 at 2022 and on 09/23/2022 at 0[place] 0634.  During the seizure, lateralized periodic discharges in left hemisphere evolved in morphology and frequency.  Average duration of seizures was about 45 seconds.  ABNORMALITY - Seizure without clinical signs, left hemisphere - Brief ictal-interictal rhythmic discharges, left hemisphere - Lateralized periodic discharges with overriding rhyhtmicity ( LPD +R) left hemisphere - background asymmetry, left<right - Continuous slow, left hemisphere  IMPRESSION: This study showed three seizures without clinical sign son 09/22/2022 at 2022 and on 09/23/2022 at 0351, 0634 lasting 45 seconds each. Additionally there is evidence of cortical dysfunction and epileptogenicity in left hemisphere with increased risk of seizures. 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 EEG to evaluate for seizure. Level of alertness: Awake AEDs during EEG study: Fosphenytoin Technical aspects: This EEG study was done with scalp electrodes positioned according to the 10-20 International system of electrode placement. Electrical activity was reviewed with band pass filter of 1-'70Hz'$ , sensitivity of 7 uV/mm, display speed of 350msec with a '60Hz'$  notched filter applied as appropriate. EEG data were recorded continuously and digitally stored.  Video monitoring was available and reviewed as appropriate. Description: The posterior dominant rhythm consists of 8 Hz activity of moderate voltage (25-35 uV) seen predominantly in posterior head regions, asymmetric ( left<right) and reactive to eye opening and eye closing. EEG showed continuous 3 to 6 Hz theta-delta slowing in left hemisphere. Lateralized periodic discharges were alos noted in left hemisphwre at timex with overriding rhythmicity. At 1301, EEG also showed evolution in morphology and frequency lasting 12 seconds consistent with seizure without  clinical signs twice. Hyperventilation and photic stimulation were not performed.   ABNORMALITY - Seizure without clinical signs, left hemisphere - Lateralized periodic discharges with overriding rhyhtmicity ( LPD +R) left hemisphere - background asymmetry, left<right - Continuous slow, left hemisphere IMPRESSION: This study showed two seizures without clinical signs at 1301 lasting 12 seconds each. Additionally there are lateralized periodic discharges with  overlying rhythmicity in left hemisphere which is on the ictal-interictal continuum and high risk of seizures. There is also cortical dysfunction in left hemisphere suggestive of underlying strcutural abnormality. Dr. Leonel Ramsay was notified. Priyanka Barbra Sarks   CT ANGIO HEAD NECK W WO CM (CODE STROKE)  Result Date: 09/22/2022 CLINICAL DATA:  New onset aphasia and unsteady gait. Abnormal CT of the head. EXAM: CT ANGIOGRAPHY HEAD AND NECK TECHNIQUE: Multidetector CT imaging of the head and neck was performed using the standard protocol during bolus administration of intravenous contrast. Multiplanar CT image reconstructions and MIPs were obtained to evaluate the vascular anatomy. Carotid stenosis measurements (when applicable) are obtained utilizing NASCET criteria, using the distal internal carotid diameter as the denominator. RADIATION DOSE REDUCTION: This exam was performed according to the departmental dose-optimization program which includes automated exposure control, adjustment of the mA and/or kV according to patient size and/or use of iterative reconstruction technique. CONTRAST:  37m OMNIPAQUE IOHEXOL 350 MG/ML SOLN COMPARISON:  CT head without contrast 09/22/2022. FINDINGS: CTA NECK FINDINGS Aortic arch: A 3 vessel arch configuration is present. No significant atherosclerotic change or stenosis is present. Right carotid system: The right common carotid artery is within normal limits. The bifurcation is normal. The cervical right ICA is within normal limits. Left carotid system: The left common carotid artery is within normal limits. The bifurcation is unremarkable. The cervical left ICA is normal. Vertebral arteries: The vertebral arteries are codominant. Both vertebral arteries originate from the subclavian arteries. No significant stenosis is present in either vertebral artery in the neck. Skeleton: Mild degenerative changes of the cervical spine are most pronounced at C3-4. No focal osseous lesions  are present. Other neck: Soft tissues the neck are otherwise unremarkable. Salivary glands are within normal limits. Thyroid is normal. No significant adenopathy is present. No focal mucosal or submucosal lesions are present. Upper chest: The lung apices are clear. The visualized upper abdomen is unremarkable. Review of the MIP images confirms the above findings CTA HEAD FINDINGS Anterior circulation: The internal carotid arteries are within normal limits from the skull base through the ICA termini. The A1 and M1 segments are normal. The anterior communicating artery is patent. The MCA bifurcations are within normal limits. The right MCA branch vessels and bilateral ACA branch vessels are normal. The left MCA branch vessels are decreased caliber compared to the right. No significant proximal stenosis or occlusion is present. Increased collateral vessels are present on the left. This may be secondary to treatment for the patient's known tumor. No aneurysm is present. Posterior circulation: The vertebral arteries are codominant. PICA origins are visualized and normal. The vertebrobasilar junction and basilar artery are normal. Both posterior cerebral arteries originate from basilar tip. The PCA branch vessels are within normal limits bilaterally. Venous sinuses: The dural sinuses are patent. The straight sinus and deep cerebral veins are intact. Cortical veins are within normal limits. No significant vascular malformation is evident. Anatomic variants: None Review of the MIP images confirms the above findings IMPRESSION: 1. Decreased caliber of the left MCA branch vessels compared to the right. No significant proximal stenosis or occlusion is present.  This is likely secondary to treatment for the patient's left-sided tumor. Increased collateral vessels are noted. 2. Normal appearance of the left M1 segment. No significant proximal stenosis, aneurysm or branch vessel occlusion within the Circle of Willis. 3. Normal CTA  of the neck. These results were called by telephone at the time of interpretation on 09/22/2022 at 10:50 am to provider [facility] , who verbally acknowledged these results. Electronically Signed   By: San Morelle M.D.   On: 09/22/2022 11:01   CT Head Wo Contrast  Result Date: 09/22/2022 CLINICAL DATA:  Mental status change. At 8:45 a.m. patient in having trouble speaking words and reading his phone. 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 EXAM: CT HEAD WITHOUT CONTRAST TECHNIQUE: Contiguous axial images were obtained from the base of the skull through the vertex without intravenous contrast. RADIATION DOSE REDUCTION: This exam was performed according to the departmental dose-optimization program which includes automated exposure control, adjustment of the mA and/or kV according to patient size and/or use of iterative reconstruction technique. COMPARISON:  MR head without and with contrast 09/02/2011. Report of MRI brain without and with contrast [facility] 04/09/2022 FINDINGS: Brain: Patient is status post left frontal craniotomy resection of tumor. There is diffuse loss of gray-white differentiation at the left temporal tip hypoattenuation extends into the inferior aspect of the insular cortex in the external capsule. Edema was described on the prior MRI at [facility]. The extent of the edema is unclear from the report. Subtle loss of gray-white differentiation is present in the left frontal operculum. Basal ganglia are intact. Mild subcortical white matter hypoattenuation is present in the right hemisphere. Loss of gray-white differentiation is present at the occipital poles bilaterally. No acute hemorrhage is present. Ex vacuo dilation of the left lateral ventricle is compatible with volume loss. The brainstem and cerebellum are within normal limits. 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 Vascular: A hyperdense left middle cerebral  artery present with some asymmetry into the left ICA terminus. Skull: Left frontal parietal craniotomy noted. Calvarium otherwise intact. No significant extracranial soft tissue lesion is present. Sinuses/Orbits: The paranasal sinuses and mastoid air cells are clear. Bilateral lens replacements are noted. Globes and orbits are otherwise unremarkable. IMPRESSION: 1. Status post resection of tumor in the left middle cranial fossa with surrounding edema. 2. 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 3. Subtle loss of gray-white differentiation in the left frontal operculum. 4. Loss of gray-white differentiation at the occipital poles bilaterally is concerning for additional infarcts. 5. Recommend CTA of the head and neck to assess vascular occlusion. These results were called by telephone at the time of interpretation on 09/22/2022 at 10:30 am to provider Dr. Rory Percy, who verbally acknowledged these results. Electronically Signed   By: San Morelle M.D.   On: 09/22/2022 10:30    Scheduled Meds:  phenytoin (DILANTIN) IV  100 mg Intravenous Q8H   Continuous Infusions:  sodium chloride 75 mL/hr (09/24/22 0244)   levETIRAcetam Stopped (09/23/22 2348)     LOS: 2 days   Time spent: 76mn  Ciel C Nitisha Civello, DO [facility]  If 7PM-7AM, please contact night-coverage [email or web address]  09/24/2022, 7:42 AM

## 2022-09-24 NOTE — Procedures (Addendum)
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 EEG to evaluate for seizure.   Level of alertness: Awake, asleep   AEDs during EEG study: Fosphenytoin, LEV, LCM   Technical aspects: This EEG study was done with scalp electrodes positioned according to the 10-20 International system of electrode placement. Electrical activity was reviewed with band pass filter of 1-'70Hz'$ , sensitivity of 7 uV/mm, display speed of 67m/sec with a '60Hz'$  notched filter applied as appropriate. EEG data were recorded continuously and digitally stored.  Video monitoring was available and reviewed as appropriate.   Description: The posterior dominant rhythm consists of 8 Hz activity of moderate voltage (25-35 uV) seen predominantly in posterior head regions, asymmetric ( left<right) and reactive to eye opening and eye closing. Sleep was characterized by sleep spindles (12-'14hz'$ ) , maximal fronto-central region. EEG showed continuous 3 to 6 Hz theta-delta slowing in left hemisphere. Lateralized periodic discharges were also noted in left hemisphwre at '1Hz'$ . At times LPD had overriding rhythmicity lasting 3 to 5 seconds consistent with brief ictal-interictal rhythmic discharges.  Hyperventilation and photic stimulation were not performed.      Seizure without clinical signs were [place] 09/24/2022 at 0638.  During the seizure, lateralized periodic discharges in left hemisphere evolved in morphology and frequency.  Average duration of seizures was about 1 minute 30  seconds.  Study was disconnected on 09/24/2022 between 1356 to 1442 while patient was being moved to a different room.  Additionally, study was disconnected between 1726 to 1907 for MRI.   ABNORMALITY - Seizure without clinical signs, left hemisphere - Brief ictal-interictal rhythmic discharges, left hemisphere - Lateralized periodic discharges with overriding rhyhtmicity ( LPD +R) left hemisphere - Background asymmetry, left<right - Continuous slow, left hemisphere   IMPRESSION: This study showed two seizures without clinical signs on on 09/23/2022 at [place] on 09/24/2022 at 0638 lasting about 1 minute 30 seconds. Additionally there is evidence of cortical dysfunction and epileptogenicity in left hemisphere with increased risk of seizures.     [name]

## 2022-09-24 NOTE — Evaluation (Signed)
Speech Language Pathology Evaluation Patient Details Name: Nathan Prince MRN: [id number] DOB: August 19, 1966 Today's Date: 09/24/2022 Time: 3546-5681 SLP Time Calculation (min) (ACUTE ONLY): 16 min  Problem List:  Patient Active Problem List   Diagnosis Date Noted   Stroke-like symptoms 09/22/2022   Seizure ([place]) 09/22/2022   Low testosterone 03/30/2013   Depression    Skin cancer    Radiation    Astrocytoma ([place]) 09/17/2011   Brain mass 09/06/2011   Past Medical History:  Past Medical History:  Diagnosis Date   Astrocytoma ([place]) 09/17/2011   subsequent oligodendroglioma in 2022   Depression    Low testosterone 03/30/2013   Radiation 11/04/11-12/19/11   Left temporal brain 59.4 gray in 33 fractions   Skin cancer 09/09/1986   Past Surgical History:  Past Surgical History:  Procedure Laterality Date   BACK SURGERY  1996   ruptured disc   CRANIOTOMY  09/04/2011   Procedure: CRANIOTOMY TUMOR EXCISION;  Surgeon: Floyce Stakes;  Location: [facility];  Service: Neurosurgery;  Laterality: Left;  Left Temporal craniectomy for tumor   MASS BIOPSY  09/05/2011   left temporal lobe   HPI:  Pt is 57 yo male who presented on 09/22/22 with stroke like symptoms found to have seizures.  Pt with hx of astrocytoma/oligodendroglioma treated with craniotomy/tumor excision  then radiation in 2013 and 2022, back surgery, depression.   Assessment / Plan / Recommendation Clinical Impression  Pt seen for speech-language-cognitive assessment with family at bedside. He is currently working as a Careers adviser. He did note some memory disturbances yesterday (forgetting if he told friend he was in the hospital but independently recalling that he did). He scored a 25/30 on the [place] indicating mild impairment in memory. He was able to store the 5 words but not retrieve however with prompts he recalled 5/5.  All other subtests were within normal range. Therapist discussed how he  recalls and anticipates prospective memory and he states he uses his phone and checks for notes he has made. Encouraged him to continue and use alarms on his phone if needed. He also states his wife helps him keep track at times. No further ST is needed at this time and pt/family are in agreement.    SLP Assessment  SLP Visit Diagnosis: Cognitive communication deficit (R41.841)    Recommendations for follow up therapy are one component of a multi-disciplinary discharge planning process, led by the attending physician.  Recommendations may be updated based on patient status, additional functional criteria and insurance authorization.    Follow Up Recommendations  No SLP follow up    Assistance Recommended at Discharge  None  Functional Status Assessment Patient has not had a recent decline in their functional status  Frequency and Duration           SLP Evaluation Cognition  Overall Cognitive Status: Within Functional Limits for tasks assessed Arousal/Alertness: Awake/alert Orientation Level: Oriented X4 Year: 2024 Day of Week: Correct Attention: Sustained Sustained Attention: Appears intact Memory: Impaired Memory Impairment: Retrieval deficit Awareness: Appears intact Problem Solving: Appears intact Safety/Judgment: Appears intact       Comprehension  Auditory Comprehension Overall Auditory Comprehension: Appears within functional limits for tasks assessed Visual Recognition/Discrimination Discrimination: Not tested Reading Comprehension Reading Status: Not tested    Expression Expression Primary Mode of Expression: Verbal Verbal Expression Overall Verbal Expression: Appears within functional limits for tasks assessed Naming: No impairment Pragmatics: No impairment Written Expression Dominant Hand: Right Written Expression: Not tested  Oral / Motor  Oral Motor/Sensory Function Overall Oral Motor/Sensory Function: Within functional limits Motor Speech Overall  Motor Speech: Appears within functional limits for tasks assessed Respiration: Within functional limits Phonation: Normal Resonance: Within functional limits Articulation: Within functional limitis Intelligibility: Intelligible Motor Planning: Witnin functional limits            Houston Siren 09/24/2022, 1:15 PM

## 2022-09-24 NOTE — Progress Notes (Addendum)
Subjective: had 2 subclinical seizures. Denies any concerns. Family including wife, daughter and son at bedside.  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  Imaging MRI brain with and without contrast 09/21/2022: Postsurgical changes reflecting prior left temporal tumor resection with infiltrative FLAIR signal abnormality throughout much of the left temporal lobe extending to the insula and hippocampus with patchy nodular enhancement consistent with infiltrative tumor. Note that the images from the prior brain MRI from [place] dated 04/09/2022 are not available at the time of dictation for direct comparison. No significant mass effect or midline shift.    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  Per wife, prior to presentation he was mixing up words at  times.  On arrival, EEG showed seizure without clinical signs and LPDs with overlying rhythmicity in left hemisphere.   Epilepsy with breakthrough seizure - Denies missing medications, recent sinusitis, Mild leucocytosis likely sec to seizure, UA bland -MRI brain with and without contrast shows nodular enhancement in the left temporal lobe, insula and hippocampus consistent with infiltrative tumor.  Spoke with Dr. Imagene Riches (oncologist) at [facility] who was able to compare his current MRI to the previous MRI done in August 2023.  According to her, MRI appears stable and the slight changes are most likely related to seizures.  Recommended continuing to manage seizures and seeing Dr. Ferdinand Lango next week  Recommendations -Continue Keppra to '2000mg'$  BID  -Start Vimpat 200 mg twice daily -Of note, patient was on Dilantin however with potential tumor recurrence, will switch to Vimpat to minimize any drug interactions in future any -If further seizures, can consider adding Onfi or perampanel - continue LTM eeg overnight - continue seizure precautions - PRN IV versed for clinical sz lasting over 2 minutes - discussed plan with patient, wife, daughter and son at bedside   I have spent a total of 36 minutes with the patient reviewing hospital notes,  test results, labs and examining the patient as well as establishing an assessment and plan that was discussed personally with the patient.  > 50% of time was spent in direct patient care.   Zeb Comfort Epilepsy [facility] For questions after 5pm please refer to AMION to reach the Neurologist on call

## 2022-09-24 NOTE — Evaluation (Signed)
Occupational Therapy Evaluation Patient Details Name: Nathan Prince MRN: [id number] DOB: [birth year] Today's Date: 09/24/2022   History of Present Illness Pt is 57 yo male who presented on 09/22/22 with stroke like symptoms found to have seizures.  Pt with hx of astrocytoma/oligodendroglioma treated with craniotomy/tumor excision  then radiation in 2013 and 2022, back surgery, depression.   Clinical Impression   PTA pt lives independently with his wife and works as a high school history Pharmacist, hospital. P/family feel that is is returning to baseline however "more sleepy" and increased difficulty noted with memory. Pt with mild unsteadiness most likely from not mobilizing. Educated wife on recommendation to provide HHA with mobility and ADL tasks - issued gait belt. Per wife, they expect a possible transfer to [facility] to follow up with his neurologist regarding his MRI results. Discussed need to further assess Nathan Prince's ability to return to work as he navigates his current medical situation. If he continues to have diffiuclty with memory or higher level executive skills, he can discuss follow up with outpt OT at that time as needed. Acute OT to follow.      Recommendations for follow up therapy are one component of a multi-disciplinary discharge planning process, led by the attending physician.  Recommendations may be updated based on patient status, additional functional criteria and insurance authorization.   Follow Up Recommendations  No OT follow up     Assistance Recommended at Discharge Frequent or constant Supervision/Assistance  Patient can return home with the following A little help with walking and/or transfers;A little help with bathing/dressing/bathroom;Assistance with cooking/housework;Direct supervision/assist for medications management;Direct supervision/assist for financial management;Assist for transportation;Help with stairs or ramp for entrance    Functional Status Assessment  Patient has  had a recent decline in their functional status and demonstrates the ability to make significant improvements in function in a reasonable and predictable amount of time.  Equipment Recommendations  None recommended by OT    Recommendations for Other Services       Precautions / Restrictions Precautions Precautions: Fall Restrictions Weight Bearing Restrictions: No      Mobility Bed Mobility Overal bed mobility: Needs Assistance Bed Mobility: Supine to Sit, Sit to Supine     Supine to sit: Supervision Sit to supine: Supervision   General bed mobility comments: for lines    Transfers Overall transfer level: Needs assistance Equipment used: None Transfers: Sit to/from Stand Sit to Stand: Min guard           General transfer comment: initially unsteady; used HHA; issued gait belt      Balance Overall balance assessment: Needs assistance Sitting-balance support: No upper extremity supported Sitting balance-Leahy Scale: Good     Standing balance support: No upper extremity supported Standing balance-Leahy Scale: Fair                             ADL either performed or assessed with clinical judgement   ADL Overall ADL's : Needs assistance/impaired                                     Functional mobility during ADLs: Min guard (HHA) General ADL Comments: REcommend direct assistnace with ADL tasks initially - wife verbalized undestanding; has a built in shower seat they can use     Vision Baseline Vision/History: 1 Wears glasses (readers) Patient Visual Report: No change from baseline  Vision Assessment?: Yes Eye Alignment: Within Functional Limits Ocular Range of Motion: Within Functional Limits Alignment/Gaze Preference: Within Defined Limits Tracking/Visual Pursuits: Able to track stimulus in all quads without difficulty Saccades: Within functional limits Convergence: Within functional limits Visual Fields: Other (comment) (R  superior quadrant cut at baseline since tumor resection)     Perception     Praxis      Pertinent Vitals/Pain Pain Assessment Pain Assessment: No/denies pain     Hand Dominance Right   Extremity/Trunk Assessment Upper Extremity Assessment Upper Extremity Assessment: Overall WFL for tasks assessed   Lower Extremity Assessment Lower Extremity Assessment: Defer to PT evaluation   Cervical / Trunk Assessment Cervical / Trunk Assessment: Normal   Communication Communication Communication: Other (comment) (pt had expressive language deficits prior to admission; pt/fmaily feel he is "much better")   Cognition Arousal/Alertness: Awake/alert Behavior During Therapy: WFL for tasks assessed/performed Overall Cognitive Status: Within Functional Limits for tasks assessed                                 General Comments: Wife reports feels that he is close to normal but seems "sleepy," slow processing;Feels that he is returning to baseline     General Comments       Exercises     Shoulder Instructions      Home Living Family/patient expects to be discharged to:: Private residence Living Arrangements: Spouse/significant other Available Help at Discharge: Family;Available PRN/intermittently Type of Home: House Home Access: Stairs to enter [employer] of Steps: 2; Pt also has 5-8 steps at work with Music therapist: None Home Layout: One level     Bathroom Shower/Tub: Occupational psychologist: Standard Bathroom Accessibility: Yes   Home Equipment: Civil engineer, contracting - built in      Lives With: Spouse    Prior Functioning/Environment Prior Level of Function : Independent/Modified Independent;Working/employed;Driving             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          OT Problem List: Decreased strength;Decreased activity tolerance;Impaired balance (sitting and/or standing);Decreased safety  awareness;Decreased knowledge of use of DME or AE      OT Treatment/Interventions: Self-care/ADL training;Therapeutic exercise;DME and/or AE instruction;Therapeutic activities;Cognitive remediation/compensation;Visual/perceptual remediation/compensation;Patient/family education;Balance training    OT Goals(Current goals can be found in the care plan section) Acute Rehab OT Goals Patient Stated Goal: to get better OT Goal Formulation: With patient/family Time For Goal Achievement: 10/08/22 Potential to Achieve Goals: Good  OT Frequency: Min 2X/week    Co-evaluation              AM-PAC OT "6 Clicks" Daily Activity     Outcome Measure Help from another person eating meals?: None Help from another person taking care of personal grooming?: A Little Help from another person toileting, which includes using toliet, bedpan, or urinal?: A Little Help from another person bathing (including washing, rinsing, drying)?: A Little Help from another person to put on and taking off regular upper body clothing?: A Little Help from another person to put on and taking off regular lower body clothing?: A Little 6 Click Score: 19   End of Session Equipment Utilized During Treatment: Gait belt Nurse Communication: Mobility status  Activity Tolerance: Patient tolerated treatment well Patient left: in bed;with call bell/phone within reach;with bed alarm set;with family/visitor present  OT Visit Diagnosis: Unsteadiness on feet (R26.81);Other abnormalities of  gait and mobility (R26.89);Other symptoms and signs involving cognitive function                Time: 1201-1222 OT Time Calculation (min): 21 min Charges:  OT General Charges $OT Visit: 1 Visit OT Evaluation $OT Eval Moderate Complexity: [place], OT/L   Acute OT Clinical Specialist Acute Rehabilitation Services Pager [phone] Office [phone]   [facility] 09/24/2022, 1:22 PM

## 2022-09-25 ENCOUNTER — Other Ambulatory Visit (HOSPITAL_COMMUNITY): Payer: Self-pay

## 2022-09-25 DIAGNOSIS — R569 Unspecified convulsions: Secondary | ICD-10-CM | POA: Diagnosis not present

## 2022-09-25 MED ORDER — PERAMPANEL 2 MG PO TABS
8.0000 mg | ORAL_TABLET | Freq: Once | ORAL | Status: AC
  Administered 2022-09-25: 8 mg via ORAL
  Filled 2022-09-25: qty 4

## 2022-09-25 NOTE — Plan of Care (Signed)
  Problem: Education: Goal: Knowledge of disease or condition will improve Outcome: Progressing Goal: Knowledge of secondary prevention will improve (MUST DOCUMENT ALL) Outcome: Progressing Goal: Knowledge of patient specific risk factors will improve Nathan Prince N/A or DELETE if not current risk factor) Outcome: Progressing   Problem: Ischemic Stroke/TIA Tissue Perfusion: Goal: Complications of ischemic stroke/TIA will be minimized Outcome: Progressing

## 2022-09-25 NOTE — Progress Notes (Addendum)
Subjective: Denies any concerns.  All family at bedside. 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  Per wife, prior to presentation he was mixing up words at times.  On arrival, EEG showed seizure without clinical signs and LPDs with overlying rhythmicity in left hemisphere.   Epilepsy with breakthrough seizure - Denies missing medications, recent sinusitis, Mild leucocytosis likely sec to seizure, UA bland -MRI brain with and without contrast shows nodular enhancement in the left temporal lobe,  insula and hippocampus consistent with infiltrative tumor.  Spoke with Dr. Imagene Riches (oncologist) at [facility] who was able to compare his current MRI to the previous MRI done in August 2023.  According to her, MRI appears stable and the slight changes are most likely related to seizures.  Recommended continuing to manage seizures and seeing Dr. Ferdinand Lango next week.  Neuro-oncology NP Olivia Mackie called on 09/25/2022 and said she will try to get patient an appointment for next Tuesday but if not possible she will find a different appointment date and let family know.  She also stated she will discuss the imaging with Dr. Ferdinand Lango.   Recommendations -Will give one-time dose of perampanel 8 mg as patient had 1 seizure overnight -Continue Keppra to '2000mg'$  BID, Vimpat 200 mg twice daily - continue LTM eeg overnight - continue seizure precautions - PRN IV versed for clinical sz lasting over 2 minutes - discussed plan with patient, wife, daughter and son at bedside   I have spent a total of 35 minutes with the patient reviewing hospital notes,  test results, labs and examining the patient as well as establishing an assessment and plan that was discussed personally with the patient.  > 50% of time was spent in direct patient care.   Zeb Comfort Epilepsy [facility] For questions after 5pm please refer to AMION to reach the Neurologist on call

## 2022-09-25 NOTE — Progress Notes (Signed)
   09/25/22 0930  Spiritual Encounters  Type of Visit Initial  Care provided to: Pt and family  Referral source Nurse (RN/NT/LPN)  Reason for visit Advance directives  OnCall Visit No   Chaplain responded to a spiritual consult for advanced directive education.  The patient, "Nathan Prince" and his family were present as we dicussed the Advanced Directive documents purpose and key points. Nathan Prince and his wife thought it would be best to work with the paperwork after discharge so that they have time to consider options and are operating with a clear mind.   Danice Goltz [facility]  [phone]

## 2022-09-25 NOTE — Progress Notes (Signed)
Performed maintenance on leads.  All under 10.  No skin breakdown.

## 2022-09-25 NOTE — Procedures (Addendum)
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 EEG to evaluate for seizure.   Level of alertness: Awake, asleep   AEDs during EEG study: LEV, LCM   Technical aspects: This EEG study was done with scalp electrodes positioned according to the 10-20 International system of electrode placement. Electrical activity was reviewed with band pass filter of 1-'70Hz'$ , sensitivity of 7 uV/mm, display speed of 69m/sec with a '60Hz'$  notched filter applied as appropriate. EEG data were recorded continuously and digitally stored.  Video monitoring was available and reviewed as appropriate.   Description: The posterior dominant rhythm consists of 8 Hz activity of moderate voltage (25-35 uV) seen predominantly in posterior head regions, asymmetric ( left<right) and reactive to eye opening and eye closing. Sleep was characterized by sleep spindles (12-'14hz'$ ) , maximal fronto-central region. EEG showed continuous 3 to 6 Hz theta-delta slowing in left hemisphere. Lateralized periodic discharges were also noted in left hemisphere at timex with overriding rhythmicity at 0.5-'1Hz'$ .   Seizure without clinical signs was noted on 09/25/2022 at 0100 and at 1009.  Of note, during the seizure at 1009, patient was noted to be talking to his family.  During the seizure, lateralized periodic discharges in left hemisphere evolved in morphology and frequency.  Duration of seizure was about 1 minute 30 seconds.   Study was disconnected on 09/24/2022 between 1356 to 1442 while patient was being moved to a  different room.  Additionally, study was disconnected between 1726 to 1907 for MRI.   ABNORMALITY - Seizure without clinical signs, left hemisphere - Lateralized periodic discharges with overriding rhyhtmicity ( LPD +R) left hemisphere - Background asymmetry, left<right - Continuous slow, left hemisphere   IMPRESSION: This study showed two seizures without clinical signs on 09/25/2022 at 0100 and 1009 lasting about 1 minute 30 seconds. Additionally there is evidence of cortical dysfunction and epileptogenicity in left hemisphere with increased risk of seizures.   Brynja Marker OBarbra Sarks

## 2022-09-25 NOTE — Progress Notes (Signed)
Physical Therapy Treatment Patient Details Name: Nathan Prince MRN: [id number] DOB: 1965/12/28 Today's Date: 09/25/2022   History of Present Illness Pt is 57 yo male who presented on 09/22/22 with stroke like symptoms found to have seizures.  Pt with hx of astrocytoma/oligodendroglioma treated with craniotomy/tumor excision  then radiation in 2013 and 2022, back surgery, depression.    PT Comments    Pt greeted supine in bed and eager for OOB mobility with continued progress towards acute mobility goals. Pt demonstrating ambulation in hall back and forth within constraints of continuous EEG cord at supervision level with no AD. Pt requiring up to min guard for higher level balance challenges in hall with no overt LOB noted, although pt demonstrating some general mild unsteadiness. Pt able to compete SLS dual tasking with counting backwards from 100 by 7s with accuracy. Pt continues to be limited by cognitive impairment, Often asking during session "did I already ask you this/that?". Anticipate safe discharge with assist outlined below once medically cleared, will follow acutely.    Recommendations for follow up therapy are one component of a multi-disciplinary discharge planning process, led by the attending physician.  Recommendations may be updated based on patient status, additional functional criteria and insurance authorization.  Follow Up Recommendations  No PT follow up     Assistance Recommended at Discharge PRN  Patient can return home with the following Assist for transportation;Help with stairs or ramp for entrance   Equipment Recommendations  None recommended by PT    Recommendations for Other Services       Precautions / Restrictions Precautions Precautions: Fall Restrictions Weight Bearing Restrictions: No     Mobility  Bed Mobility Overal bed mobility: Needs Assistance Bed Mobility: Supine to Sit, Sit to Supine     Supine to sit: Supervision Sit to supine:  Supervision   General bed mobility comments: for lines    Transfers Overall transfer level: Needs assistance Equipment used: None Transfers: Sit to/from Stand Sit to Stand: Min guard           General transfer comment: initially unsteady; used HHA;able to cpmplete x10 at end of session without UE support    Ambulation/Gait Ambulation/Gait assistance: Supervision, Min guard Gait Distance (Feet): 200 Feet Assistive device: None Gait Pattern/deviations: Step-through pattern Gait velocity: decreased but due to lines     General Gait Details: min guard for higher level balance challenges, back and forth in hall to extents of continuous EEG cord   Stairs             Wheelchair Mobility    Modified Rankin (Stroke Patients Only)       Balance Overall balance assessment: Needs assistance Sitting-balance support: No upper extremity supported Sitting balance-Leahy Scale: Good     Standing balance support: No upper extremity supported Standing balance-Leahy Scale: Fair               High level balance activites: Side stepping, Backward walking, Direction changes, Turns, Head turns High Level Balance Comments: pt with no LOB, some unsteadiness            Cognition Arousal/Alertness: Awake/alert Behavior During Therapy: WFL for tasks assessed/performed Overall Cognitive Status: Within Functional Limits for tasks assessed                                          Exercises Other Exercises Other Exercises: STS from  EOB at lowest height x10 without UE support    General Comments        Pertinent Vitals/Pain Pain Assessment Pain Assessment: No/denies pain    Home Living                          Prior Function            PT Goals (current goals can now be found in the care plan section) Acute Rehab PT Goals PT Goal Formulation: With patient/family Time For Goal Achievement: 10/07/22 Progress towards PT goals:  Progressing toward goals    Frequency    Min 3X/week      PT Plan      Co-evaluation              AM-PAC PT "6 Clicks" Mobility   Outcome Measure  Help needed turning from your back to your side while in a flat bed without using bedrails?: None Help needed moving from lying on your back to sitting on the side of a flat bed without using bedrails?: A Little Help needed moving to and from a bed to a chair (including a wheelchair)?: A Little Help needed standing up from a chair using your arms (e.g., wheelchair or bedside chair)?: A Little Help needed to walk in hospital room?: A Little Help needed climbing 3-5 steps with a railing? : A Little 6 Click Score: 19    End of Session Equipment Utilized During Treatment: Gait belt Activity Tolerance: Patient tolerated treatment well Patient left: in bed;with call bell/phone within reach;with bed alarm set;with family/visitor present Nurse Communication: Mobility status PT Visit Diagnosis: Unsteadiness on feet (R26.81)     Time: 5885-0277 PT Time Calculation (min) (ACUTE ONLY): 20 min  Charges:  $Therapeutic Activity: 8-22 mins                     Sharma Lawrance R. PTA Acute Rehabilitation Services Office: [facility] 09/25/2022, 10:34 AM

## 2022-09-25 NOTE — Progress Notes (Signed)
PROGRESS NOTE    Shadrick Senne  OZH:[id number] DOB: March 31, 1966 DOA: 09/22/2022 PCP: Orpah Melter, MD   Brief Narrative:  Nathan Prince is a 57 y.o. 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 He has a h/o atypical meningioma and astrocytoma that was treated with radiation in 2013 and again in 2022.  He had been clinically and radiographically stable and was last seen in 04/2022. 24h prior to admission noted to have word finding difficulties/difficulty reading with subsequent receptive/expressive aphasia and altered mental status/confusion/combative nature. [facility] called for admission, Neuro called in consult  Assessment & Plan:   Principal Problem:   Stroke-like symptoms Active Problems:   Astrocytoma (HCC)   Seizure ([place])  Acute metabolic encephalopathy Abnormal speech pattern Rule out seizure Stroke ruled out Chronic history of brain masses s/p radiation/surgery -Initial concern for CVA due to CT but seizures currently appear to be more likely -EEG abnormal -continues to show seizures overnight -MRI without acute process - initial concerns over expanding mass but reviewed by Neuro at [facility] and appears stable from prior imaging per discussion with their team. -Neurology consulting - appreciate insight/recs -Patient continues on Keppra and Vimpat, one-time dose perampanel '8mg'$  per neuro today -Primary neuro-oncology care is at [facility], will need outpatient neuro f/u there assuming he stabilizes  -PT/OT/ST, TOC teams following  DVT prophylaxis: Holding, low risk - early ambulation/OOB as tolerated Code Status: Full Family Communication: Daughter at bedside  Status is: Inpt  Dispo: The patient is from: Home              Anticipated d/c is to: Home              Anticipated d/c date is: 24-48h pending neuro clearance              Patient currently NOT medically stable for discharge given ongoing  seizures requiring medication management per neurology  Consultants:  Neuro  Procedures:  None  Antimicrobials:  None   Subjective: No acute issues/events overnight - more awake/alert/oriented this morning.  EEG continues to note seizure overnight  Objective: Vitals:   09/24/22 1538 09/24/22 2011 09/24/22 2324 09/25/22 0322  BP: 138/85 139/81 118/71 136/89  Pulse: 93 90 79 75  Resp: '15 17 17 16  '$ Temp: 98 F (36.7 C) 98.9 F (37.2 C) 97.7 F (36.5 C) 99 F (37.2 C)  TempSrc: Oral Oral Oral Oral  SpO2: 98% 95% 96% 99%  Weight:      Height:        Intake/Output Summary (Last 24 hours) at 09/25/2022 0727 Last data filed at 09/24/2022 2354 Gross per 24 hour  Intake 240 ml  Output --  Net 240 ml    Filed Weights   09/22/22 0938  Weight: 105.2 kg    Examination:  General:  Pleasantly resting in bed, No acute distress. HEENT:  Normocephalic atraumatic.  Sclerae nonicteric, noninjected.  Extraocular movements intact bilaterally. Neck:  Without mass or deformity.  Trachea is midline. Lungs:  Clear to auscultate bilaterally without rhonchi, wheeze, or rales. Heart:  Regular rate and rhythm.  Without murmurs, rubs, or gallops. Abdomen:  Soft, nontender, nondistended.  Without guarding or rebound. Extremities: Without cyanosis, clubbing, edema, or obvious deformity. Vascular:  Dorsalis pedis and posterior tibial pulses palpable bilaterally. Skin:  Warm and dry, no erythema, no ulcerations.  Data Reviewed: I have personally reviewed following labs and imaging studies  CBC: Recent Labs  Lab 09/22/22 0952  WBC 15.0*  NEUTROABS 11.6*  HGB 15.3  HCT 45.7  MCV 81.0  PLT 401    Basic Metabolic Panel: Recent Labs  Lab 09/22/22 0952  NA 131*  K 3.7  CL 96*  CO2 24  GLUCOSE 95  BUN 18  CREATININE 1.07  CALCIUM 9.0   GFR: Estimated Creatinine Clearance: 91.4 mL/min (by C-G formula based on SCr of 1.07 mg/dL).  Liver Function Tests: Recent Labs  Lab  09/22/22 0952  AST 27  ALT 30  ALKPHOS 75  BILITOT 0.5  PROT 7.4  ALBUMIN 4.2   HbA1C: Recent Labs    09/23/22 0403  HGBA1C 5.5   CBG: Recent Labs  Lab 09/22/22 0939 09/22/22 1200  GLUCAP 101* 108*   Lipid Profile: Recent Labs    09/23/22 0403  CHOL 139  HDL 35*  LDLCALC 80  TRIG 120  CHOLHDL 4.0    Thyroid Function Tests: No results for input(s): "TSH", "T4TOTAL", "FREET4", "T3FREE", "THYROIDAB" in the last 72 hours. Anemia Panel: No results for input(s): "VITAMINB12", "FOLATE", "FERRITIN", "TIBC", "IRON", "RETICCTPCT" in the last 72 hours. Sepsis Labs: No results for input(s): "PROCALCITON", "LATICACIDVEN" in the last 168 hours.  No results found for this or any previous visit (from the past 240 hour(s)).       Radiology Studies: MR Brain W and Wo Contrast  Result Date: 09/23/2022 CLINICAL DATA:  History of atypical meningioma astrocytoma treated with radiation in 2013 and 22 presents with stroke-like symptoms. EXAM: MRI HEAD WITHOUT AND WITH CONTRAST TECHNIQUE: Multiplanar, multiecho pulse sequences of the brain and surrounding structures were obtained without and with intravenous contrast. CONTRAST:  56m GADAVIST GADOBUTROL 1 MMOL/ML IV SOLN COMPARISON:  CT/CTA head and neck 1 day prior, brain MRI 09/02/2011. Report from an outside hospital brain MRI dated 04/09/2022 available in care everywhere. Images not available. FINDINGS: Brain: There is no evidence of acute intracranial hemorrhage, extra-axial fluid collection, or acute infarct. Postsurgical changes reflecting left-sided craniotomy for mass resection are noted. There is 4.5 cm AP x 3.5 cm TV resection cavity in the left temporal lobe with slight ex vacuo dilatation of the left temporal horn. There is expansile masslike FLAIR signal abnormality in the surrounding temporal lobe with involvement of the hippocampus extending to the insula with patchy areas of enhancement (12-13, 12-15, 12-16, 12-19). There is  regional sulcal effacement without midline shift. Otherwise, background parenchymal volume appears within normal limits. The ventricles are otherwise normal in size. Small foci of FLAIR signal abnormality in the remainder of the supratentorial white matter are nonspecific but likely reflects sequela of mild underlying chronic small-vessel ischemic change The pituitary and suprasellar region are normal. Vascular: Normal flow voids. Skull and upper cervical spine: Postsurgical changes as above. There is no suspicious marrow signal abnormality. Sinuses/Orbits: The paranasal sinuses are clear. Bilateral lens implants are in place. The globes and orbits are otherwise unremarkable. Other: None. IMPRESSION: 1. Postsurgical changes reflecting prior left temporal tumor resection with infiltrative FLAIR signal abnormality throughout much of the left temporal lobe extending to the insula and hippocampus with patchy nodular enhancement consistent with infiltrative tumor. Note that the [facility] are not available at the time of dictation for direct comparison. 2. No significant mass effect or midline shift. Electronically Signed   By: PValetta MoleM.D.   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 EEG to evaluate for seizure.  Level of alertness: Awake, asleep  AEDs during EEG study: Fosphenytoin, LEV, LCM  Technical aspects:  This EEG study was done with scalp electrodes positioned according to the 10-20 International system of electrode placement. Electrical activity was reviewed with band pass filter of 1-'70Hz'$ , sensitivity of 7 uV/mm, display speed of 54m/sec with a '60Hz'$  notched filter applied as appropriate. EEG data were recorded continuously and digitally stored.  Video monitoring was available and reviewed as appropriate.  Description: The posterior dominant rhythm consists of 8 Hz activity of moderate voltage (25-35 uV) seen predominantly in posterior head regions, asymmetric ( left<right) and reactive to eye opening and eye closing. Sleep was characterized by sleep spindles (12-'14hz'$ ) , maximal fronto-central region. EEG showed continuous 3 to 6 Hz theta-delta slowing in left hemisphere. Lateralized periodic discharges were alos noted in left hemisphwre at timex with overriding rhythmicity lasting 3 to 5 seconds consistent with brief ictal-interictal rhythmic discharges.  Hyperventilation and photic stimulation were not performed.   Seizure without clinical signs were noted on 09/22/2022 at 2022 and on 09/23/2022 at 0[place] 0634.  During the seizure, lateralized periodic discharges in left hemisphere evolved in morphology and frequency.  Average duration of seizures was about 45 seconds.  ABNORMALITY - Seizure without clinical signs, left hemisphere - Brief ictal-interictal rhythmic discharges, left hemisphere - Lateralized periodic discharges with overriding rhyhtmicity ( LPD +R) left hemisphere - background asymmetry, left<right - Continuous slow, left hemisphere  IMPRESSION: This study showed three seizures without clinical sign son 09/22/2022 at 2022 and on 09/23/2022 at 0351, 0634 lasting 45 seconds each. Additionally there is evidence of cortical dysfunction and epileptogenicity in left hemisphere with increased risk of seizures. Priyanka OBarbra Sarks   Scheduled Meds:  lacosamide  200 mg Oral BID   levETIRAcetam  2,000 mg Oral  BID   Continuous Infusions:     LOS: 3 days   Time spent: 481m  Cavan C Jud Fanguy, DO [facility]  If 7PM-7AM, please contact night-coverage [email or web address]  09/25/2022, 7:27 AM

## 2022-09-25 NOTE — TOC Benefit Eligibility Note (Signed)
Patient Teacher, English as a foreign language completed.    The patient is currently admitted and upon discharge could be taking Vimpat.  The current 30 day co-pay is $29.88.   The patient is insured through [employer]

## 2022-09-26 ENCOUNTER — Other Ambulatory Visit (HOSPITAL_COMMUNITY): Payer: Self-pay

## 2022-09-26 MED ORDER — LEVETIRACETAM 1000 MG PO TABS: 2000.0000 mg | ORAL_TABLET | Freq: Two times a day (BID) | ORAL | 2 refills | Status: DC

## 2022-09-26 MED ORDER — VALTOCO 20 MG DOSE 10 MG/0.1ML NA LQPK: 1.0000 | Freq: Every day | NASAL | 0 refills | Status: DC | PRN

## 2022-09-26 MED ORDER — PERAMPANEL 2 MG PO TABS: 2.0000 mg | ORAL_TABLET | Freq: Every day | ORAL | Status: DC

## 2022-09-26 MED ORDER — LACOSAMIDE 200 MG PO TABS: 200.0000 mg | ORAL_TABLET | Freq: Two times a day (BID) | ORAL | 2 refills | Status: DC

## 2022-09-26 MED ORDER — PERAMPANEL 2 MG PO TABS: 2.0000 mg | ORAL_TABLET | Freq: Every day | ORAL | 2 refills | Status: DC

## 2022-09-26 NOTE — Progress Notes (Signed)
LTM EEG discontinued - no skin breakdown at unhook.  

## 2022-09-26 NOTE — TOC Benefit Eligibility Note (Signed)
Patient Teacher, English as a foreign language completed.    The patient is currently admitted and upon discharge could be taking Valtoco 20 mg lqpk.  The current 30 day co-pay is $47.00.   The patient is currently admitted and upon discharge could be taking Fycompa 2 mg tablets.  The current 30 day co-pay is $47.00.   The patient is insured through [employer] [place] Patient Advocate Specialist [place] Patient Advocate Team Direct Number: [phone]  Fax: [phone]

## 2022-09-26 NOTE — Discharge Summary (Signed)
Physician Discharge Summary  Nathan Prince XHB:[id number] DOB: 28-Aug-1966 DOA: 09/22/2022  PCP: Orpah Melter, MD  Admit date: 09/22/2022 Discharge date: 09/26/2022  Admitted From: Home Disposition: Home  Recommendations for Outpatient Follow-up:  Follow up with PCP in 1-2 weeks Follow-up with neurology as scheduled  Home Health: None Equipment/Devices: None  Discharge Condition: Stable  CODE STATUS: Full Diet recommendation: Low-salt low-fat diet  Brief/Interim Summary: Nathan Prince is a 57 y.o. 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 He has a h/o atypical meningioma and astrocytoma that was treated with radiation in 2013 and again in 2022.  He had been clinically and radiographically stable and was last seen in 04/2022. 24h prior to admission noted to have word finding difficulties/difficulty reading with subsequent receptive/expressive aphasia and altered mental status/confusion/combative nature. TR[facility] called for admission, Neuro called in consult   Patient admitted as above with acute metabolic encephalopathy in the setting of acute recurrent breakthrough seizures.  Neurology consulted appreciate insight recommendations, multiple medications have been changed, as below including increased dose of Keppra, Vimpat as well as perampanel with intranasal Valtoco at discharge.  Patient seizures appear to have resolved with new regimen.  MRI imaging given his lengthy medication history was evaluated and reviewed with patient's primary neurologist at [facility] at which time they indicate there are no acute changes and no indication for transfer to their service at this time.  Given patient's resolution of seizures he is otherwise stable and agreeable for discharge home.  Close follow-up with neurology in 1 month as scheduled with either Dr. Delice Lesch or Dr. Jarome Lamas as discussed.   Discharge Diagnoses:  Principal  Problem:   Stroke-like symptoms Active Problems:   Astrocytoma ([place])   Seizure ([place])    Discharge Instructions   Allergies as of 09/26/2022   No Known Allergies      Medication List     TAKE these medications    lacosamide 200 MG Tabs tablet Commonly known as: VIMPAT Take 1 tablet (200 mg total) by mouth 2 (two) times daily.   levETIRAcetam 1000 MG tablet Commonly known as: KEPPRA Take 2 tablets (2,000 mg total) by mouth 2 (two) times daily. What changed:  medication strength how much to take   multivitamins ther. w/minerals Tabs tablet Take 1 tablet by mouth daily.   perampanel 2 MG tablet Commonly known as: FYCOMPA Take 1 tablet (2 mg total) by mouth at bedtime.   PREVAGEN PO Take 1 tablet by mouth daily.   Valtoco 20 MG Dose 2 x 10 MG/0.1ML Lqpk Generic drug: diazePAM (20 MG Dose) Place 1 spray into both nostrils daily as needed for up to 5 doses (for seizure lasting over 5 minutes).        No Known Allergies  Consultations: Neurology  Procedures/Studies: MR Brain W and Wo Contrast  Result Date: 09/23/2022 CLINICAL DATA:  History of atypical meningioma astrocytoma treated with radiation in 2013 and 22 presents with stroke-like symptoms. EXAM: MRI HEAD WITHOUT AND WITH CONTRAST TECHNIQUE: Multiplanar, multiecho pulse sequences of the brain and surrounding structures were obtained without and with intravenous contrast. CONTRAST:  21m GADAVIST GADOBUTROL 1 MMOL/ML IV SOLN COMPARISON:  CT/CTA head and neck 1 day prior, brain MRI 09/02/2011. Report from an outside hospital brain MRI dated 04/09/2022 available in care everywhere. Images not available. FINDINGS: Brain: There is no evidence of acute intracranial hemorrhage, extra-axial fluid collection, or acute infarct. Postsurgical changes reflecting left-sided craniotomy for mass resection are noted. There is  4.5 cm AP x 3.5 cm TV resection cavity in the left temporal lobe with slight ex vacuo dilatation of the  left temporal horn. There is expansile masslike FLAIR signal abnormality in the surrounding temporal lobe with involvement of the hippocampus extending to the insula with patchy areas of enhancement (12-13, 12-15, 12-16, 12-19). There is regional sulcal effacement without midline shift. Otherwise, background parenchymal volume appears within normal limits. The ventricles are otherwise normal in size. Small foci of FLAIR signal abnormality in the remainder of the supratentorial white matter are nonspecific but likely reflects sequela of mild underlying chronic small-vessel ischemic change The pituitary and suprasellar region are normal. Vascular: Normal flow voids. Skull and upper cervical spine: Postsurgical changes as above. There is no suspicious marrow signal abnormality. Sinuses/Orbits: The paranasal sinuses are clear. Bilateral lens implants are in place. The globes and orbits are otherwise unremarkable. Other: None. IMPRESSION: 1. Postsurgical changes reflecting prior left temporal tumor resection with infiltrative FLAIR signal abnormality throughout much of the left temporal lobe extending to the insula and hippocampus with patchy nodular enhancement consistent with infiltrative tumor. Note that the images from the prior brain MRI from [facility] dated 04/09/2022 are not available at the time of dictation for direct comparison. 2. No significant mass effect or midline shift. Electronically Signed   By: Valetta Mole M.D.   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 EEG to evaluate for seizure.  Level of alertness: Awake, asleep  AEDs during EEG study: Fosphenytoin, LEV, LCM  Technical aspects: This EEG study was done with scalp electrodes positioned according to the 10-20 International system of electrode placement. Electrical activity was reviewed with band pass filter of 1-'70Hz'$ , sensitivity of 7 uV/mm, display speed of 12m/sec with a '60Hz'$  notched filter applied as appropriate. EEG data were recorded continuously and digitally stored.  Video monitoring was available and reviewed as appropriate.  Description: The posterior dominant rhythm consists of 8 Hz activity of moderate voltage (25-35 uV) seen predominantly in posterior head regions, asymmetric ( left<right) and reactive to eye opening and eye closing. Sleep was characterized by sleep spindles (12-'14hz'$ ) , maximal fronto-central region. EEG showed continuous 3 to 6 Hz theta-delta slowing in left hemisphere. Lateralized periodic discharges at '1Hz'$  were alos noted in left hemisphwre at timex with overriding rhythmicity lasting 3 to 5 seconds consistent with brief ictal-interictal rhythmic discharges.  Hyperventilation and photic stimulation were not performed.   Seizure without clinical signs were noted on 09/22/2022 at 2022 and on 09/23/2022 at 0[place] 0634.  During the seizure, lateralized periodic discharges in left hemisphere evolved in morphology and frequency.  Average duration of seizures was about 45 seconds.  ABNORMALITY - Seizure without clinical signs, left hemisphere - Brief ictal-interictal rhythmic discharges, left hemisphere - Lateralized periodic discharges with overriding rhyhtmicity ( LPD +R) left hemisphere - background asymmetry, left<right - Continuous slow, left hemisphere  IMPRESSION: This study showed three seizures without clinical sign son 09/22/2022 at 2022 and on 09/23/2022  at 0351, 0634 lasting 45  seconds each. Additionally there is evidence of cortical dysfunction and epileptogenicity in left hemisphere with increased risk of seizures. 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 EEG to evaluate for seizure. Level of alertness: Awake AEDs during EEG study: Fosphenytoin Technical aspects: This EEG study was done with scalp electrodes positioned according to the 10-20 International system of electrode placement. Electrical activity was reviewed with band pass filter of 1-'70Hz'$ , sensitivity of 7 uV/mm, display speed of 68m/sec with a '60Hz'$  notched filter applied as appropriate. EEG data were recorded continuously and digitally stored.  Video monitoring was available and reviewed as appropriate. Description: The posterior dominant rhythm consists of 8 Hz activity of moderate voltage (25-35 uV) seen predominantly in posterior head regions, asymmetric ( left<right) and reactive to eye opening and eye closing. EEG showed continuous 3 to 6 Hz theta-delta slowing in left hemisphere. Lateralized periodic discharges were alos noted in left hemisphwre at timex with overriding rhythmicity. At 1301, EEG also showed evolution in morphology and frequency lasting 12 seconds consistent with seizure without clinical signs twice. Hyperventilation and photic stimulation were not performed.   ABNORMALITY - Seizure without clinical signs, left hemisphere - Lateralized periodic discharges with overriding  rhyhtmicity ( LPD +R) left hemisphere - background asymmetry, left<right - Continuous slow, left hemisphere IMPRESSION: This study showed two seizures without clinical signs at 1301 lasting 12 seconds each. Additionally there are lateralized periodic discharges with overlying rhythmicity in left hemisphere which is on the ictal-interictal continuum and high risk of seizures. There is also cortical dysfunction in left hemisphere suggestive of underlying strcutural abnormality. Dr. KLeonel Ramsaywas notified. Priyanka OBarbra Sarks  CT ANGIO HEAD NECK W WO CM (CODE STROKE)  Result Date: 09/22/2022 CLINICAL DATA:  New onset aphasia and unsteady gait. Abnormal CT of the head. EXAM: CT ANGIOGRAPHY HEAD AND NECK TECHNIQUE: Multidetector CT imaging of the head and neck was performed using the standard protocol during bolus administration of intravenous contrast. Multiplanar CT image reconstructions and MIPs were obtained to evaluate the vascular anatomy. Carotid stenosis measurements (when applicable) are obtained utilizing NASCET criteria, using the distal internal carotid diameter as the denominator. RADIATION DOSE REDUCTION: This exam was performed according to the departmental dose-optimization program which includes automated exposure control, adjustment of the mA and/or kV according to patient size and/or use of iterative reconstruction technique. CONTRAST:  818mOMNIPAQUE IOHEXOL 350 MG/ML SOLN COMPARISON:  CT head without contrast 09/22/2022. FINDINGS: CTA NECK FINDINGS Aortic arch: A 3 vessel arch configuration is present. No significant atherosclerotic change or stenosis is present. Right carotid system: The right common carotid artery is within normal limits. The bifurcation is normal. The cervical right ICA is within normal limits. Left carotid system: The left common carotid artery is within normal limits. The bifurcation is unremarkable. The cervical left ICA is normal. Vertebral arteries: The vertebral arteries  are codominant. Both vertebral arteries originate from the subclavian arteries. No significant stenosis is present in either vertebral artery in the  neck. Skeleton: Mild degenerative changes of the cervical spine are most pronounced at C3-4. No focal osseous lesions are present. Other neck: Soft tissues the neck are otherwise unremarkable. Salivary glands are within normal limits. Thyroid is normal. No significant adenopathy is present. No focal mucosal or submucosal lesions are present. Upper chest: The lung apices are clear. The visualized upper abdomen is unremarkable. Review of the MIP images confirms the above findings CTA HEAD FINDINGS Anterior circulation: The internal carotid arteries are within normal limits from the skull base through the ICA termini. The A1 and M1 segments are normal. The anterior communicating artery is patent. The MCA bifurcations are within normal limits. The right MCA branch vessels and bilateral ACA branch vessels are normal. The left MCA branch vessels are decreased caliber compared to the right. No significant proximal stenosis or occlusion is present. Increased collateral vessels are present on the left. This may be secondary to treatment for the patient's known tumor. No aneurysm is present. Posterior circulation: The vertebral arteries are codominant. PICA origins are visualized and normal. The vertebrobasilar junction and basilar artery are normal. Both posterior cerebral arteries originate from basilar tip. The PCA branch vessels are within normal limits bilaterally. Venous sinuses: The dural sinuses are patent. The straight sinus and deep cerebral veins are intact. Cortical veins are within normal limits. No significant vascular malformation is evident. Anatomic variants: None Review of the MIP images confirms the above findings IMPRESSION: 1. Decreased caliber of the left MCA branch vessels compared to the right. No significant proximal stenosis or occlusion is present. This  is likely secondary to treatment for the patient's left-sided tumor. Increased collateral vessels are noted. 2. Normal appearance of the left M1 segment. No significant proximal stenosis, aneurysm or branch vessel occlusion within the Circle of Willis. 3. Normal CTA of the neck. These results were called by telephone at the time of interpretation on 09/22/2022 at 10:50 am to provider [facility] , who verbally acknowledged these results. Electronically Signed   By: San Morelle M.D.   On: 09/22/2022 11:01   CT Head Wo Contrast  Result Date: 09/22/2022 CLINICAL DATA:  Mental status change. At 8:45 a.m. patient in having trouble speaking words and reading his phone. 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 EXAM: CT HEAD WITHOUT CONTRAST TECHNIQUE: Contiguous axial images were obtained from the base of the skull through the vertex without intravenous contrast. RADIATION DOSE REDUCTION: This exam was performed according to the departmental dose-optimization program which includes automated exposure control, adjustment of the mA and/or kV according to patient size and/or use of iterative reconstruction technique. COMPARISON:  MR head without and with contrast 09/02/2011. Report of MRI brain without and with contrast [facility] 04/09/2022 FINDINGS: Brain: Patient is status post left frontal craniotomy resection of tumor. There is diffuse loss of gray-white differentiation at the left temporal tip hypoattenuation extends into the inferior aspect of the insular cortex in the external capsule. Edema was described on the prior MRI at [facility]. The extent of the edema is unclear from the report. Subtle loss of gray-white differentiation is present in the left frontal operculum. Basal ganglia are intact. Mild subcortical white matter hypoattenuation is present in the right hemisphere. Loss of gray-white differentiation is present at the occipital poles  bilaterally. No acute hemorrhage is present. Ex vacuo dilation of the left lateral ventricle is compatible with volume loss. The brainstem and cerebellum are within normal limits. 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 Vascular: A hyperdense left middle cerebral artery present with some asymmetry into the left ICA terminus. Skull: Left frontal parietal craniotomy noted. Calvarium otherwise intact. No significant extracranial soft tissue lesion is present. Sinuses/Orbits: The paranasal sinuses and mastoid air cells are clear. Bilateral lens replacements are noted. Globes and orbits are otherwise unremarkable. IMPRESSION: 1. Status post resection of tumor in the left middle cranial fossa with surrounding edema. 2. 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 3. Subtle loss of gray-white differentiation in the left frontal operculum. 4. Loss of gray-white differentiation at the occipital poles bilaterally is concerning for additional infarcts. 5. Recommend CTA of the head and neck to assess vascular occlusion. These results were called by telephone at the time of interpretation on 09/22/2022 at 10:30 am to provider Dr. Rory Percy, who verbally acknowledged these results. Electronically Signed   By: San Morelle M.D.   On: 09/22/2022 10:30     Subjective: No acute issues or events overnight   Discharge Exam: Vitals:   09/26/22 0600 09/26/22 0743  BP:  130/84  Pulse:  81  Resp: 20 18  Temp:  97.8 F (36.6 C)  SpO2:  97%   Vitals:   09/26/22 0400 09/26/22 0500 09/26/22 0600 09/26/22 0743  BP:    130/84  Pulse:    81  Resp: (!) '23 19 20 18  '$ Temp:    97.8 F (36.6 C)  TempSrc:    Oral  SpO2:    97%  Weight:      Height:        General: Pt is alert, awake, not in acute distress Cardiovascular: RRR, S1/S2 +, no rubs, no gallops Respiratory: CTA bilaterally, no wheezing, no  rhonchi Abdominal: Soft, NT, ND, bowel sounds + Extremities: no edema, no cyanosis    The results of significant diagnostics from this hospitalization (including imaging, microbiology, ancillary and laboratory) are listed below for reference.     Microbiology: No results found for this or any previous visit (from the past 240 hour(s)).   Labs: BNP (last 3 results) No results for input(s): "BNP" in the last 8760 hours. Basic Metabolic Panel: Recent Labs  Lab 09/22/22 0952  NA 131*  K 3.7  CL 96*  CO2 24  GLUCOSE 95  BUN 18  CREATININE 1.07  CALCIUM 9.0   Liver Function Tests: Recent Labs  Lab 09/22/22 0952  AST 27  ALT 30  ALKPHOS 75  BILITOT 0.5  PROT 7.4  ALBUMIN 4.2   No results for input(s): "LIPASE", "AMYLASE" in the last 168 hours. No results for input(s): "AMMONIA" in the last 168 hours. CBC: Recent Labs  Lab 09/22/22 0952  WBC 15.0*  NEUTROABS 11.6*  HGB 15.3  HCT 45.7  MCV 81.0  PLT 270   Cardiac Enzymes: No results for input(s): "CKTOTAL", "CKMB", "CKMBINDEX", "TROPONINI" in the last 168 hours. BNP: Invalid input(s): "POCBNP" CBG: Recent Labs  Lab 09/22/22 0939 09/22/22 1200  GLUCAP 101* 108*   D-Dimer No results for input(s): "DDIMER" in the last 72 hours. Hgb A1c No results for input(s): "HGBA1C" in the last 72 hours. Lipid Profile No results for input(s): "CHOL", "HDL", "LDLCALC", "TRIG", "CHOLHDL", "LDLDIRECT" in the last 72 hours. Thyroid function studies No results for input(s): "TSH", "T4TOTAL", "T3FREE", "THYROIDAB" in the last 72 hours.  Invalid input(s): "FREET3" Anemia work up No results for input(s): "VITAMINB12", "FOLATE", "FERRITIN", "TIBC", "IRON", "RETICCTPCT"  in the last 72 hours. Urinalysis    Component Value Date/Time   COLORURINE YELLOW 09/22/2022 1125   APPEARANCEUR HAZY (A) 09/22/2022 1125   LABSPEC 1.015 09/22/2022 1125   PHURINE 7.5 09/22/2022 1125   GLUCOSEU NEGATIVE 09/22/2022 1125   HGBUR NEGATIVE  09/22/2022 1125   [place] 09/22/2022 1125   KETONESUR NEGATIVE 09/22/2022 1125   PROTEINUR NEGATIVE 09/22/2022 1125   NITRITE NEGATIVE 09/22/2022 1125   LEUKOCYTESUR NEGATIVE 09/22/2022 1125   Sepsis Labs Recent Labs  Lab 09/22/22 0952  WBC 15.0*   Microbiology No results found for this or any previous visit (from the past 240 hour(s)).   Time coordinating discharge: Over 30 minutes  SIGNED:   Little Ishikawa, DO [facility] 09/26/2022, 11:44 AM Pager   If 7PM-7AM, please contact night-coverage [email or web address]

## 2022-09-26 NOTE — TOC Transition Note (Signed)
Transition of Care [facility]) - CM/SW Discharge Note   Patient Details  Name: Nathan Prince MRN: [id number] Date of Birth: [birth year]  [facility] CM/SW Contact:  Verdell Carmine, RN Phone Number: 09/26/2022, 11:47 AM   Clinical Narrative:    Patient to be discharged today. Eligibility done by pharmacy for medications. Patient is insured. NO PT OT follow up recommended.  TOC signing off.    Final next level of care: Home/Self Care Barriers to Discharge: No Barriers Identified   Patient Goals and CMS Choice      Discharge Placement    Home Self care                     Discharge Plan and Services Additional resources added to the After Visit Summary for                                       Social Determinants of Health (SDOH) Interventions SDOH Screenings   Food Insecurity: No Food Insecurity (09/23/2022)  Housing: Low Risk  (09/23/2022)  Transportation Needs: No Transportation Needs (09/23/2022)  Utilities: Not At Risk (09/23/2022)  Tobacco Use: Low Risk  (09/22/2022)     Readmission Risk Interventions     No data to display

## 2022-09-26 NOTE — Progress Notes (Signed)
Subjective: No acute events overnight.  Denies any concerns.  Wife at bedside.  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  Per wife, prior to presentation he was mixing up words at times.  On arrival, EEG showed seizure without clinical signs and LPDs with overlying rhythmicity in left hemisphere.   Epilepsy with breakthrough seizure - Denies missing medications, recent sinusitis, Mild leucocytosis likely sec to seizure, UA bland -MRI brain with and without contrast shows nodular enhancement in the left temporal lobe, insula and hippocampus consistent with infiltrative tumor.  Spoke with Dr. Imagene Riches (oncologist) at [facility] who was able to compare his current MRI to the  previous MRI done in August 2023.  According to her, MRI appears stable and the slight changes are most likely related to seizures.  Recommended continuing to manage seizures and seeing Dr. Ferdinand Lango next week.  Neuro-oncology NP Olivia Mackie called on 09/25/2022 and said she will try to get patient an appointment for next Tuesday but if not possible she will find a different appointment date and let family know.  She also stated she will discuss the imaging with Dr. Ferdinand Lango.   Recommendations -Continue Keppra to '2000mg'$  BID, Vimpat 200 mg twice daily, perampanel 2 mg nightly -Rescue medication: Intranasal Valtoco 20 mg (10 mg in each nostril) for seizure lasting more than 5 minutes. -As no further seizures, okay to discontinue EEG - continue seizure precautions -Follow-up with neurology in 1 month  Seizure precautions: Per [facility] statutes, patients with seizures are not allowed to drive until they have been seizure-free for six months and cleared by a physician    Use caution when using heavy equipment or power tools. Avoid working on ladders or at heights. Take showers instead of baths. Ensure the water temperature is not too high on the home water heater. Do not go swimming alone. Do not lock yourself in a room alone (i.e. bathroom). When caring for infants or small children, sit down when holding, feeding, or changing them to minimize risk of injury to the child in the event you have a seizure. Maintain good sleep hygiene. Avoid alcohol.    If patient has another seizure,  call 911 and bring them back to the ED if: A.  The seizure lasts longer than 5 minutes.      B.  The patient doesn't wake shortly after the seizure or has new problems such as difficulty seeing, speaking or moving following the seizure C.  The patient was injured during the seizure D.  The patient has a temperature over 102 F (39C) E.  The patient vomited during the seizure and now is having trouble breathing    During the  Seizure   - First, ensure adequate ventilation and place patients on the floor on their left side  Loosen clothing around the neck and ensure the airway is patent. If the patient is clenching the teeth, do not force the mouth open with any object as this can cause severe damage - Remove all items from the surrounding that can be hazardous. The patient may be oblivious to what's happening and may not even know what he or she is doing. If the patient is confused and wandering, either gently guide him/her away and block access to outside areas - Reassure the individual and be comforting - Call 911. In most cases, the seizure ends before EMS arrives. However, there are cases when seizures may last over 3 to 5 minutes. Or the individual may have developed breathing difficulties or severe injuries. If a pregnant patient or a person with diabetes develops a seizure, it is prudent to call an ambulance. - Finally, if the patient does not regain full consciousness, then call EMS. Most patients will remain confused for about 45 to 90 minutes after a seizure, so you must use judgment in calling for help.   After the Seizure (Postictal Stage)   After a seizure, most patients experience confusion, fatigue, muscle pain and/or a headache. Thus, one should permit the individual to sleep. For the next few days, reassurance is essential. Being calm and helping reorient the person is also of importance.   Most seizures are painless and end spontaneously. Seizures are not harmful to others but can lead to complications such as stress on the lungs, brain and the heart. Individuals with prior lung problems may develop labored breathing and respiratory distress.    I have spent a total of 38 minutes with the patient reviewing hospital notes,  test results, labs and examining the patient as well as establishing an assessment and plan that was discussed personally with the patient.  > 50% of time was spent in direct patient  care.   Zeb Comfort Epilepsy [facility] For questions after 5pm please refer to AMION to reach the Neurologist on call

## 2022-09-26 NOTE — Progress Notes (Signed)
Mobility Specialist: Progress Note   09/26/22 1135  Mobility  Activity Ambulated with assistance in hallway  Level of Assistance Standby assist, set-up cues, supervision of patient - no hands on  Assistive Device None  Distance Ambulated (ft) 300 ft  Activity Response Tolerated well  Mobility Referral Yes  $Mobility charge 1 Mobility   Received pt in bed having no complaints and agreeable to mobility. Pt was asymptomatic throughout ambulation and returned to room w/o fault. Left sitting EOB w/ call bell in reach and all needs met.  Danielson Julliette Frentz Mobility Specialist Please contact via SecureChat or Rehab office at [phone]

## 2022-09-26 NOTE — Plan of Care (Signed)
  Problem: Education: Goal: Knowledge of disease or condition will improve Outcome: Adequate for Discharge Goal: Knowledge of secondary prevention will improve (MUST DOCUMENT ALL) Outcome: Adequate for Discharge Goal: Knowledge of patient specific risk factors will improve Elta [place] N/A or DELETE if not current risk factor) Outcome: Adequate for Discharge   Problem: Ischemic Stroke/TIA Tissue Perfusion: Goal: Complications of ischemic stroke/TIA will be minimized Outcome: Adequate for Discharge   Problem: Coping: Goal: Will verbalize positive feelings about self Outcome: Adequate for Discharge Goal: Will identify appropriate support needs Outcome: Adequate for Discharge   Problem: Health Behavior/Discharge Planning: Goal: Ability to manage health-related needs will improve Outcome: Adequate for Discharge Goal: Goals will be collaboratively established with patient/family Outcome: Adequate for Discharge   Problem: Self-Care: Goal: Ability to participate in self-care as condition permits will improve Outcome: Adequate for Discharge Goal: Verbalization of feelings and concerns over difficulty with self-care will improve Outcome: Adequate for Discharge Goal: Ability to communicate needs accurately will improve Outcome: Adequate for Discharge   Problem: Nutrition: Goal: Risk of aspiration will decrease Outcome: Adequate for Discharge Goal: Dietary intake will improve Outcome: Adequate for Discharge   Problem: Education: Goal: Knowledge of General Education information will improve Description: Including pain rating scale, medication(s)/side effects and non-pharmacologic comfort measures Outcome: Adequate for Discharge   Problem: Health Behavior/Discharge Planning: Goal: Ability to manage health-related needs will improve Outcome: Adequate for Discharge   Problem: Clinical Measurements: Goal: Ability to maintain clinical measurements within normal limits will improve Outcome:  Adequate for Discharge Goal: Will remain free from infection Outcome: Adequate for Discharge Goal: Diagnostic test results will improve Outcome: Adequate for Discharge Goal: Respiratory complications will improve Outcome: Adequate for Discharge Goal: Cardiovascular complication will be avoided Outcome: Adequate for Discharge   Problem: Activity: Goal: Risk for activity intolerance will decrease Outcome: Adequate for Discharge   Problem: Nutrition: Goal: Adequate nutrition will be maintained Outcome: Adequate for Discharge   Problem: Coping: Goal: Level of anxiety will decrease Outcome: Adequate for Discharge   Problem: Elimination: Goal: Will not experience complications related to bowel motility Outcome: Adequate for Discharge Goal: Will not experience complications related to urinary retention Outcome: Adequate for Discharge   Problem: Pain Managment: Goal: General experience of comfort will improve Outcome: Adequate for Discharge   Problem: Safety: Goal: Ability to remain free from injury will improve Outcome: Adequate for Discharge   Problem: Skin Integrity: Goal: Risk for impaired skin integrity will decrease Outcome: Adequate for Discharge

## 2022-09-26 NOTE — Progress Notes (Signed)
Discharge instructions given to the patient. °

## 2022-09-26 NOTE — Progress Notes (Signed)
Occupational Therapy Treatment Patient Details Name: Nathan Prince MRN: [id number] DOB: Jan 28, 1966 Today's Date: 09/26/2022   History of present illness Pt is 57 yo male who presented on 09/22/22 with stroke like symptoms found to have seizures.  Pt with hx of astrocytoma/oligodendroglioma treated with craniotomy/tumor excision  then radiation in 2013 and 2022, back surgery, depression.   OT comments  Pt continues to progress towards established OT goals. Performing pill box test with 1 error in 6 minutes. When inquiring about error, in regard to the instruction "take one every other day in the morning", pt reporting that he did not know whether to begin with Sunday or Monday, and thus, did not place one in either (remaining were correct), and pt did attempt to ask OT during testing. Pt observed with good use of compensatory strategies for memory including placing containers on opposite side once "pills" placed in pill box, re-reading instructions, and double checking his work. Pt's wife available to provide supervision when first returning to IADL. Pt performing STS transfers to challenge activity tolerance as well as marching in place/high knees. One minor LOB requiring min A to recover. Discussed potential for OP OT consult and pt unsure of decision as to whether he is interested at this time. Acute OT continue to follow.    Recommendations for follow up therapy are one component of a multi-disciplinary discharge planning process, led by the attending physician.  Recommendations may be updated based on patient status, additional functional criteria and insurance authorization.    Follow Up Recommendations  No OT follow up     Assistance Recommended at Discharge Frequent or constant Supervision/Assistance  Patient can return home with the following  A little help with walking and/or transfers;A little help with bathing/dressing/bathroom;Assistance with cooking/housework;Direct supervision/assist  for medications management;Direct supervision/assist for financial management;Assist for transportation;Help with stairs or ramp for entrance   Equipment Recommendations  None recommended by OT    Recommendations for Other Services      Precautions / Restrictions Precautions Precautions: Fall Restrictions Weight Bearing Restrictions: No       Mobility Bed Mobility Overal bed mobility: Needs Assistance Bed Mobility: Supine to Sit, Sit to Supine     Supine to sit: Supervision Sit to supine: Supervision   General bed mobility comments: for lines    Transfers Overall transfer level: Needs assistance Equipment used: None Transfers: Sit to/from Stand Sit to Stand: Min guard           General transfer comment: Initially slight unsteadiness. Able to perform 5x at end of session     Balance Overall balance assessment: Needs assistance Sitting-balance support: No upper extremity supported Sitting balance-Leahy Scale: Good     Standing balance support: No upper extremity supported Standing balance-Leahy Scale: Fair                             ADL either performed or assessed with clinical judgement   ADL Overall ADL's : Needs assistance/impaired                     Lower Body Dressing: Set up;Sitting/lateral leans Lower Body Dressing Details (indicate cue type and reason): to don socks             Functional mobility during ADLs: Min guard (continues to benefit from [facility])      Extremity/Trunk Assessment Upper Extremity Assessment Upper Extremity Assessment: Overall WFL for tasks assessed   Lower Extremity  Assessment Lower Extremity Assessment: Defer to PT evaluation        Vision   Additional Comments: R superior quadrant cut at baseline since tumor resection   Perception     Praxis      Cognition Arousal/Alertness: Awake/alert Behavior During Therapy: [facility] for tasks assessed/performed Overall Cognitive Status: Within  Functional Limits for tasks assessed                                 General Comments: Asking meaningful questions when perofrming pill box test; observed to approach in an organized manner (move already placed pill containers to the other side of his body, placing "pills" in logical order. Using compensatory techniques of talking himself through each instruction, and observed to double check his work.        Exercises Exercises: Other exercises Other Exercises Other Exercises: STS from EOB at lowest height x5 without UE support Other Exercises: marching in place/high knees standing EOB with min guard and HHA. One minor LOB requiring min A    Shoulder Instructions       General Comments VSS; HR max 118 with marching in place (high knees) 30 sec    Pertinent Vitals/ Pain       Pain Assessment Pain Assessment: No/denies pain  Home Living                                          Prior Functioning/Environment              Frequency  Min 2X/week        Progress Toward Goals  OT Goals(current goals can now be found in the care plan section)  Progress towards OT goals: Progressing toward goals  Acute Rehab OT Goals Patient Stated Goal: get out of hospital OT Goal Formulation: With patient/family Time For Goal Achievement: 10/08/22 Potential to Achieve Goals: Good ADL Goals Pt Will Transfer to Toilet: with modified independence;ambulating Additional ADL Goal #1: Pt/family will verbalie 3 strategies to reduce risk of falls Additional ADL Goal #2: complete 3 step trail making task in moderately distracting environement without redirectional cues  Plan Discharge plan remains appropriate;Frequency remains appropriate    Co-evaluation                 AM-PAC OT "6 Clicks" Daily Activity     Outcome Measure   Help from another person eating meals?: None Help from another person taking care of personal grooming?: A Little Help from  another person toileting, which includes using toliet, bedpan, or urinal?: A Little Help from another person bathing (including washing, rinsing, drying)?: A Little Help from another person to put on and taking off regular upper body clothing?: A Little Help from another person to put on and taking off regular lower body clothing?: A Little 6 Click Score: 19    End of Session Equipment Utilized During Treatment: Gait belt  OT Visit Diagnosis: Unsteadiness on feet (R26.81);Other abnormalities of gait and mobility (R26.89);Other symptoms and signs involving cognitive function   Activity Tolerance Patient tolerated treatment well   Patient Left in bed;with call bell/phone within reach;with bed alarm set;with family/visitor present   Nurse Communication Mobility status        Time: 1610-9604 OT Time Calculation (min): 47 min  Charges: OT General Charges $OT Visit: 1 Visit  OT Treatments $Self Care/Home Management : 23-37 mins $Therapeutic Activity: 8-22 mins  Elder Cyphers, OTR/L [facility] Acute Rehabilitation Office: [phone]   Magnus Ivan 09/26/2022, 11:23 AM

## 2022-09-26 NOTE — Procedures (Signed)
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 EEG to evaluate for seizure.   Level of alertness: Awake, asleep   AEDs during EEG study: LEV, LCM, Parampanel   Technical aspects: This EEG study was done with scalp electrodes positioned according to the 10-20 International system of electrode placement. Electrical activity was reviewed with band pass filter of 1-'70Hz'$ , sensitivity of 7 uV/mm, display speed of 5m/sec with a '60Hz'$  notched filter applied as appropriate. EEG data were recorded continuously and digitally stored.  Video monitoring was available and reviewed as appropriate.   Description: The posterior dominant rhythm consists of 8 Hz activity of moderate voltage (25-35 uV) seen predominantly in posterior head regions, asymmetric ( left<right) and reactive to eye opening and eye closing. Sleep was characterized by sleep spindles (12-'14hz'$ ) , maximal fronto-central region. EEG showed continuous 3 to 6 Hz theta-delta slowing in left hemisphere. Lateralized periodic discharges were also noted in left hemisphere at 0.5-'1Hz'$ , at times with overriding rhythmicity.    ABNORMALITY - Lateralized periodic discharges with overriding rhyhtmicity ( LPD +R) left hemisphere - Background asymmetry, left<right - Continuous slow, left hemisphere   IMPRESSION: This study showed evidence of cortical dysfunction and epileptogenicity in left hemisphere likely due to underlying structural abnormality and with increased risk of seizure recurrence. No definite seizures were  seen during this study.   Arieanna Pressey OBarbra Sarks

## 2022-09-26 NOTE — Progress Notes (Signed)
LTM maint complete - no skin breakdown Serviced leads impedance below 10kOhms [facility] monitored, Event button test confirmed by [facility].

## 2022-10-03 ENCOUNTER — Encounter: Payer: Self-pay | Admitting: Neurology

## 2022-10-03 NOTE — Progress Notes (Signed)
     Date: 10/03/2022   [name] [id number]   Subject: Return to work    To whom it may concern,    Mr Nathan Prince was admitted to [facility] for workup and was discharged in multiple new medications. These medications can cause excessive fatigue and require few days to adjust. Therefore, I would recommend he stay at home till Jan30. He cna resume work after that.   Please do not hesitate to contact me for any questions.     Dr Kara Dies [facility] [phone]

## 2022-10-15 ENCOUNTER — Ambulatory Visit: Payer: Self-pay | Admitting: Neurology

## 2022-10-28 ENCOUNTER — Encounter: Payer: Self-pay | Admitting: Neurology

## 2022-10-28 ENCOUNTER — Ambulatory Visit: Payer: BC Managed Care – PPO | Admitting: Neurology

## 2022-10-28 VITALS — BP 158/96 | HR 83 | Ht 70.0 in | Wt 235.0 lb

## 2022-10-28 DIAGNOSIS — G40119 Localization-related (focal) (partial) symptomatic epilepsy and epileptic syndromes with simple partial seizures, intractable, without status epilepticus: Secondary | ICD-10-CM | POA: Diagnosis not present

## 2022-10-28 DIAGNOSIS — C712 Malignant neoplasm of temporal lobe: Secondary | ICD-10-CM

## 2022-10-28 DIAGNOSIS — Z5181 Encounter for therapeutic drug level monitoring: Secondary | ICD-10-CM

## 2022-10-28 DIAGNOSIS — Z923 Personal history of irradiation: Secondary | ICD-10-CM

## 2022-10-28 MED ORDER — VALTOCO 20 MG DOSE 10 MG/0.1ML NA LQPK: 1.0000 | Freq: Every day | NASAL | 3 refills | Status: DC | PRN

## 2022-10-28 MED ORDER — DIVALPROEX SODIUM 500 MG PO DR TAB: 1000.0000 mg | DELAYED_RELEASE_TABLET | Freq: Two times a day (BID) | ORAL | 6 refills | Status: DC

## 2022-10-28 NOTE — Progress Notes (Signed)
[facility]  PATIENT: Nathan Prince DOB: Apr 18, 1966  REQUESTING CLINICIAN: Orpah Melter, MD HISTORY FROM: Patient, spouse and chart review  REASON FOR VISIT: Focal epilepsy   HISTORICAL  CHIEF COMPLAINT:  Chief Complaint  Patient presents with   New Patient (Initial Visit)    Rm 12. Accompanied by wife. NP/internal hospital referral for new onset seizures.    HISTORY OF PRESENT ILLNESS:  This is a 57 year old gentleman past medical history of left temporal astrocytoma/oligodendroglioma s/p resection, seizure disorder who is presenting after being discharged from the hospital with clusters of seizure.  Patient reports his diagnosis of astrocytoma/oligodendroglioma in 2013.  At that time he had 1 seizure in the setting of the diagnosis.  He was put on Keppra and he has done extremely well on Keppra 500 mg twice daily until January 12 of this year.  He reports he was at school and was having word finding difficulty.  At that time he noted also a sensation of GERD like symptoms, felt like he has indigestion and did not think anything of it.  On his way to church he continued to make paraphasic errors and one of the nurse at her church directed him to go to the hospital to rule out seizures.  In the hospital his initial EEG showed seizure without clinical signs and also LPD's.  He was admitted, his antiseizure medication optimized.  He continued to have nonconvulsive seizures, and upon discharge his Keppra was increased to 2000 mg twice daily, Vimpat was added 200 mg twice daily and 2 mg of Fycompa.  Since discharge, he denies any seizure or seizure-like activity but there is complaint of somnolence and emotional liability.  He feels more depressed and crying a lot which is not usual for him.  While in the hospital his MRI brain showed nodular enhancement in the left temporal lobe, insula, and hippocampus consistent with infiltrative tumor.  He is supposed to follow-up  with [facility] this week   Handedness: Right hand   Onset: Jan 12  Seizure Type: Focal aware seizure   Current frequency: Cluster starting Jan 12, last seizure Jan 17  Any injuries from seizures: Denies   Seizure risk factors: Astrocytoma, Oligodendroglioma s/p resection   Previous ASMs: Levetiracetam   Currenty ASMs: Levetiracetam 2000 mg twice daily, Lacosamide 200 mg twice daily, Perampanel 2 mg daily   ASMs side effects:  Depression, emotional liability drowsiness  Brain Images: FLAIR signal abnormality throughout much of the left temporal lobe extending to the insula and hippocampus with patchy nodular enhancement consistent with infiltrative tumor  Previous EEGs: Multiple seizures coming from the left hemisphere    OTHER MEDICAL CONDITIONS: Astrocytoma/Oligodendroglioma status post resection,   REVIEW OF SYSTEMS: Full 14 system review of systems performed and negative with exception of: As noted in the HPI  ALLERGIES: No Known Allergies  HOME MEDICATIONS: Outpatient Medications Prior to Visit  Medication Sig Dispense Refill   Apoaequorin (PREVAGEN PO) Take 1 tablet by mouth daily.     Calcium Carb-Cholecalciferol (CALCIUM 600/VITAMIN D3 PO) Take 1 tablet by mouth 2 (two) times daily.     lacosamide (VIMPAT) 200 MG TABS tablet Take 1 tablet (200 mg total) by mouth 2 (two) times daily. 60 tablet 2   levETIRAcetam (KEPPRA) 1000 MG tablet Take 2 tablets (2,000 mg total) by mouth 2 (two) times daily. 120 tablet 2   Multiple Vitamins-Minerals (MULTIVITAMINS THER. W/MINERALS) TABS Take 1 tablet by mouth daily.      diazePAM, 20  MG Dose, (VALTOCO 20 MG DOSE) 2 x 10 MG/0.1ML LQPK Place 1 spray into both nostrils daily as needed for up to 5 doses (for seizure lasting over 5 minutes). 1 each 0   perampanel (FYCOMPA) 2 MG tablet Take 1 tablet (2 mg total) by mouth at bedtime. 30 tablet 2   Facility-Administered Medications Prior to Visit  Medication Dose Route Frequency  Provider Last Rate Last Admin   topical emolient (BIAFINE) emulsion   Topical PRN Tyler Pita, MD   Given at 11/15/11 1401    PAST MEDICAL HISTORY: Past Medical History:  Diagnosis Date   Astrocytoma ([place]) 09/17/2011   subsequent oligodendroglioma in 2022   Depression    Low testosterone 03/30/2013   Radiation 11/04/11-12/19/11   Left temporal brain 59.4 gray in 33 fractions   Skin cancer 09/09/1986    PAST SURGICAL HISTORY: Past Surgical History:  Procedure Laterality Date   BACK SURGERY  1996   ruptured disc   CRANIOTOMY  09/04/2011   Procedure: CRANIOTOMY TUMOR EXCISION;  Surgeon: Floyce Stakes;  Location: [facility];  Service: Neurosurgery;  Laterality: Left;  Left Temporal craniectomy for tumor   MASS BIOPSY  09/05/2011   left temporal lobe    FAMILY HISTORY: History reviewed. No pertinent family history.  SOCIAL HISTORY: Social History   Socioeconomic History   Marital status: Married    Spouse name: Not on file   Number of children: Not on file   Years of education: Not on file   Highest education level: Not on file  Occupational History   Occupation: history teacher at [employer]  Tobacco Use   Smoking status: Never   Smokeless tobacco: Never   Tobacco comments:    never used tobacco  Substance and Sexual Activity   Alcohol use: No    Alcohol/week: 0.0 standard drinks of alcohol   Drug use: No   Sexual activity: Not on file  Other Topics Concern   Not on file  Social History Narrative   Not on file   Social Determinants of Health   Financial Resource Strain: Not on file  Food Insecurity: No Food Insecurity (09/23/2022)   Hunger Vital Sign    Worried About Running Out of Food in the Last Year: Never true    Ran Out of Food in the Last Year: Never true  Transportation Needs: No Transportation Needs (09/23/2022)   PRAPARE - Hydrologist (Medical): No    Lack of Transportation (Non-Medical): No  Physical  Activity: Not on file  Stress: Not on file  Social Connections: Not on file  Intimate Partner Violence: Not At Risk (09/24/2022)   Humiliation, Afraid, Rape, and Kick questionnaire    Fear of Current or Ex-Partner: No    Emotionally Abused: No    Physically Abused: No    Sexually Abused: No    PHYSICAL EXAM  GENERAL EXAM/CONSTITUTIONAL: Vitals:  Vitals:   10/28/22 0835  BP: (!) 158/96  Pulse: 83  Weight: 235 lb (106.6 kg)  Height: 5' 10"$  (1.778 m)   Body mass index is 33.72 kg/m. Wt Readings from Last 3 Encounters:  10/28/22 235 lb (106.6 kg)  09/22/22 232 lb (105.2 kg)  11/16/14 231 lb (104.8 kg)   Patient is in no distress; well developed, nourished and groomed; neck is supple  EYES: Visual fields full to confrontation, Extraocular movements intacts,  No results found.  MUSCULOSKELETAL: Gait, strength, tone, movements noted in Neurologic exam below  NEUROLOGIC: MENTAL STATUS:      No data to display         awake, alert, oriented to person, place and time recent and remote memory intact normal attention and concentration language fluent, comprehension intact, naming intact fund of knowledge appropriate  CRANIAL NERVE:  2nd, 3rd, 4th, 6th - Visual fields full to confrontation, extraocular muscles intact, no nystagmus 5th - facial sensation symmetric 7th - facial strength symmetric 8th - hearing intact 9th - palate elevates symmetrically, uvula midline 11th - shoulder shrug symmetric 12th - tongue protrusion midline  MOTOR:  normal bulk and tone, full strength in the BUE, BLE  SENSORY:  normal and symmetric to light touch  COORDINATION:  finger-nose-finger, fine finger movements normal  REFLEXES:  deep tendon reflexes present and symmetric  GAIT/STATION:  normal   DIAGNOSTIC DATA (LABS, IMAGING, TESTING) - I reviewed patient records, labs, notes, testing and imaging myself where available.  Lab Results  Component Value Date   WBC 15.0  (H) 09/22/2022   HGB 15.3 09/22/2022   HCT 45.7 09/22/2022   MCV 81.0 09/22/2022   PLT 270 09/22/2022      Component Value Date/Time   NA 131 (L) 09/22/2022 0952   NA 140 03/28/2015 0957   K 3.7 09/22/2022 0952   K 3.9 03/28/2015 0957   CL 96 (L) 09/22/2022 0952   CL 104 03/28/2015 0957   CO2 24 09/22/2022 0952   CO2 26 03/28/2015 0957   GLUCOSE 95 09/22/2022 0952   GLUCOSE 124 (H) 03/28/2015 0957   BUN 18 09/22/2022 0952   BUN 14 03/28/2015 0957   CREATININE 1.07 09/22/2022 0952   CREATININE 1.1 03/28/2015 0957   CALCIUM 9.0 09/22/2022 0952   CALCIUM 9.0 03/28/2015 0957   PROT 7.4 09/22/2022 0952   PROT 7.2 03/28/2015 0957   ALBUMIN 4.2 09/22/2022 0952   ALBUMIN 4.0 03/28/2015 0957   AST 27 09/22/2022 0952   AST 27 03/28/2015 0957   ALT 30 09/22/2022 0952   ALT 27 03/28/2015 0957   ALKPHOS 75 09/22/2022 0952   ALKPHOS 81 03/28/2015 0957   BILITOT 0.5 09/22/2022 0952   BILITOT 0.80 03/28/2015 0957   GFRNONAA >60 09/22/2022 0952   GFRAA >90 09/05/2011 0402   Lab Results  Component Value Date   CHOL 139 09/23/2022   HDL 35 (L) 09/23/2022   LDLCALC 80 09/23/2022   TRIG 120 09/23/2022   Lab Results  Component Value Date   HGBA1C 5.5 09/23/2022   Lab Results  Component Value Date   VITAMINB12 548 03/30/2013   Lab Results  Component Value Date   TSH 2.215 03/30/2013    MRI Brain 09/23/2022 1. Postsurgical changes reflecting prior left temporal tumor resection with infiltrative FLAIR signal abnormality throughout much of the left temporal lobe extending to the insula and hippocampus with patchy nodular enhancement consistent with infiltrative tumor. Note that the images from the prior brain MRI from [place] dated 04/09/2022 are not available at the time of dictation for direct comparison. 2. No significant mass effect or midline shift.   EEG 09/22/2022 - Seizure without clinical signs, left hemisphere - Lateralized periodic discharges with overriding rhyhtmicity  ( LPD +R) left hemisphere - background asymmetry, left<right - Continuous slow, left hemisphere  EEG 09/23/2022 - Seizure without clinical signs, left hemisphere - Brief ictal-interictal rhythmic discharges, left hemisphere - Lateralized periodic discharges with overriding rhyhtmicity ( LPD +R) left hemisphere - background asymmetry, left<right - Continuous slow, left hemisphere  EEG 09/24/2022 -  Seizure without clinical signs, left hemisphere - Brief ictal-interictal rhythmic discharges, left hemisphere - Lateralized periodic discharges with overriding rhyhtmicity ( LPD +R) left hemisphere - Background asymmetry, left<right - Continuous slow, left hemisphere   EEG 09/25/2022 - Lateralized periodic discharges with overriding rhyhtmicity ( LPD +R) left hemisphere - Background asymmetry, left<right - Continuous slow, left hemisphere   I personally reviewed brain Images and previous EEG reports.   ASSESSMENT AND PLAN  57 y.o. year old male  with history of astrocytoma/oligodendroglioma in the left temporal region s/p resection and radiation therapy in 2013 who is presenting with breakthrough seizures.  On admission his EEG showed seizures with LPD's and upon discharge no additional seizures but he has LPD's in the left hemisphere.  He is currently on Keppra 2 g twice daily, Vimpat 200 mg twice daily and Fycompa 2 mg daily.  He does report depression, crying spell but no seizures.  I do believe the side effect is from the Fycompa. I will switch the Fycompa to Depakote 1000 mg twice daily.  I have advised patient and wife to contact me if he has a breakthrough seizure.  I also gave them refill on the Valtoco to use as needed.  If he continues to have breakthrough seizure first will be to increase the Depakote to 2000 mg twice daily and add a fourth agent, possibly clobazam.  I also given them information about VNS therapy in the case the seizures become intractable.  I will see him in 6 weeks  for follow-up   1. Partial symptomatic epilepsy with simple partial seizures, intractable, without status epilepticus ([place])   2. Therapeutic drug monitoring   3. Malignant neoplasm of temporal lobe (HCC)   4. Status post radiation therapy     Patient Instructions  Continue with Keppra 2 g twice daily Continue with Vimpat 200 mg twice daily  Discontinue Fycompa Start with Depakote 1000 mg twice daily Use Valtoco as needed for seizure lasting more than 5 minutes or for cluster of seizures Follow-up with [facility] as scheduled Return in 6 weeks or sooner if worse.   Per [facility] statutes, patients with seizures are not allowed to drive until they have been seizure-free for six months.  Other recommendations include using caution when using heavy equipment or power tools. Avoid working on ladders or at heights. Take showers instead of baths.  Do not swim alone.  Ensure the water temperature is not too high on the home water heater. Do not go swimming alone. Do not lock yourself in a room alone (i.e. bathroom). When caring for infants or small children, sit down when holding, feeding, or changing them to minimize risk of injury to the child in the event you have a seizure. Maintain good sleep hygiene. Avoid alcohol.  Also recommend adequate sleep, hydration, good diet and minimize stress.   During the Seizure  - First, ensure adequate ventilation and place patients on the floor on their left side  Loosen clothing around the neck and ensure the airway is patent. If the patient is clenching the teeth, do not force the mouth open with any object as this can cause severe damage - Remove all items from the surrounding that can be hazardous. The patient may be oblivious to what's happening and may not even know what he or she is doing. If the patient is confused and wandering, either gently guide him/her away and block access to outside areas - Reassure the individual and be  comforting -  Call 911. In most cases, the seizure ends before EMS arrives. However, there are cases when seizures may last over 3 to 5 minutes. Or the individual may have developed breathing difficulties or severe injuries. If a pregnant patient or a person with diabetes develops a seizure, it is prudent to call an ambulance. - Finally, if the patient does not regain full consciousness, then call EMS. Most patients will remain confused for about 45 to 90 minutes after a seizure, so you must use judgment in calling for help. - Avoid restraints but make sure the patient is in a bed with padded side rails - Place the individual in a lateral position with the neck slightly flexed; this will help the saliva drain from the mouth and prevent the tongue from falling backward - Remove all nearby furniture and other hazards from the area - Provide verbal assurance as the individual is regaining consciousness - Provide the patient with privacy if possible - Call for help and start treatment as ordered by the caregiver   After the Seizure (Postictal Stage)  After a seizure, most patients experience confusion, fatigue, muscle pain and/or a headache. Thus, one should permit the individual to sleep. For the next few days, reassurance is essential. Being calm and helping reorient the person is also of importance.  Most seizures are painless and end spontaneously. Seizures are not harmful to others but can lead to complications such as stress on the lungs, brain and the heart. Individuals with prior lung problems may develop labored breathing and respiratory distress.     Orders Placed This Encounter  Procedures   Lacosamide   Levetiracetam level   CMP    Meds ordered this encounter  Medications   divalproex (DEPAKOTE) 500 MG DR tablet    Sig: Take 2 tablets (1,000 mg total) by mouth 2 (two) times daily.    Dispense:  120 tablet    Refill:  6   diazePAM, 20 MG Dose, (VALTOCO 20 MG DOSE) 2 x 10 MG/0.1ML  LQPK    Sig: Place 1 spray into both nostrils daily as needed for up to 5 doses (for seizure lasting over 5 minutes).    Dispense:  2 each    Refill:  3    Please provide patient 2 dose, one for school and one for home.    Return in about 6 weeks (around 12/09/2022).  I have spent a total of 85 minutes dedicated to this patient today, preparing to see patient, performing a medically appropriate examination and evaluation, ordering tests and/or medications and procedures, and counseling and educating the patient/family/caregiver; independently interpreting result and communicating results to the family/patient/caregiver; and documenting clinical information in the electronic medical record.   Alric Ran, MD 10/28/2022, 5:07 PM  [facility] [address], [place], [place] [address] [phone]

## 2022-10-28 NOTE — Patient Instructions (Signed)
Continue with Keppra 2 g twice daily Continue with Vimpat 200 mg twice daily  Discontinue Fycompa Start with Depakote 1000 mg twice daily Use Valtoco as needed for seizure lasting more than 5 minutes or for cluster of seizures Follow-up with [facility] as scheduled Return in 6 weeks or sooner if worse.

## 2022-10-31 ENCOUNTER — Ambulatory Visit
Admission: RE | Admit: 2022-10-31 | Discharge: 2022-10-31 | Disposition: A | Discharge status: Home / Self Care | Payer: Self-pay | Source: Ambulatory Visit | Attending: Internal Medicine | Admitting: Internal Medicine

## 2022-10-31 ENCOUNTER — Telehealth: Payer: Self-pay | Admitting: Internal Medicine

## 2022-10-31 ENCOUNTER — Encounter: Payer: Self-pay | Admitting: *Deleted

## 2022-10-31 ENCOUNTER — Other Ambulatory Visit: Payer: Self-pay | Admitting: *Deleted

## 2022-10-31 DIAGNOSIS — C719 Malignant neoplasm of brain, unspecified: Secondary | ICD-10-CM

## 2022-10-31 LAB — COMPREHENSIVE METABOLIC PANEL
ALT: 21 IU/L (ref 0–44)
AST: 19 IU/L (ref 0–40)
Albumin/Globulin Ratio: 1.9 (ref 1.2–2.2)
Albumin: 4.5 g/dL (ref 3.8–4.9)
Alkaline Phosphatase: 89 IU/L (ref 44–121)
BUN/Creatinine Ratio: 18 (ref 9–20)
BUN: 16 mg/dL (ref 6–24)
Bilirubin Total: 0.2 mg/dL (ref 0.0–1.2)
CO2: 21 mmol/L (ref 20–29)
Calcium: 9.7 mg/dL (ref 8.7–10.2)
Chloride: 104 mmol/L (ref 96–106)
Creatinine, Ser: 0.87 mg/dL (ref 0.76–1.27)
Globulin, Total: 2.4 g/dL (ref 1.5–4.5)
Glucose: 91 mg/dL (ref 70–99)
Potassium: 4.4 mmol/L (ref 3.5–5.2)
Sodium: 141 mmol/L (ref 134–144)
Total Protein: 6.9 g/dL (ref 6.0–8.5)
eGFR: 100 mL/min/{1.73_m2} (ref >59)

## 2022-10-31 LAB — LEVETIRACETAM LEVEL: Levetiracetam Lvl: 57.5 ug/mL — ABNORMAL HIGH (ref 10.0–40.0)

## 2022-10-31 LAB — LACOSAMIDE: Lacosamide: 14.5 ug/mL — ABNORMAL HIGH (ref 5.0–10.0)

## 2022-10-31 NOTE — Telephone Encounter (Signed)
Scheduled appt per 2/22 staff msg. Pt's wife is aware of appt date and time. Pt's wife is aware to arrive 15 mins prior to appt time and to bring and updated insurance card. Pt's wife is aware of appt location.

## 2022-10-31 NOTE — Progress Notes (Signed)
Requested scans from [facility]

## 2022-10-31 NOTE — Progress Notes (Signed)
Received referral from Dr Encarnacion Slates, MD at [facility].    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    MRI 09/11/2021, 09/23/2022 and 10/29/2022 requested from [facility].  Requested New patient scheduler reach out to patient to get scheduled.

## 2022-11-04 ENCOUNTER — Other Ambulatory Visit: Payer: Self-pay

## 2022-11-04 ENCOUNTER — Encounter: Payer: Self-pay | Admitting: Internal Medicine

## 2022-11-04 ENCOUNTER — Other Ambulatory Visit (HOSPITAL_COMMUNITY): Payer: Self-pay

## 2022-11-04 ENCOUNTER — Inpatient Hospital Stay: Payer: BC Managed Care – PPO | Attending: Internal Medicine | Admitting: Internal Medicine

## 2022-11-04 VITALS — BP 150/92 | HR 94 | Temp 97.8°F | Resp 14 | Wt 235.1 lb

## 2022-11-04 DIAGNOSIS — Z9221 Personal history of antineoplastic chemotherapy: Secondary | ICD-10-CM | POA: Diagnosis not present

## 2022-11-04 DIAGNOSIS — Z923 Personal history of irradiation: Secondary | ICD-10-CM | POA: Insufficient documentation

## 2022-11-04 DIAGNOSIS — Z85828 Personal history of other malignant neoplasm of skin: Secondary | ICD-10-CM | POA: Insufficient documentation

## 2022-11-04 DIAGNOSIS — C719 Malignant neoplasm of brain, unspecified: Secondary | ICD-10-CM | POA: Insufficient documentation

## 2022-11-04 DIAGNOSIS — Z79899 Other long term (current) drug therapy: Secondary | ICD-10-CM | POA: Insufficient documentation

## 2022-11-04 DIAGNOSIS — R569 Unspecified convulsions: Secondary | ICD-10-CM | POA: Diagnosis not present

## 2022-11-04 MED ORDER — DIVALPROEX SODIUM 500 MG PO DR TAB: 500.0000 mg | DELAYED_RELEASE_TABLET | Freq: Two times a day (BID) | ORAL | 0 refills | Status: DC

## 2022-11-04 MED ORDER — TIBSOVO 250 MG PO TABS
500.0000 mg | ORAL_TABLET | Freq: Every day | ORAL | 1 refills | Status: DC
  Filled 2022-11-04 – 2022-11-15 (×2): qty 60, 30d supply, fill #0
  Filled 2022-12-05: qty 60, 30d supply, fill #1

## 2022-11-04 NOTE — Progress Notes (Signed)
[facility] at [place], [place] [address] [phone]   New Patient Evaluation  Date of Service: 11/04/22 Patient Name: Nathan Prince Patient MRN: [id number] Patient DOB: Dec 10, 1965 Provider: Ventura Sellers, MD  Identifying Statement:  Nathan Prince is a 57 y.o. male with left temporal anaplastic astrocytoma who presents for initial consultation and evaluation.    Referring Provider: Orpah Melter, MD [address] 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 Pathology: astrocytoma (WHO Grade II) per patient report.  10/02/2011 Surgery Craniotomy for gross total resection performed by Derrill Memo, MD. Pathology: anaplastic oligoastrocytoma (WHO Grade III).  - 12/31/2011 Radiation Radiographically stable after conclusion of radiation therapy with concurrent temozolomide 75 mg/m2 daily. Recommended proceeding with five day temozolomide, dosed at 150 mg/m2 for the first cycle and escalated to 200 mg/m2 for subsequent cycles if well tolerated.  01/01/2012 - 10/27/2012 Chemotherapy Eleven cycles of five day temozolomide. Hypometabolic PET scan. Recommended discontinuing temozolomide. Serial MRI monitoring initiated.  02/27/2021 Clinical Event-Other MRI on 02/27/21 reveals new very small area of enhancement medial to resection cavity best seen on axials, concerning for possible disease progression; note made of new left frontal enhancing lesion, which was not present on the 04/11/20 scan.  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 Derrill Memo for resection of tumor.  03/27/2021 Radiology Study MRI brain 4 week follow up scan shows growth of the left frontal lesion. Increased left frontal convexity enhancing dural based lesion measuring 1.9 cm AP by 1.0 cm TV by 2.0 cm CC (1.4 x 0.7 x 1.8 cm on 02/27/2021 exam). Recommend resection with Dr. Tommi Rumps.  07/31/2021 Radiology Study MRI post radiation for left frontal meningioma and for left temporal oligoastrocytoma surveillance. These two sites are stable. Small enhancing focus medial to left temporal resection cavity is apparent on today's scan comparable to July 2022. Will follow up with a short term MRI in another 6 weeks.  Atypical meningioma of brain (CMS-HCC) 04/20/2021 Initial Diagnosis Atypical meningioma of brain (CMS-HCC)  04/20/2021 Surgery Resection of left frontal convexity mass with Derrill Memo. Pathology: atypical meningioma, WHO grade II. TERT negative.  05/31/2021 - 07/16/2021 Radiation IMRT to the left frontal convexity resected typical meningioma total dose 57.6 Gy  07/31/2021 Radiology Study MRI post radiation for left frontal meningioma and for left temporal oligoastrocytoma surveillance. These two sites are stable. Small enhancing focus medial to left temporal resection cavity is apparent on today's scan comparable to July 2022. Will follow up with a short term MRI in another 6 weeks.   Biomarkers:  MGMT Unknown.  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  EGFR Unknown  TERT Unknown   History of Present Illness: The patient's records from the referring physician were obtained and reviewed and the patient interviewed to confirm this HPI.  Nathan Prince presents today to establish care for local treatment of progressive grade 3  astrocytoma,  IDHmt.  He and his wife describe decline in function, energy, cognition, language expression since his hospitalization in January for status epilepticus.  He is still teaching full time, and is having difficulty in class with communication and overall energy.  Currently dosing Keppra '2000mg'$  BID, Vimpat '200mg'$  BID, and Depakote '1000mg'$  BID.  Not on any steroids.  He will be proceeding with Tibsovo therapy per [facility] team recommendations.  Medications: Current Outpatient Medications on File Prior to Visit  Medication Sig Dispense Refill   Apoaequorin (PREVAGEN PO) Take 1 tablet by mouth daily.     Calcium Carb-Cholecalciferol (CALCIUM 600/VITAMIN D3 PO) Take 1 tablet by mouth 2 (two) times daily.     diazePAM, 20 MG Dose, (VALTOCO 20 MG DOSE) 2 x 10 MG/0.1ML LQPK Place 1 spray into both nostrils daily as needed for up to 5 doses (for seizure lasting over 5 minutes). 2 each 3   divalproex (DEPAKOTE) 500 MG DR tablet Take 2 tablets (1,000 mg total) by mouth 2 (two) times daily. 120 tablet 6   lacosamide (VIMPAT) 200 MG TABS tablet Take 1 tablet (200 mg total) by mouth 2 (two) times daily. 60 tablet 2   levETIRAcetam (KEPPRA) 1000 MG tablet Take 2 tablets (2,000 mg total) by mouth 2 (two) times daily. 120 tablet 2   Multiple Vitamins-Minerals (MULTIVITAMINS THER. W/MINERALS) TABS Take 1 tablet by mouth daily.      Current Facility-Administered Medications on File Prior to Visit  Medication Dose Route Frequency Provider Last Rate Last Admin   topical emolient (BIAFINE) emulsion   Topical PRN Tyler Pita, MD   Given at 11/15/11 1401    Allergies: No Known Allergies Past Medical History:  Past Medical History:  Diagnosis Date   Astrocytoma ([place]) 09/17/2011   subsequent oligodendroglioma in 2022   Depression    Low testosterone 03/30/2013   Radiation 11/04/11-12/19/11   Left temporal brain 59.4 gray in 33 fractions   Skin cancer 09/09/1986   Past Surgical History:  Past Surgical  History:  Procedure Laterality Date   BACK SURGERY  1996   ruptured disc   CRANIOTOMY  09/04/2011   Procedure: CRANIOTOMY TUMOR EXCISION;  Surgeon: Floyce Stakes;  Location: [facility];  Service: Neurosurgery;  Laterality: Left;  Left Temporal craniectomy for tumor   MASS BIOPSY  09/05/2011   left temporal lobe   Social History:  Social History   Socioeconomic History   Marital status: Married    Spouse name: Not on file   Number of children: Not on file   Years of education: Not on file   Highest education level: Not on file  Occupational History   Occupation: history teacher at [employer]  Tobacco Use   Smoking status: Never   Smokeless tobacco: Never   Tobacco comments:    never used tobacco  Substance and Sexual Activity   Alcohol use: No    Alcohol/week: 0.0 standard drinks of alcohol   Drug use: No   Sexual activity: Not on file  Other Topics Concern   Not on file  Social History Narrative   Not on file   Social Determinants of Health   Financial Resource Strain: Not on file  Food Insecurity: No Food Insecurity (09/23/2022)   Hunger Vital Sign    Worried About Running Out of Food in the Last Year: Never true    Ran Out of Food in the Last Year: Never true  Transportation Needs: No Transportation Needs (09/23/2022)   [place] -  Hydrologist (Medical): No    Lack of Transportation (Non-Medical): No  Physical Activity: Not on file  Stress: Not on file  Social Connections: Not on file  Intimate Partner Violence: Not At Risk (09/24/2022)   Humiliation, Afraid, Rape, and Kick questionnaire    Fear of Current or Ex-Partner: No    Emotionally Abused: No    Physically Abused: No    Sexually Abused: No   Family History: No family history on file.  Review of Systems: Constitutional: Doesn't report fevers, chills or abnormal weight loss Eyes: Doesn't report blurriness of vision Ears, nose, mouth, throat, and face: Doesn't report  sore throat Respiratory: Doesn't report cough, dyspnea or wheezes Cardiovascular: Doesn't report palpitation, chest discomfort  Gastrointestinal:  Doesn't report nausea, constipation, diarrhea GU: Doesn't report incontinence Skin: Doesn't report skin rashes Neurological: Per HPI Musculoskeletal: Doesn't report joint pain Behavioral/Psych: Doesn't report anxiety  Physical Exam: Vitals:   11/04/22 1505  BP: (!) 150/92  Pulse: 94  Resp: 14  Temp: 97.8 F (36.6 C)  SpO2: 100%   KPS: 80. General: Alert, cooperative, pleasant, in no acute distress Head: Normal EENT: No conjunctival injection or scleral icterus.  Lungs: Resp effort normal Cardiac: Regular rate Abdomen: Non-distended abdomen Skin: No rashes cyanosis or petechiae. Extremities: No clubbing or edema  Neurologic Exam: Mental Status: Awake, alert, attentive to examiner. Oriented to self and environment. Language is modestly impaired with regards to fluency.  Age advanced pyschomotor slowing Cranial Nerves: Visual acuity is grossly normal. Visual fields are full. Extra-ocular movements intact. No ptosis. Face is symmetric Motor: Tone and bulk are normal. Power is full in both arms and legs. Reflexes are symmetric, no pathologic reflexes present.  Sensory: Intact to light touch Gait: Normal.   Labs: I have reviewed the data as listed    Component Value Date/Time   NA 141 10/28/2022 0946   NA 140 03/28/2015 0957   K 4.4 10/28/2022 0946   K 3.9 03/28/2015 0957   CL 104 10/28/2022 0946   CL 104 03/28/2015 0957   CO2 21 10/28/2022 0946   CO2 26 03/28/2015 0957   GLUCOSE 91 10/28/2022 0946   GLUCOSE 95 09/22/2022 0952   GLUCOSE 124 (H) 03/28/2015 0957   BUN 16 10/28/2022 0946   BUN 14 03/28/2015 0957   CREATININE 0.87 10/28/2022 0946   CREATININE 1.1 03/28/2015 0957   CALCIUM 9.7 10/28/2022 0946   CALCIUM 9.0 03/28/2015 0957   PROT 6.9 10/28/2022 0946   PROT 7.2 03/28/2015 0957   ALBUMIN 4.5 10/28/2022 0946    AST 19 10/28/2022 0946   AST 27 03/28/2015 0957   ALT 21 10/28/2022 0946   ALT 27 03/28/2015 0957   ALKPHOS 89 10/28/2022 0946   ALKPHOS 81 03/28/2015 0957   BILITOT 0.2 10/28/2022 0946   BILITOT 0.80 03/28/2015 0957   GFRNONAA >60 09/22/2022 0952   GFRAA >90 09/05/2011 0402   Lab Results  Component Value Date   WBC 15.0 (H) 09/22/2022   NEUTROABS 11.6 (H) 09/22/2022   HGB 15.3 09/22/2022   HCT 45.7 09/22/2022   MCV 81.0 09/22/2022   PLT 270 09/22/2022    Imaging: CLINICAL DATA:  History of atypical meningioma astrocytoma treated with radiation in 2013 and 20 presents with stroke-like symptoms.   EXAM: MRI HEAD WITHOUT AND WITH CONTRAST   TECHNIQUE: Multiplanar, multiecho pulse sequences of the brain and surrounding structures were obtained without and with intravenous contrast.   CONTRAST:  106m GADAVIST  GADOBUTROL 1 MMOL/ML IV SOLN   COMPARISON:  CT/CTA head and neck 1 day prior, brain MRI 09/02/2011. Report from an outside hospital brain MRI dated 04/09/2022 available in care everywhere. Images not available.   FINDINGS: Brain: There is no evidence of acute intracranial hemorrhage, extra-axial fluid collection, or acute infarct.   Postsurgical changes reflecting left-sided craniotomy for mass resection are noted. There is 4.5 cm AP x 3.5 cm TV resection cavity in the left temporal lobe with slight ex vacuo dilatation of the left temporal horn. There is expansile masslike FLAIR signal abnormality in the surrounding temporal lobe with involvement of the hippocampus extending to the insula with patchy areas of enhancement (12-13, 12-15, 12-16, 12-19). There is regional sulcal effacement without midline shift.   Otherwise, background parenchymal volume appears within normal limits. The ventricles are otherwise normal in size. Small foci of FLAIR signal abnormality in the remainder of the supratentorial white matter are nonspecific but likely reflects sequela of  mild underlying chronic small-vessel ischemic change   The pituitary and suprasellar region are normal.   Vascular: Normal flow voids.   Skull and upper cervical spine: Postsurgical changes as above. There is no suspicious marrow signal abnormality.   Sinuses/Orbits: The paranasal sinuses are clear. Bilateral lens implants are in place. The globes and orbits are otherwise unremarkable.   Other: None.   IMPRESSION: 1. Postsurgical changes reflecting prior left temporal tumor resection with infiltrative FLAIR signal abnormality throughout much of the left temporal lobe extending to the insula and hippocampus with patchy nodular enhancement consistent with infiltrative tumor. Note that the images from the prior brain MRI from [facility] dated 04/09/2022 are not available at the time of dictation for direct comparison. 2. No significant mass effect or midline shift.     Electronically Signed   By: Valetta Mole M.D.   On: 09/23/2022 18:49   Assessment/Plan Astrocytoma ([place])  Seizure ([place])  We appreciate the opportunity to participate in the care of Nathan Prince.  He presents today with expressive language dysfunction and encephalopathy which is multifactorial.  There has been subjective decline, however, following admission in January and rapid increase in AED burden to break status epilepticus.  This is interfering with his function at work and home.  We did discuss very slowly withdrawing the Depakote.  He understands this could improve his cognition, but that he would be at increased risk for breakthrough seizures and status epilepticus.  We are encouraged, however, by his years-long stability on '500mg'$  BID Keppra prior to this most recent admission.  He is agreeable with trial of reducing dose Depakote, to '500mg'$  BID.  We will not alter dosing of Keppra ('2000mg'$  BID) or Vimpat ('200mg'$  BID) at this time. We also strongly recommended he continue to follow up with epileptologist at  [facility], appt is for this summer.  For progressive astrocytoma, he will transition to Tibsovo '500mg'$  daily.  Baseline labs were reviewed.  We will check EKG today, and repeat in 1 month prior to onset of cycle #2.  MRI will be performed following cycle #2.  Informed consent was obtained verbally at bedside to proceed with oral chemotherapy.  Therapy should be held for the following:  QTC greater than 462mec  Platelets less than 100,000  LFT or creatinine greater than 2x ULN  If clinical concerns/contraindications develop  Screening for potential clinical trials was performed and discussed using eligibility criteria for [facility], as well as national database available on [email or web address]  We ask that Nathan Prince return to clinic in 1 months following cycle #1 Tibsovo with labs for evaluation, or sooner as needed.  We can continue to address AED burden at that time, as well.  We spent twenty additional minutes teaching regarding the natural history, biology, and historical experience in the treatment of brain tumors. We then discussed in detail the current recommendations for therapy focusing on the mode of administration, mechanism of action, anticipated toxicities, and quality of life issues associated with this plan. We also provided teaching sheets for the patient to take home as an additional resource.  All questions were answered. The patient knows to call the clinic with any problems, questions or concerns. No barriers to learning were detected.  The total time spent in the encounter was 60 minutes and more than 50% was on counseling and review of test results   Ventura Sellers, MD Medical Director of Neuro-Oncology [facility] at [place] 11/04/22 3:10 PM

## 2022-11-05 ENCOUNTER — Other Ambulatory Visit (HOSPITAL_COMMUNITY): Payer: Self-pay

## 2022-11-05 ENCOUNTER — Telehealth: Payer: Self-pay | Admitting: Pharmacist

## 2022-11-05 ENCOUNTER — Telehealth: Payer: Self-pay | Admitting: Pharmacy Technician

## 2022-11-05 NOTE — Telephone Encounter (Signed)
Oral Oncology Pharmacist Encounter  Received new prescription for Tibsovo (ivosidenib) for the treatment of IDH mutated astrocytoma, planned duration until disease progression or unacceptable drug toxicity.  CMP from 10/28/22 and CBC w/ Diff from 09/22/22 assessed, no baseline dose adjustments required. Baseline EKG obtained 11/04/22, QTcB 425 ms. Patient will need EKG's scheduled weekly for the first 3 weeks after initiation of Tibsovo and then monthly thereafter while on therapy. Prescription dose and frequency assessed for appropriateness.   Current medication list in Epic reviewed, DDIs with Tibsovo identified: Category C drug-drug interaction between Tiboso and Keppra - both agents may cause Qtc prolongation - patient will be monitored appropriately while on therapy.   Evaluated chart and no patient barriers to medication adherence noted.   Patient agreement for treatment documented in MD note on 11/04/22.  Prescription has been e-scribed to the [facility] for benefits analysis and approval.  Oral Oncology Clinic will continue to follow for insurance authorization, copayment issues, initial counseling and start date.  Leron Croak, PharmD, BCPS, BCOP Hematology/Oncology Clinical Pharmacist [facility] and [place] [phone] 11/05/2022 9:10 AM

## 2022-11-05 NOTE — Telephone Encounter (Signed)
Oral Oncology Patient Advocate Encounter   Began application for assistance for Tibsovo through [place].   Application will be submitted upon completion of necessary supporting documentation.   [employer] phone number [phone].   I will continue to check the status until final determination.   Lady Deutscher, CPhT-Adv Oncology Pharmacy Patient [place] Direct Number: [phone]  Fax: [phone]

## 2022-11-05 NOTE — Telephone Encounter (Signed)
Oral Oncology Patient Advocate Encounter  Received notification that the request for prior authorization for Tibsovo has been denied due to "plan only covers this drug when it is used for certain health conditions. Covered uses are: AML; Cholangiocarcinoma; Chondrosarcoma; and MDS."     An urgent appeal for the prior authorization denial of Tibsovo has been started by the pharmacist on 11/05/22.   This encounter will continue to be updated until final appeal determination.      Lady Deutscher, CPhT-Adv Oncology Pharmacy Patient [place] Direct Number: [phone]  Fax: [phone]

## 2022-11-05 NOTE — Telephone Encounter (Signed)
Oral Oncology Patient Advocate Encounter   Received notification that prior authorization for Tibsovo is required.   PA submitted on 11/05/22 [place] [id number] Status is pending     Lady Deutscher, CPhT-Adv Oncology Pharmacy Patient [place] Direct Number: [phone]  Fax: [phone]

## 2022-11-06 NOTE — Telephone Encounter (Signed)
Oral Oncology Pharmacist Encounter   Prior Authorization for Tibsovo has been denied.     Appeal letter sent to [facility] with supporting documentation. Appeal faxed to: [phone].    Oral Oncology Clinic will continue to follow.    Leron Croak, PharmD, BCPS, BCOP Hematology/Oncology Clinical Pharmacist [facility] and [place] [phone] 11/06/2022 2:24 PM

## 2022-11-07 NOTE — Telephone Encounter (Signed)
Oral Oncology Patient Advocate Encounter  Received notification that the request an appeal for prior authorization of Tibsovo has been denied. The plan maintains their position that the patient's diagnosis does not fall within the limitations of covered disease states.   Nathan Prince, CPhT-Adv Oncology Pharmacy Patient [place] Direct Number: [phone]  Fax: [phone]

## 2022-11-08 NOTE — Telephone Encounter (Signed)
Oral Oncology Pharmacist Encounter   1st level appeal for Tibsovo has been upheld. Coverage remains denied through patient's insurance.     Oral chemotherapy clinic has started process for manufacturer assistance at this time.     Oral Oncology Clinic will continue to follow.    Leron Croak, PharmD, BCPS, [place] Hematology/Oncology Clinical Pharmacist [facility] and [place] [phone] 11/08/2022 8:43 AM

## 2022-11-11 NOTE — Telephone Encounter (Signed)
Oral Oncology Patient Advocate Encounter   Submitted application for assistance for Tibsovo to [employer].   Application submitted via e-fax to [place] phone number [phone].   I will continue to check the status until final determination.   Lady Deutscher, CPhT-Adv Oncology Pharmacy Patient [place] Direct Number: [phone]  Fax: [phone]

## 2022-11-12 ENCOUNTER — Ambulatory Visit: Payer: Self-pay | Admitting: Neurology

## 2022-11-12 ENCOUNTER — Other Ambulatory Visit (HOSPITAL_COMMUNITY): Payer: Self-pay

## 2022-11-12 NOTE — Telephone Encounter (Signed)
Oral Oncology Pharmacist Encounter   2nd level urgent appeal letter sent to [facility] with supporting documentation. Appeal faxed to: [phone].    Oral Oncology Clinic will continue to follow.    Leron Croak, PharmD, BCPS, BCOP Hematology/Oncology Clinical Pharmacist [facility] and [place] [phone] 11/12/2022 12:26 PM

## 2022-11-12 NOTE — Telephone Encounter (Signed)
Oral Oncology Patient Advocate Encounter   Received notification that the application for assistance for Tibsovo through [employer] has been denied due to patient does not meet income requirements.   [employer] phone number [phone].   I have spoken to the patient.  Lady Deutscher, CPhT-Adv Oncology Pharmacy Patient [place] Direct Number: [phone]  Fax: [phone]

## 2022-11-13 ENCOUNTER — Other Ambulatory Visit (HOSPITAL_COMMUNITY): Payer: Self-pay

## 2022-11-14 ENCOUNTER — Other Ambulatory Visit (HOSPITAL_COMMUNITY): Payer: Self-pay

## 2022-11-14 NOTE — Telephone Encounter (Signed)
Oral Oncology Patient Advocate Encounter  Called and spoke with Nathan Prince regarding a possible appeal to the denial from [employer].   Proof of medical expenses sufficient to bring patient income below 60)% of the federal poverty line for a household of 2 may be submitted to the program for reconsideration.  I also discussed with patient that the program is considering him "effectively uninsured" due to his insurance denial and that if he is enrolled it would be for a year at a time.   Patient voiced understanding.  Nathan Prince, CPhT-Adv Oncology Pharmacy Patient [place] Direct Number: [phone]  Fax: [phone]

## 2022-11-15 ENCOUNTER — Other Ambulatory Visit: Payer: Self-pay

## 2022-11-15 ENCOUNTER — Telehealth: Payer: Self-pay | Admitting: Pharmacy Technician

## 2022-11-15 ENCOUNTER — Other Ambulatory Visit (HOSPITAL_COMMUNITY): Payer: Self-pay

## 2022-11-15 NOTE — Telephone Encounter (Signed)
Oral Oncology Patient Advocate Encounter  Prior Authorization for Tibsovo has been approved.    PA# [id number] Effective dates: 11/15/22 through 11/15/23  Patients co-pay is $0.    Lady Deutscher, CPhT-Adv Oncology Pharmacy Patient [place] Direct Number: [phone]  Fax: [phone]

## 2022-11-15 NOTE — Telephone Encounter (Signed)
Oral Oncology Pharmacist Encounter  Received call with Joy from [facility] and was notified that 2nd level appeal was overturned, and that prior authorization for Tibsovo has been approved.    Oral Oncology Clinic will continue to follow.   Leron Croak, PharmD, BCPS, BCOP Hematology/Oncology Clinical Pharmacist [facility] and [place] [phone] 11/15/2022 10:05 AM

## 2022-11-15 NOTE — Telephone Encounter (Signed)
Oral Chemotherapy Pharmacist Encounter  I spoke with patient's wife, Nathan Prince, for overview of new oral chemotherapy medication: Tibsovo (ivosidenib) for the treatment of IDH1 mutated astrocytoma, planned duration until disease progression or unacceptable toxicity.   Counseled on administration, dosing, side effects, monitoring, drug-food interactions, safe handling, storage, and disposal.  Patient will take Tibsovo 250 mg tablets, 2 tablets (500 mg) by mouth once daily, with or without food, at approximately the same time each day. Patient's wife aware that Tibsovo should not be taken with a high fat meal due to risk of very increased absorption.  Patient's wife aware Nathan Prince needs to avoid grapefruit and grapefruit juice while on therapy with Tibsovo.  Patient will need EKG's scheduled weekly for the first 3 weeks after initiation of Tibsovo and then monthly thereafter while on therapy. Message sent to MD regarding this. Patient's wife also aware of EKG monitoring during the beginning.   Tibsovo start date: 11/18/22 PM    Side effects include but not limited to: GI upset (nausea, diarrhea, abdominal pain, decreased appetite, vomiting, constipation), fatigue, lower extremity edema and headache.  Nausea: message sent to MD regarding anti-emetic Rx for patient to have on hand if nausea develops (recommend prochlorperazine to avoid Qtc risk with ondansetron)  Diarrhea: Patient will obtain anti diarrheal and alert the office of 4 or more loose stools above baseline.  Reviewed importance of keeping a medication schedule and plan for any missed doses. No barriers to medication adherence identified.  Medication reconciliation performed and medication/allergy list updated.  All questions answered.  Nathan Prince voiced understanding and appreciation.   Medication education handout placed in mail for patient. Patient and patient's wife know to call the office with questions or concerns. Oral  Chemotherapy Clinic phone number provided.   Leron Croak, PharmD, BCPS, [facility] Hematology/Oncology Clinical Pharmacist [facility] [place] [phone] 11/15/2022 12:24 PM

## 2022-11-15 NOTE — Telephone Encounter (Signed)
Oral Oncology Patient Advocate Encounter   Was successful in obtaining a copay card for Tibsovo.  This copay card will make the patients copay $25.  I have spoken with the patient.    The billing information is as follows and has been shared with [facility].   RxBin: [id number]  Member ID: [id number] Group ID: [id number]   Lady Deutscher, CPhT-Adv Oncology Pharmacy Patient [place] Direct Number: [phone]  Fax: [phone]

## 2022-11-18 ENCOUNTER — Other Ambulatory Visit (HOSPITAL_COMMUNITY): Payer: Self-pay

## 2022-11-18 ENCOUNTER — Other Ambulatory Visit: Payer: Self-pay | Admitting: Internal Medicine

## 2022-11-18 ENCOUNTER — Other Ambulatory Visit: Payer: Self-pay

## 2022-11-18 MED ORDER — PROCHLORPERAZINE MALEATE 10 MG PO TABS: 10.0000 mg | ORAL_TABLET | Freq: Four times a day (QID) | ORAL | 2 refills | Status: DC | PRN

## 2022-11-19 ENCOUNTER — Encounter: Payer: Self-pay | Admitting: *Deleted

## 2022-11-19 ENCOUNTER — Other Ambulatory Visit (HOSPITAL_COMMUNITY): Payer: Self-pay

## 2022-11-21 ENCOUNTER — Other Ambulatory Visit (HOSPITAL_COMMUNITY): Payer: Self-pay

## 2022-11-25 ENCOUNTER — Inpatient Hospital Stay: Payer: BC Managed Care – PPO | Attending: Internal Medicine | Admitting: Internal Medicine

## 2022-11-25 ENCOUNTER — Other Ambulatory Visit: Payer: Self-pay

## 2022-11-25 DIAGNOSIS — C718 Malignant neoplasm of overlapping sites of brain: Secondary | ICD-10-CM | POA: Diagnosis not present

## 2022-11-25 DIAGNOSIS — C719 Malignant neoplasm of brain, unspecified: Secondary | ICD-10-CM | POA: Diagnosis present

## 2022-11-26 ENCOUNTER — Other Ambulatory Visit (HOSPITAL_COMMUNITY): Payer: Self-pay

## 2022-11-29 ENCOUNTER — Other Ambulatory Visit (HOSPITAL_COMMUNITY): Payer: Self-pay

## 2022-12-02 ENCOUNTER — Other Ambulatory Visit: Payer: Self-pay

## 2022-12-02 ENCOUNTER — Inpatient Hospital Stay: Payer: BC Managed Care – PPO

## 2022-12-02 DIAGNOSIS — C718 Malignant neoplasm of overlapping sites of brain: Secondary | ICD-10-CM | POA: Diagnosis not present

## 2022-12-05 ENCOUNTER — Other Ambulatory Visit (HOSPITAL_COMMUNITY): Payer: Self-pay

## 2022-12-09 ENCOUNTER — Other Ambulatory Visit: Payer: Self-pay | Admitting: *Deleted

## 2022-12-09 ENCOUNTER — Inpatient Hospital Stay: Payer: BC Managed Care – PPO | Attending: Internal Medicine

## 2022-12-09 ENCOUNTER — Other Ambulatory Visit (HOSPITAL_COMMUNITY): Payer: Self-pay

## 2022-12-09 ENCOUNTER — Ambulatory Visit: Payer: BC Managed Care – PPO | Admitting: Neurology

## 2022-12-09 DIAGNOSIS — Z79899 Other long term (current) drug therapy: Secondary | ICD-10-CM | POA: Insufficient documentation

## 2022-12-09 DIAGNOSIS — C719 Malignant neoplasm of brain, unspecified: Secondary | ICD-10-CM

## 2022-12-09 DIAGNOSIS — Z923 Personal history of irradiation: Secondary | ICD-10-CM | POA: Insufficient documentation

## 2022-12-09 DIAGNOSIS — Z85828 Personal history of other malignant neoplasm of skin: Secondary | ICD-10-CM | POA: Insufficient documentation

## 2022-12-09 NOTE — Progress Notes (Signed)
[place] Work  Clinical Social Work was referred per new patient protocol for assessment of psychosocial needs.  Clinical Social Worker contacted caregiver by phone to offer support and assess for needs.    CSW spoke with patient's wife, Nathan Prince, and informed her of role.  Provided contact information and she agreed to have [employer] literature mailed to her.  Patient continues working full-time.  He has a follow-up appointment with Dr. Mickeal Skinner tomorrow after starting his oral chemotherapy.  She stated she will contact CSW should she have any needs.     Margaree Mackintosh, LCSW  Clinical Social Worker [facility]

## 2022-12-10 ENCOUNTER — Inpatient Hospital Stay (HOSPITAL_BASED_OUTPATIENT_CLINIC_OR_DEPARTMENT_OTHER): Payer: BC Managed Care – PPO | Admitting: Internal Medicine

## 2022-12-10 ENCOUNTER — Telehealth: Payer: Self-pay | Admitting: Internal Medicine

## 2022-12-10 ENCOUNTER — Other Ambulatory Visit: Payer: Self-pay

## 2022-12-10 ENCOUNTER — Inpatient Hospital Stay: Payer: BC Managed Care – PPO

## 2022-12-10 VITALS — BP 146/91 | HR 88 | Temp 98.3°F | Resp 16 | Wt 230.9 lb

## 2022-12-10 DIAGNOSIS — Z923 Personal history of irradiation: Secondary | ICD-10-CM | POA: Diagnosis not present

## 2022-12-10 DIAGNOSIS — C719 Malignant neoplasm of brain, unspecified: Secondary | ICD-10-CM | POA: Diagnosis present

## 2022-12-10 DIAGNOSIS — Z79899 Other long term (current) drug therapy: Secondary | ICD-10-CM | POA: Diagnosis not present

## 2022-12-10 DIAGNOSIS — R569 Unspecified convulsions: Secondary | ICD-10-CM

## 2022-12-10 DIAGNOSIS — Z85828 Personal history of other malignant neoplasm of skin: Secondary | ICD-10-CM | POA: Diagnosis not present

## 2022-12-10 LAB — CMP (CANCER CENTER ONLY)
ALT: 20 U/L (ref 0–44)
AST: 20 U/L (ref 15–41)
Albumin: 4.3 g/dL (ref 3.5–5.0)
Alkaline Phosphatase: 54 U/L (ref 38–126)
Anion gap: 9 (ref 5–15)
BUN: 18 mg/dL (ref 6–20)
CO2: 27 mmol/L (ref 22–32)
Calcium: 9.7 mg/dL (ref 8.9–10.3)
Chloride: 107 mmol/L (ref 98–111)
Creatinine: 1.04 mg/dL (ref 0.61–1.24)
GFR, Estimated: >60 mL/min (ref >60)
Glucose, Bld: 99 mg/dL (ref 70–99)
Potassium: 3.8 mmol/L (ref 3.5–5.1)
Sodium: 143 mmol/L (ref 135–145)
Total Bilirubin: 0.5 mg/dL (ref 0.3–1.2)
Total Protein: 6.9 g/dL (ref 6.5–8.1)

## 2022-12-10 LAB — CBC WITH DIFFERENTIAL (CANCER CENTER ONLY)
Abs Immature Granulocytes: 0.02 10*3/uL (ref 0.00–0.07)
Basophils Absolute: 0.1 10*3/uL (ref 0.0–0.1)
Basophils Relative: 1 %
Eosinophils Absolute: 0.1 10*3/uL (ref 0.0–0.5)
Eosinophils Relative: 1 %
HCT: 47.6 % (ref 39.0–52.0)
Hemoglobin: 15.9 g/dL (ref 13.0–17.0)
Immature Granulocytes: 0 %
Lymphocytes Relative: 29 %
Lymphs Abs: 1.9 10*3/uL (ref 0.7–4.0)
MCH: 28.4 pg (ref 26.0–34.0)
MCHC: 33.4 g/dL (ref 30.0–36.0)
MCV: 85.2 fL (ref 80.0–100.0)
Monocytes Absolute: 0.8 10*3/uL (ref 0.1–1.0)
Monocytes Relative: 11 %
Neutro Abs: 3.9 10*3/uL (ref 1.7–7.7)
Neutrophils Relative %: 58 %
Platelet Count: 216 10*3/uL (ref 150–400)
RBC: 5.59 MIL/uL (ref 4.22–5.81)
RDW: 13.8 % (ref 11.5–15.5)
WBC Count: 6.7 10*3/uL (ref 4.0–10.5)
nRBC: 0 % (ref 0.0–0.2)

## 2022-12-10 MED ORDER — DIVALPROEX SODIUM 250 MG PO DR TAB: 250.0000 mg | DELAYED_RELEASE_TABLET | Freq: Two times a day (BID) | ORAL | 3 refills | Status: DC

## 2022-12-10 NOTE — Telephone Encounter (Signed)
Reached out to patient to schedule per 4/2 LOS, left voicemail.

## 2022-12-10 NOTE — Progress Notes (Signed)
[facility] [address] [place], [place] [address] [phone]   Interval Evaluation  Date of Service: 12/10/22 Patient Name: Nathan Prince Patient MRN: [id number] Patient DOB: 1966-08-08 Provider: Ventura Sellers, MD  Identifying Statement:  Nathan Prince is a 57 y.o. 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 Pathology: astrocytoma (WHO Grade II) per patient report.  10/02/2011 Surgery Craniotomy for gross total resection performed by Derrill Memo, MD. Pathology: anaplastic oligoastrocytoma (WHO Grade III).  - 12/31/2011 Radiation Radiographically stable after conclusion of radiation therapy with concurrent temozolomide 75 mg/m2 daily. Recommended proceeding with five day temozolomide, dosed at 150 mg/m2 for the first cycle and escalated to 200 mg/m2 for subsequent cycles if well tolerated.  01/01/2012 - 10/27/2012 Chemotherapy Eleven cycles of five day temozolomide. Hypometabolic PET scan. Recommended discontinuing temozolomide. Serial MRI monitoring initiated.  02/27/2021 Clinical Event-Other MRI on 02/27/21 reveals new very small area of enhancement medial to resection cavity best seen on axials, concerning for possible disease progression; note made of new left frontal enhancing lesion, which was not present on the 04/11/20 scan.  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 Derrill Memo for resection of tumor.  03/27/2021 Radiology Study MRI brain 4 week follow up scan shows growth of the left frontal lesion. Increased left frontal convexity enhancing dural based lesion measuring 1.9 cm AP by 1.0 cm TV by 2.0 cm CC (1.4 x 0.7 x 1.8 cm on 02/27/2021 exam). Recommend resection with Dr. Tommi Rumps.  07/31/2021 Radiology Study MRI post radiation for left frontal meningioma and for left temporal oligoastrocytoma surveillance. These two sites are stable. Small enhancing focus medial to left temporal resection cavity is apparent on today's scan comparable to July 2022. Will follow up with a short term MRI in another 6 weeks.  Atypical meningioma of brain (CMS-HCC) 04/20/2021 Initial Diagnosis Atypical meningioma of brain (CMS-HCC)  04/20/2021 Surgery Resection of left frontal convexity mass with Derrill Memo. Pathology: atypical meningioma, WHO grade II. TERT negative.  05/31/2021 - 07/16/2021 Radiation IMRT to the left frontal convexity resected typical meningioma total dose 57.6 Gy  07/31/2021 Radiology Study MRI post radiation for left frontal meningioma and for left temporal oligoastrocytoma surveillance. These two sites are stable. Small enhancing focus medial to left temporal resection cavity is apparent on today's scan comparable to July 2022. Will follow up with a short term MRI in another 6 weeks.   Biomarkers:  MGMT Unknown.  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  EGFR Unknown  TERT Unknown   Interval History: Nathan Prince presents today for follow up, now having completed 1 cycle of oral Tibsovo.  No new or progressive changes.  He actually feels improvement in energy and cognition since decreasing depakote to 500mg  BID.  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  Has MRI and [facility]  visit scheduled for the end of  April.   H+P (11/04/22) Patient presents today to establish care for local treatment of progressive grade 3 astrocytoma, IDHmt.  He and his wife describe decline in function, energy, cognition, language expression since his hospitalization in January for status epilepticus.  He is still teaching full time, and is having difficulty in class with communication and overall energy.  Currently dosing Keppra 2000mg  BID, Vimpat 200mg  BID, and Depakote 1000mg  BID.  Not on any steroids.  He will be proceeding with Tibsovo therapy per [facility] team recommendations.  Medications: Current Outpatient Medications on File Prior to Visit  Medication Sig Dispense Refill   prochlorperazine (COMPAZINE) 10 MG tablet Take 1 tablet (10 mg total) by mouth every 6 (six) hours as needed for nausea or vomiting. 30 tablet 2   Apoaequorin (PREVAGEN PO) Take 1 tablet by mouth daily.     Calcium Carb-Cholecalciferol (CALCIUM 600/VITAMIN D3 PO) Take 1 tablet by mouth 2 (two) times daily.     diazePAM, 20 MG Dose, (VALTOCO 20 MG DOSE) 2 x 10 MG/0.1ML LQPK Place 1 spray into both nostrils daily as needed for up to 5 doses (for seizure lasting over 5 minutes). (Patient not taking: Reported on 11/04/2022) 2 each 3   divalproex (DEPAKOTE) 500 MG DR tablet Take 1 tablet (500 mg total) by mouth 2 (two) times daily. 30 tablet 0   ivosidenib (TIBSOVO) 250 MG tablet Take 2 tablets (500 mg total) by mouth daily. Avoid taking with a high-fat meal. 60 tablet 1   lacosamide (VIMPAT) 200 MG TABS tablet Take 1 tablet (200 mg total) by mouth 2 (two) times daily. 60 tablet 2   levETIRAcetam (KEPPRA) 1000 MG tablet Take 2 tablets (2,000 mg total) by mouth 2 (two) times daily. 120 tablet 2   loratadine (CLARITIN) 10 MG tablet Take 10 mg by mouth daily as needed for allergies.     Multiple Vitamins-Minerals (MULTIVITAMINS THER. W/MINERALS) TABS Take 1 tablet by mouth daily.      Current Facility-Administered Medications on File Prior to Visit  Medication  Dose Route Frequency Provider Last Rate Last Admin   topical emolient (BIAFINE) emulsion   Topical PRN Tyler Pita, MD   Given at 11/15/11 1401    Allergies: No Known Allergies Past Medical History:  Past Medical History:  Diagnosis Date   Astrocytoma ([place]) 09/17/2011   subsequent oligodendroglioma in 2022   Depression    Low testosterone 03/30/2013   Radiation 11/04/11-12/19/11   Left temporal brain 59.4 gray in 33 fractions   Skin cancer 09/09/1986   Past Surgical History:  Past Surgical History:  Procedure Laterality Date   BACK SURGERY  09/09/1994   ruptured disc   CRANIOTOMY  09/04/2011   Procedure: CRANIOTOMY TUMOR EXCISION;  Surgeon: Floyce Stakes;  Location: [place] [facility];  Service: Neurosurgery;  Laterality: Left;  Left Temporal craniectomy for tumor   MASS BIOPSY  09/05/2011   left temporal lobe   skin cancer removal  1988   Removed skin cancer to left chest   Social History:  Social History   Socioeconomic History   Marital status: Married    Spouse name: Not on file   Number of children: Not on file   Years of education: Not on file   Highest education level: Not on file  Occupational History   Occupation: history teacher at [employer]  Tobacco Use   Smoking status: Never   Smokeless tobacco: Never   Tobacco comments:  never used tobacco  Vaping Use   Vaping Use: Never used  Substance and Sexual Activity   Alcohol use: No    Alcohol/week: 0.0 standard drinks of alcohol   Drug use: No   Sexual activity: Not on file  Other Topics Concern   Not on file  Social History Narrative   Not on file   Social Determinants of Health   Financial Resource Strain: Not on file  Food Insecurity: No Food Insecurity (09/23/2022)   Hunger Vital Sign    Worried About Running Out of Food in the Last Year: Never true    Ran Out of Food in the Last Year: Never true  Transportation Needs: No Transportation Needs (09/23/2022)   PRAPARE - Armed forces logistics/support/administrative officer (Medical): No    Lack of Transportation (Non-Medical): No  Physical Activity: Not on file  Stress: Not on file  Social Connections: Not on file  Intimate Partner Violence: Not At Risk (09/24/2022)   Humiliation, Afraid, Rape, and Kick questionnaire    Fear of Current or Ex-Partner: No    Emotionally Abused: No    Physically Abused: No    Sexually Abused: No   Family History: No family history on file.  Review of Systems: Constitutional: Doesn't report fevers, chills or abnormal weight loss Eyes: Doesn't report blurriness of vision Ears, nose, mouth, throat, and face: Doesn't report sore throat Respiratory: Doesn't report cough, dyspnea or wheezes Cardiovascular: Doesn't report palpitation, chest discomfort  Gastrointestinal:  Doesn't report nausea, constipation, diarrhea GU: Doesn't report incontinence Skin: Doesn't report skin rashes Neurological: Per HPI Musculoskeletal: Doesn't report joint pain Behavioral/Psych: Doesn't report anxiety  Physical Exam: Vitals:   12/10/22 0942  BP: (!) 146/91  Pulse: 88  Resp: 16  Temp: 98.3 F (36.8 C)  SpO2: 97%   KPS: 80. General: Alert, cooperative, pleasant, in no acute distress Head: Normal EENT: No conjunctival injection or scleral icterus.  Lungs: Resp effort normal Cardiac: Regular rate Abdomen: Non-distended abdomen Skin: No rashes cyanosis or petechiae. Extremities: No clubbing or edema  Neurologic Exam: Mental Status: Awake, alert, attentive to examiner. Oriented to self and environment. Language is modestly impaired with regards to fluency.  Age advanced pyschomotor slowing Cranial Nerves: Visual acuity is grossly normal. Visual fields are full. Extra-ocular movements intact. No ptosis. Face is symmetric Motor: Tone and bulk are normal. Power is full in both arms and legs. Reflexes are symmetric, no pathologic reflexes present.  Sensory: Intact to light touch Gait: Normal.   Labs: I have  reviewed the data as listed    Component Value Date/Time   NA 141 10/28/2022 0946   NA 140 03/28/2015 0957   K 4.4 10/28/2022 0946   K 3.9 03/28/2015 0957   CL 104 10/28/2022 0946   CL 104 03/28/2015 0957   CO2 21 10/28/2022 0946   CO2 26 03/28/2015 0957   GLUCOSE 91 10/28/2022 0946   GLUCOSE 95 09/22/2022 0952   GLUCOSE 124 (H) 03/28/2015 0957   BUN 16 10/28/2022 0946   BUN 14 03/28/2015 0957   CREATININE 0.87 10/28/2022 0946   CREATININE 1.1 03/28/2015 0957   CALCIUM 9.7 10/28/2022 0946   CALCIUM 9.0 03/28/2015 0957   PROT 6.9 10/28/2022 0946   PROT 7.2 03/28/2015 0957   ALBUMIN 4.5 10/28/2022 0946   AST 19 10/28/2022 0946   AST 27 03/28/2015 0957   ALT 21 10/28/2022 0946   ALT 27 03/28/2015 0957   ALKPHOS 89 10/28/2022 0946  ALKPHOS 81 03/28/2015 0957   BILITOT 0.2 10/28/2022 0946   BILITOT 0.80 03/28/2015 0957   GFRNONAA >60 09/22/2022 0952   GFRAA >90 09/05/2011 0402   Lab Results  Component Value Date   WBC 6.7 12/10/2022   NEUTROABS 3.9 12/10/2022   HGB 15.9 12/10/2022   HCT 47.6 12/10/2022   MCV 85.2 12/10/2022   PLT 216 12/10/2022    Assessment/Plan Astrocytoma  Seizure  Nathan Prince is clinically stable today.  He has responded well to lower dose of Depakote without any breakthrough seizures or auras.  We discussed decreasing depakote further, to 250mg  BID, given ongoing issues with language and memory.  He understands this could increase the risk of breakthrough focal seizure.  We will not alter dosing of Keppra (2000mg  BID) or Vimpat (200mg  BID) at this time.   He has appointment with epileptologist at [facility] in June.  For astrocytoma, he will continue Tibsovo 500mg  daily. ECGs were reviewed today, WNL.  MRI will be performed following cycle #2.  Therapy should be held for the following:  QTC greater than 471msec  Platelets less than 100,000  LFT or creatinine greater than 2x ULN  If clinical concerns/contraindications develop  We ask  that Brit Hermosillo return to clinic in 1 months following cycle #2 Tibsovo with MRI brain for evaluation, or sooner as needed.    All questions were answered. The patient knows to call the clinic with any problems, questions or concerns. No barriers to learning were detected.  The total time spent in the encounter was 40 minutes and more than 50% was on counseling and review of test results   Ventura Sellers, MD Medical Director of Neuro-Oncology [facility] [place] 12/10/22 10:07 AM

## 2022-12-11 ENCOUNTER — Other Ambulatory Visit (HOSPITAL_COMMUNITY): Payer: Self-pay

## 2022-12-12 ENCOUNTER — Other Ambulatory Visit: Payer: Self-pay

## 2022-12-13 ENCOUNTER — Other Ambulatory Visit: Payer: Self-pay | Admitting: Internal Medicine

## 2022-12-13 DIAGNOSIS — C719 Malignant neoplasm of brain, unspecified: Secondary | ICD-10-CM

## 2022-12-15 ENCOUNTER — Encounter: Payer: Self-pay | Admitting: Internal Medicine

## 2023-01-06 ENCOUNTER — Other Ambulatory Visit: Payer: Self-pay | Admitting: *Deleted

## 2023-01-06 DIAGNOSIS — C719 Malignant neoplasm of brain, unspecified: Secondary | ICD-10-CM

## 2023-01-07 ENCOUNTER — Inpatient Hospital Stay: Payer: BC Managed Care – PPO

## 2023-01-07 ENCOUNTER — Other Ambulatory Visit (HOSPITAL_COMMUNITY): Payer: Self-pay

## 2023-01-07 ENCOUNTER — Other Ambulatory Visit: Payer: Self-pay

## 2023-01-07 ENCOUNTER — Inpatient Hospital Stay (HOSPITAL_BASED_OUTPATIENT_CLINIC_OR_DEPARTMENT_OTHER): Payer: BC Managed Care – PPO | Admitting: Internal Medicine

## 2023-01-07 VITALS — BP 138/91 | HR 67 | Temp 98.1°F | Resp 20 | Ht 70.0 in | Wt 227.1 lb

## 2023-01-07 DIAGNOSIS — C719 Malignant neoplasm of brain, unspecified: Secondary | ICD-10-CM

## 2023-01-07 DIAGNOSIS — R569 Unspecified convulsions: Secondary | ICD-10-CM

## 2023-01-07 LAB — CBC WITH DIFFERENTIAL (CANCER CENTER ONLY)
Abs Immature Granulocytes: 0.02 10*3/uL (ref 0.00–0.07)
Basophils Absolute: 0 10*3/uL (ref 0.0–0.1)
Basophils Relative: 1 %
Eosinophils Absolute: 0.1 10*3/uL (ref 0.0–0.5)
Eosinophils Relative: 1 %
HCT: 46.7 % (ref 39.0–52.0)
Hemoglobin: 15.9 g/dL (ref 13.0–17.0)
Immature Granulocytes: 0 %
Lymphocytes Relative: 30 %
Lymphs Abs: 1.8 10*3/uL (ref 0.7–4.0)
MCH: 28.9 pg (ref 26.0–34.0)
MCHC: 34 g/dL (ref 30.0–36.0)
MCV: 84.8 fL (ref 80.0–100.0)
Monocytes Absolute: 0.6 10*3/uL (ref 0.1–1.0)
Monocytes Relative: 11 %
Neutro Abs: 3.4 10*3/uL (ref 1.7–7.7)
Neutrophils Relative %: 57 %
Platelet Count: 201 10*3/uL (ref 150–400)
RBC: 5.51 MIL/uL (ref 4.22–5.81)
RDW: 13.5 % (ref 11.5–15.5)
WBC Count: 6 10*3/uL (ref 4.0–10.5)
nRBC: 0 % (ref 0.0–0.2)

## 2023-01-07 LAB — CMP (CANCER CENTER ONLY)
ALT: 16 U/L (ref 0–44)
AST: 18 U/L (ref 15–41)
Albumin: 4.6 g/dL (ref 3.5–5.0)
Alkaline Phosphatase: 58 U/L (ref 38–126)
Anion gap: 7 (ref 5–15)
BUN: 16 mg/dL (ref 6–20)
CO2: 29 mmol/L (ref 22–32)
Calcium: 10 mg/dL (ref 8.9–10.3)
Chloride: 105 mmol/L (ref 98–111)
Creatinine: 1.05 mg/dL (ref 0.61–1.24)
GFR, Estimated: >60 mL/min (ref >60)
Glucose, Bld: 98 mg/dL (ref 70–99)
Potassium: 4.3 mmol/L (ref 3.5–5.1)
Sodium: 141 mmol/L (ref 135–145)
Total Bilirubin: 0.6 mg/dL (ref 0.3–1.2)
Total Protein: 7 g/dL (ref 6.5–8.1)

## 2023-01-07 MED ORDER — LEVETIRACETAM 1000 MG PO TABS: 2000.0000 mg | ORAL_TABLET | Freq: Two times a day (BID) | ORAL | 2 refills | Status: DC

## 2023-01-07 NOTE — Progress Notes (Signed)
[facility] at [facility] [address]  [place], [place] [address] [phone]   Interval Evaluation  Date of Service: 01/07/23 Patient Name: Nathan Prince Patient MRN: [id number] Patient DOB: Sep 04, 1966 Provider: Henreitta Leber, MD  Identifying Statement:  Nathan Prince is a 57 y.o. 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 Pathology: astrocytoma (WHO Grade II) per patient report.  10/02/2011 Surgery Craniotomy for gross total resection performed by Lavonna Monarch, MD. Pathology: anaplastic oligoastrocytoma (WHO Grade III).  - 12/31/2011 Radiation Radiographically stable after conclusion of radiation therapy with concurrent temozolomide 75 mg/m2 daily. Recommended proceeding with five day temozolomide, dosed at 150 mg/m2 for the first cycle and escalated to 200 mg/m2 for subsequent cycles if well tolerated.  01/01/2012 - 10/27/2012 Chemotherapy Eleven cycles of five day temozolomide. Hypometabolic PET scan. Recommended discontinuing temozolomide. Serial MRI monitoring initiated.  02/27/2021 Clinical Event-Other MRI on 02/27/21 reveals new very small area of enhancement medial to resection cavity best seen on axials, concerning for possible disease progression; note made of new left frontal enhancing lesion, which was not present on the 04/11/20 scan.  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 Lavonna Monarch for resection of tumor.  03/27/2021 Radiology Study MRI brain 4 week follow up scan shows growth of the left frontal lesion. Increased left frontal convexity enhancing dural based lesion measuring 1.9 cm AP by 1.0 cm TV by 2.0 cm CC (1.4 x 0.7 x 1.8 cm on 02/27/2021 exam). Recommend resection with Dr. Zachery Conch.  07/31/2021 Radiology Study MRI post radiation for left frontal meningioma and for left temporal oligoastrocytoma surveillance. These two sites are stable. Small enhancing focus medial to left temporal resection cavity is apparent on today's scan comparable to July 2022. Will follow up with a short term MRI in another 6 weeks.  Atypical meningioma of brain (CMS-HCC) 04/20/2021 Initial Diagnosis Atypical meningioma of brain (CMS-HCC)  04/20/2021 Surgery Resection of left frontal convexity mass with Lavonna Monarch. Pathology: atypical meningioma, WHO grade II. TERT negative.  05/31/2021 - 07/16/2021 Radiation IMRT to the left frontal convexity resected typical meningioma total dose 57.6 Gy  07/31/2021 Radiology Study MRI post radiation for left frontal meningioma and for left temporal oligoastrocytoma surveillance. These two sites are stable. Small enhancing focus medial to left temporal resection cavity is apparent on today's scan comparable to July 2022. Will follow up with a short term MRI in another 6 weeks.   Biomarkers:  MGMT Unknown.  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  EGFR Unknown  TERT Unknown   Interval History: Nathan Prince presents today for follow up, now having completed 1 cycle of oral Tibsovo.  No new or progressive changes.  Continues to have "mettalic taste" auras sporadically, but this was not increased when he forgot to dose his depakote for a full week.  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    H+P  (11/04/22) Patient presents today to establish care for local treatment of progressive grade 3 astrocytoma, IDHmt.  He and his wife describe decline in function, energy, cognition, language expression since his hospitalization in January for status epilepticus.  He is still teaching full time, and is having difficulty in class with communication and overall energy.  Currently dosing Keppra 2000mg  BID, Vimpat 200mg  BID, and Depakote 1000mg  BID.  Not on any steroids.  He will be proceeding with Tibsovo therapy per [facility] team recommendations.  Medications: Current Outpatient Medications on File Prior to Visit  Medication Sig Dispense Refill   prochlorperazine (COMPAZINE) 10 MG tablet Take 1 tablet (10 mg total) by mouth every 6 (six) hours as needed for nausea or vomiting. 30 tablet 2   Apoaequorin (PREVAGEN PO) Take 1 tablet by mouth daily.     Calcium Carb-Cholecalciferol (CALCIUM 600/VITAMIN D3 PO) Take 1 tablet by mouth 2 (two) times daily.     diazePAM, 20 MG Dose, (VALTOCO 20 MG DOSE) 2 x 10 MG/0.1ML LQPK Place 1 spray into both nostrils daily as needed for up to 5 doses (for seizure lasting over 5 minutes). (Patient not taking: Reported on 11/04/2022) 2 each 3   divalproex (DEPAKOTE) 250 MG DR tablet TAKE 1 TABLET BY MOUTH TWICE A DAY 180 tablet 1   ivosidenib (TIBSOVO) 250 MG tablet Take 2 tablets (500 mg total) by mouth daily. Avoid taking with a high-fat meal. 60 tablet 1   lacosamide (VIMPAT) 200 MG TABS tablet Take 1 tablet (200 mg total) by mouth 2 (two) times daily. 60 tablet 2   levETIRAcetam (KEPPRA) 1000 MG tablet Take 2 tablets (2,000 mg total) by mouth 2 (two) times daily. 120 tablet 2   loratadine (CLARITIN) 10 MG tablet Take 10 mg by mouth daily as needed for allergies.     Multiple Vitamins-Minerals (MULTIVITAMINS THER. W/MINERALS) TABS Take 1 tablet by mouth daily.      Current Facility-Administered Medications on File Prior to Visit  Medication Dose Route Frequency Provider Last  Rate Last Admin   topical emolient (BIAFINE) emulsion   Topical PRN Margaretmary Dys, MD   Given at 11/15/11 1401    Allergies: No Known Allergies Past Medical History:  Past Medical History:  Diagnosis Date   Astrocytoma (HCC) 09/17/2011   subsequent oligodendroglioma in 2022   Depression    Low testosterone 03/30/2013   Radiation 11/04/11-12/19/11   Left temporal brain 59.4 gray in 33 fractions   Skin cancer 09/09/1986   Past Surgical History:  Past Surgical History:  Procedure Laterality Date   BACK SURGERY  09/09/1994   ruptured disc   CRANIOTOMY  09/04/2011   Procedure: CRANIOTOMY TUMOR EXCISION;  Surgeon: Karn Cassis;  Location: [facility];  Service: Neurosurgery;  Laterality: Left;  Left Temporal craniectomy for tumor   MASS BIOPSY  09/05/2011   left temporal lobe   skin cancer removal  1988   Removed skin cancer to left chest   Social History:  Social History   Socioeconomic History   Marital status: Married    Spouse name: Not on file   Number of children: Not on file   Years of education: Not on file   Highest education level: Not on file  Occupational History   Occupation: history teacher at [employer]  Tobacco Use   Smoking status: Never   Smokeless tobacco: Never   Tobacco comments:    never used tobacco  Vaping Use   Vaping Use: Never used  Substance and Sexual Activity   Alcohol use: No    Alcohol/week: 0.0 standard drinks of alcohol   Drug use: No   Sexual activity: Not on file  Other Topics Concern   Not on file  Social History Narrative   Not on file   Social Determinants of Health   Financial Resource Strain: Not on file  Food Insecurity: No Food Insecurity (09/23/2022)   Hunger Vital Sign    Worried About Running Out of Food in the Last Year: Never true    Ran Out of Food in the Last Year: Never true  Transportation Needs: No Transportation Needs (09/23/2022)   PRAPARE - Administrator, Civil Service (Medical): No     Lack of Transportation (Non-Medical): No  Physical Activity: Not on file  Stress: Not on file  Social Connections: Not on file  Intimate Partner Violence: Not At Risk (09/24/2022)   Humiliation, Afraid, Rape, and Kick questionnaire    Fear of Current or Ex-Partner: No    Emotionally Abused: No    Physically Abused: No    Sexually Abused: No   Family History: No family history on file.  Review of Systems: Constitutional: Doesn't report fevers, chills or abnormal weight loss Eyes: Doesn't report blurriness of vision Ears, nose, mouth, throat, and face: Doesn't report sore throat Respiratory: Doesn't report cough, dyspnea or wheezes Cardiovascular: Doesn't report palpitation, chest discomfort  Gastrointestinal:  Doesn't report nausea, constipation, diarrhea GU: Doesn't report incontinence Skin: Doesn't report skin rashes Neurological: Per HPI Musculoskeletal: Doesn't report joint pain Behavioral/Psych: Doesn't report anxiety  Physical Exam: Vitals:   01/07/23 0954  BP: (!) 138/91  Pulse: 67  Resp: 20  Temp: 98.1 F (36.7 C)  SpO2: 99%    KPS: 80. General: Alert, cooperative, pleasant, in no acute distress Head: Normal EENT: No conjunctival injection or scleral icterus.  Lungs: Resp effort normal Cardiac: Regular rate Abdomen: Non-distended abdomen Skin: No rashes cyanosis or petechiae. Extremities: No clubbing or edema  Neurologic Exam: Mental Status: Awake, alert, attentive to examiner. Oriented to self and environment. Language is modestly impaired with regards to fluency.  Age advanced pyschomotor slowing Cranial Nerves: Visual acuity is grossly normal. Visual fields are full. Extra-ocular movements intact. No ptosis. Face is symmetric Motor: Tone and bulk are normal. Power is full in both arms and legs. Reflexes are symmetric, no pathologic reflexes present.  Sensory: Intact to light touch Gait: Normal.   Labs: I have reviewed the data as listed     Component Value Date/Time   NA 143 12/10/2022 0925   NA 141 10/28/2022 0946   NA 140 03/28/2015 0957   K 3.8 12/10/2022 0925   K 3.9 03/28/2015 0957   CL 107 12/10/2022 0925   CL 104 03/28/2015 0957   CO2 27 12/10/2022 0925   CO2 26 03/28/2015 0957   GLUCOSE 99 12/10/2022 0925   GLUCOSE 124 (H) 03/28/2015 0957   BUN 18 12/10/2022 0925   BUN 16 10/28/2022 0946   BUN 14 03/28/2015 0957   CREATININE 1.04 12/10/2022 0925   CREATININE 1.1 03/28/2015 0957   CALCIUM 9.7 12/10/2022 0925   CALCIUM 9.0 03/28/2015 0957   PROT 6.9 12/10/2022 0925   PROT 6.9 10/28/2022 0946   PROT 7.2 03/28/2015 0957   ALBUMIN 4.3 12/10/2022 0925   ALBUMIN 4.5 10/28/2022 0946   AST 20 12/10/2022 0925   ALT 20 12/10/2022 0925   ALT 27 03/28/2015  0957   ALKPHOS 54 12/10/2022 0925   ALKPHOS 81 03/28/2015 0957   BILITOT 0.5 12/10/2022 0925   GFRNONAA >60 12/10/2022 0925   GFRAA >90 09/05/2011 0402   Lab Results  Component Value Date   WBC 6.0 01/07/2023   NEUTROABS 3.4 01/07/2023   HGB 15.9 01/07/2023   HCT 46.7 01/07/2023   MCV 84.8 01/07/2023   PLT 201 01/07/2023   EXAM: MRI BRAIN WITH AND WITHOUT CONTRAST   INDICATION: 57 years old Male with Brain/CNS neoplasm, monitor,  oligodendroglioma, grade 3, 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   Technique: Multisequence, multiplanar MR imaging of the brain was obtained  without and with contrast.   CONTRAST: 11 ml Vueway.   COMPARISON: 10/29/2022 and prior brain MRIs.   FINDINGS:   Brain tumor findings: Redemonstrated postsurgical changes related to left  temporal craniotomy and subjacent resection cavity. Similar T2/FLAIR signal  hyperintensity about the resection cavity as well as stable enhancement  about the medial aspect of the resection cavity involving the medial left  temporal lobe (series 9 image 45). No new enhancement identified.   Brain parenchyma: No MR evidence of acute infarction or intracranial   hemorrhage. Stable scattered foci of white matter T2/FLAIR signal  abnormality compatible with white matter chronic microvascular disease  changes.  No abnormal brain parenchymal enhancement except as described  above.   Ventricles, cisterns and sulci: Stable ex vacuo dilatation of the left  lateral ventricle. Stable mild atrophy.   Dura: Normal.   Extra-axial fluid collections: None.   Orbits, sinuses: Normal.   Calvarium, scalp: Normal.   Intracranial vasculature: Normal flow voids.   IMPRESSION:  Stable left temporal T2/FLAIR signal abnormality and enhancement along the  medial aspect of the left temporal resection cavity. Brain tumor follow-up  score 2: No change.   Electronically Signed by:  Geralyn Flash, MD, [facility]iology  Electronically Signed on:  01/03/2023 11:07 AM    Assessment/Plan Astrocytoma [facility])  Seizure (HCC)  Nathan Prince is clinically stable today, now having completed 2 cycles of oral Tibsovo for IDH mutant astrocytoma.  MRI brain demonstrates stable findings.   He continues to respond well to lower dose of Depakote without any breakthrough seizures or increase in brief auras.  We discussed and recommended formally discontinuing Depakote at this time, again due to encephalopathy and occupational interference.   We will not alter dosing of Keppra (2000mg  BID) or Vimpat (200mg  BID) at this time.   He has appointment with epileptologist at [facility] in June.  For astrocytoma, he will continue with cycle #3 Tibsovo 500mg  daily.  Next MRI will be performed following cycle #4.  Therapy should be held for the following:  QTC greater than  Platelets less than 100,000  LFT or creatinine greater than 2x ULN  If clinical concerns/contraindications develop  We ask that Mattson Dayal return to clinic in 1 months following cycle #3 Tibsovo with MRI brain for evaluation, or sooner as needed.    All questions were answered. The patient knows to  call the clinic with any problems, questions or concerns. No barriers to learning were detected.  The total time spent in the encounter was 30 minutes and more than 50% was on counseling and review of test results   Henreitta Leber, MD Medical Director of Neuro-Oncology [facility] 01/07/23 9:53 AM

## 2023-01-09 ENCOUNTER — Other Ambulatory Visit: Payer: Self-pay | Admitting: Internal Medicine

## 2023-01-09 ENCOUNTER — Other Ambulatory Visit (HOSPITAL_COMMUNITY): Payer: Self-pay

## 2023-01-09 ENCOUNTER — Other Ambulatory Visit: Payer: Self-pay

## 2023-01-09 DIAGNOSIS — C719 Malignant neoplasm of brain, unspecified: Secondary | ICD-10-CM

## 2023-01-09 MED ORDER — TIBSOVO 250 MG PO TABS
500.0000 mg | ORAL_TABLET | Freq: Every day | ORAL | 1 refills | Status: DC
Start: 2023-01-09 — End: 2023-03-10
  Filled 2023-01-09: qty 60, 30d supply, fill #0
  Filled 2023-02-04: qty 60, 30d supply, fill #1

## 2023-02-04 ENCOUNTER — Ambulatory Visit: Payer: BC Managed Care – PPO | Admitting: Internal Medicine

## 2023-02-04 ENCOUNTER — Other Ambulatory Visit (HOSPITAL_COMMUNITY): Payer: Self-pay

## 2023-02-04 ENCOUNTER — Other Ambulatory Visit: Payer: BC Managed Care – PPO

## 2023-02-04 ENCOUNTER — Other Ambulatory Visit: Payer: Self-pay

## 2023-02-05 ENCOUNTER — Other Ambulatory Visit: Payer: Self-pay | Admitting: *Deleted

## 2023-02-05 DIAGNOSIS — C719 Malignant neoplasm of brain, unspecified: Secondary | ICD-10-CM

## 2023-02-06 ENCOUNTER — Inpatient Hospital Stay: Payer: BC Managed Care – PPO | Attending: Internal Medicine

## 2023-02-06 ENCOUNTER — Inpatient Hospital Stay (HOSPITAL_BASED_OUTPATIENT_CLINIC_OR_DEPARTMENT_OTHER): Payer: BC Managed Care – PPO | Admitting: Internal Medicine

## 2023-02-06 ENCOUNTER — Inpatient Hospital Stay: Payer: BC Managed Care – PPO

## 2023-02-06 VITALS — BP 142/74 | HR 72 | Temp 97.7°F | Resp 17 | Wt 228.1 lb

## 2023-02-06 DIAGNOSIS — Z923 Personal history of irradiation: Secondary | ICD-10-CM | POA: Diagnosis not present

## 2023-02-06 DIAGNOSIS — R569 Unspecified convulsions: Secondary | ICD-10-CM | POA: Insufficient documentation

## 2023-02-06 DIAGNOSIS — C719 Malignant neoplasm of brain, unspecified: Secondary | ICD-10-CM

## 2023-02-06 DIAGNOSIS — Z9221 Personal history of antineoplastic chemotherapy: Secondary | ICD-10-CM | POA: Insufficient documentation

## 2023-02-06 DIAGNOSIS — Z79899 Other long term (current) drug therapy: Secondary | ICD-10-CM | POA: Insufficient documentation

## 2023-02-06 LAB — CBC WITH DIFFERENTIAL (CANCER CENTER ONLY)
Abs Immature Granulocytes: 0.03 10*3/uL (ref 0.00–0.07)
Basophils Absolute: 0.1 10*3/uL (ref 0.0–0.1)
Basophils Relative: 1 %
Eosinophils Absolute: 0.1 10*3/uL (ref 0.0–0.5)
Eosinophils Relative: 1 %
HCT: 47.7 % (ref 39.0–52.0)
Hemoglobin: 15.8 g/dL (ref 13.0–17.0)
Immature Granulocytes: 0 %
Lymphocytes Relative: 28 %
Lymphs Abs: 1.9 10*3/uL (ref 0.7–4.0)
MCH: 29.2 pg (ref 26.0–34.0)
MCHC: 33.1 g/dL (ref 30.0–36.0)
MCV: 88 fL (ref 80.0–100.0)
Monocytes Absolute: 0.7 10*3/uL (ref 0.1–1.0)
Monocytes Relative: 10 %
Neutro Abs: 4.1 10*3/uL (ref 1.7–7.7)
Neutrophils Relative %: 60 %
Platelet Count: 206 10*3/uL (ref 150–400)
RBC: 5.42 MIL/uL (ref 4.22–5.81)
RDW: 13.2 % (ref 11.5–15.5)
WBC Count: 6.8 10*3/uL (ref 4.0–10.5)
nRBC: 0 % (ref 0.0–0.2)

## 2023-02-06 LAB — CMP (CANCER CENTER ONLY)
ALT: 15 U/L (ref 0–44)
AST: 17 U/L (ref 15–41)
Albumin: 4.3 g/dL (ref 3.5–5.0)
Alkaline Phosphatase: 55 U/L (ref 38–126)
Anion gap: 6 (ref 5–15)
BUN: 20 mg/dL (ref 6–20)
CO2: 31 mmol/L (ref 22–32)
Calcium: 9.3 mg/dL (ref 8.9–10.3)
Chloride: 105 mmol/L (ref 98–111)
Creatinine: 0.98 mg/dL (ref 0.61–1.24)
GFR, Estimated: >60 mL/min (ref >60)
Glucose, Bld: 95 mg/dL (ref 70–99)
Potassium: 4.4 mmol/L (ref 3.5–5.1)
Sodium: 142 mmol/L (ref 135–145)
Total Bilirubin: 0.5 mg/dL (ref 0.3–1.2)
Total Protein: 6.8 g/dL (ref 6.5–8.1)

## 2023-02-06 NOTE — Progress Notes (Signed)
[facility] CSW Progress Note  Visual merchandiser met with patient and his wife, Clydie Braun, to provide program information.  Discussed advance directives and gave them clinic information.  Also gave them program specific information regarding the [address] Program and [address] and Wellness.  Mailed Agricultural engineer from [employer] regarding insurance and employment.  They expressed no other needs.    Dorothey Baseman, LCSW Clinical Social Worker [facility]

## 2023-02-06 NOTE — Progress Notes (Signed)
[facility] [address]  [place], [place] [address] [phone]   Interval Evaluation  Date of Service: 02/06/23 Patient Name: Nathan Prince Patient MRN: [id number] Patient DOB: [birth year] Provider: Henreitta Leber, MD  Identifying Statement:  Nathan Prince is a 57 y.o. 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 Pathology: astrocytoma (WHO Grade II) per patient report.  10/02/2011 Surgery Craniotomy for gross total resection performed by Lavonna Monarch, MD. Pathology: anaplastic oligoastrocytoma (WHO Grade III).  - 12/31/2011 Radiation Radiographically stable after conclusion of radiation therapy with concurrent temozolomide 75 mg/m2 daily. Recommended proceeding with five day temozolomide, dosed at 150 mg/m2 for the first cycle and escalated to 200 mg/m2 for subsequent cycles if well tolerated.  01/01/2012 - 10/27/2012 Chemotherapy Eleven cycles of five day temozolomide. Hypometabolic PET scan. Recommended discontinuing temozolomide. Serial MRI monitoring initiated.  02/27/2021 Clinical Event-Other MRI on 02/27/21 reveals new very small area of enhancement medial to resection cavity best seen on axials, concerning for possible disease progression; note made of new left frontal enhancing lesion, which was not present on the 04/11/20 scan.  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 Lavonna Monarch for resection of tumor.  03/27/2021 Radiology Study MRI brain 4 week follow up scan shows growth of the left frontal lesion. Increased left frontal convexity enhancing dural based lesion measuring 1.9 cm AP by 1.0 cm TV by 2.0 cm CC (1.4 x 0.7 x 1.8 cm on 02/27/2021 exam). Recommend resection with Dr. Zachery Conch.  07/31/2021 Radiology Study MRI post radiation for left frontal meningioma and for left temporal oligoastrocytoma surveillance. These two sites are stable. Small enhancing focus medial to left temporal resection cavity is apparent on today's scan comparable to July 2022. Will follow up with a short term MRI in another 6 weeks.  Atypical meningioma of brain (CMS-HCC) 04/20/2021 Initial Diagnosis Atypical meningioma of brain (CMS-HCC)  04/20/2021 Surgery Resection of left frontal convexity mass with Lavonna Monarch. Pathology: atypical meningioma, WHO grade II. TERT negative.  05/31/2021 - 07/16/2021 Radiation IMRT to the left frontal convexity resected typical meningioma total dose 57.6 Gy  07/31/2021 Radiology Study MRI post radiation for left frontal meningioma and for left temporal oligoastrocytoma surveillance. These two sites are stable. Small enhancing focus medial to left temporal resection cavity is apparent on today's scan comparable to July 2022. Will follow up with a short term MRI in another 6 weeks.   Biomarkers:  MGMT Unknown.  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  EGFR Unknown  TERT Unknown   Interval History: Nathan Prince presents today for follow up, now having completed cycle #3 of oral Tibsovo.  No new or progressive changes.  Continues to have "mettalic taste" auras sporadically, these remain self limited.  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    H+P (11/04/22) Patient presents today  to establish care for  local treatment of progressive grade 3 astrocytoma, IDHmt.  He and his wife describe decline in function, energy, cognition, language expression since his hospitalization in January for status epilepticus.  He is still teaching full time, and is having difficulty in class with communication and overall energy.  Currently dosing Keppra 2000mg  BID, Vimpat 200mg  BID, and Depakote 1000mg  BID.  Not on any steroids.  He will be proceeding with Tibsovo therapy per [facility] team recommendations.  Medications: Current Outpatient Medications on File Prior to Visit  Medication Sig Dispense Refill   Apoaequorin (PREVAGEN PO) Take 1 tablet by mouth daily.     Calcium Carb-Cholecalciferol (CALCIUM 600/VITAMIN D3 PO) Take 1 tablet by mouth 2 (two) times daily.     diazePAM, 20 MG Dose, (VALTOCO 20 MG DOSE) 2 x 10 MG/0.1ML LQPK Place 1 spray into both nostrils daily as needed for up to 5 doses (for seizure lasting over 5 minutes). (Patient not taking: Reported on 11/04/2022) 2 each 3   divalproex (DEPAKOTE) 250 MG DR tablet TAKE 1 TABLET BY MOUTH TWICE A DAY 180 tablet 1   ivosidenib (TIBSOVO) 250 MG tablet Take 2 tablets (500 mg total) by mouth daily. Avoid taking with a high-fat meal. 60 tablet 1   lacosamide (VIMPAT) 200 MG TABS tablet Take 1 tablet (200 mg total) by mouth 2 (two) times daily. 60 tablet 2   levETIRAcetam (KEPPRA) 1000 MG tablet Take 2 tablets (2,000 mg total) by mouth 2 (two) times daily. 120 tablet 2   loratadine (CLARITIN) 10 MG tablet Take 10 mg by mouth daily as needed for allergies.     Multiple Vitamins-Minerals (MULTIVITAMINS THER. W/MINERALS) TABS Take 1 tablet by mouth daily.      prochlorperazine (COMPAZINE) 10 MG tablet Take 1 tablet (10 mg total) by mouth every 6 (six) hours as needed for nausea or vomiting. 30 tablet 2   Current Facility-Administered Medications on File Prior to Visit  Medication Dose Route Frequency Provider Last Rate Last Admin   topical emolient (BIAFINE) emulsion    Topical PRN Margaretmary Dys, MD   Given at 11/15/11 1401    Allergies: No Known Allergies Past Medical History:  Past Medical History:  Diagnosis Date   Astrocytoma (HCC) 09/17/2011   subsequent oligodendroglioma in 2022   Depression    Low testosterone 03/30/2013   Radiation 11/04/11-12/19/11   Left temporal brain 59.4 gray in 33 fractions   Skin cancer 09/09/1986   Past Surgical History:  Past Surgical History:  Procedure Laterality Date   BACK SURGERY  09/09/1994   ruptured disc   CRANIOTOMY  09/04/2011   Procedure: CRANIOTOMY TUMOR EXCISION;  Surgeon: Karn Cassis;  Location: [facility];  Service: Neurosurgery;  Laterality: Left;  Left Temporal craniectomy for tumor   MASS BIOPSY  09/05/2011   left temporal lobe   skin cancer removal  1988   Removed skin cancer to left chest   Social History:  Social History   Socioeconomic History   Marital status: Married    Spouse name: Not on file   Number of children: Not on file   Years of education: Not on file   Highest education level: Not on file  Occupational History   Occupation: history teacher at [employer]  Tobacco Use   Smoking status: Never   Smokeless tobacco: Never   Tobacco comments:    never used tobacco  Vaping Use   Vaping Use: Never used  Substance and Sexual  Activity   Alcohol use: No    Alcohol/week: 0.0 standard drinks of alcohol   Drug use: No   Sexual activity: Not on file  Other Topics Concern   Not on file  Social History Narrative   Not on file   Social Determinants of Health   Financial Resource Strain: Not on file  Food Insecurity: No Food Insecurity (09/23/2022)   Hunger Vital Sign    Worried About Running Out of Food in the Last Year: Never true    Ran Out of Food in the Last Year: Never true  Transportation Needs: No Transportation Needs (09/23/2022)   PRAPARE - Administrator, Civil Service (Medical): No    Lack of Transportation (Non-Medical): No  Physical  Activity: Not on file  Stress: Not on file  Social Connections: Not on file  Intimate Partner Violence: Not At Risk (09/24/2022)   Humiliation, Afraid, Rape, and Kick questionnaire    Fear of Current or Ex-Partner: No    Emotionally Abused: No    Physically Abused: No    Sexually Abused: No   Family History: No family history on file.  Review of Systems: Constitutional: Doesn't report fevers, chills or abnormal weight loss Eyes: Doesn't report blurriness of vision Ears, nose, mouth, throat, and face: Doesn't report sore throat Respiratory: Doesn't report cough, dyspnea or wheezes Cardiovascular: Doesn't report palpitation, chest discomfort  Gastrointestinal:  Doesn't report nausea, constipation, diarrhea GU: Doesn't report incontinence Skin: Doesn't report skin rashes Neurological: Per HPI Musculoskeletal: Doesn't report joint pain Behavioral/Psych: Doesn't report anxiety  Physical Exam: Vitals:   02/06/23 0851  BP: (!) 142/74  Pulse: 72  Resp: 17  Temp: 97.7 F (36.5 C)  SpO2: 97%    KPS: 80. General: Alert, cooperative, pleasant, in no acute distress Head: Normal EENT: No conjunctival injection or scleral icterus.  Lungs: Resp effort normal Cardiac: Regular rate Abdomen: Non-distended abdomen Skin: No rashes cyanosis or petechiae. Extremities: No clubbing or edema  Neurologic Exam: Mental Status: Awake, alert, attentive to examiner. Oriented to self and environment. Language is modestly impaired with regards to fluency.  Age advanced pyschomotor slowing Cranial Nerves: Visual acuity is grossly normal. Visual fields are full. Extra-ocular movements intact. No ptosis. Face is symmetric Motor: Tone and bulk are normal. Power is full in both arms and legs. Reflexes are symmetric, no pathologic reflexes present.  Sensory: Intact to light touch Gait: Normal.   Labs: I have reviewed the data as listed    Component Value Date/Time   NA 141 01/07/2023 0936   NA 141  10/28/2022 0946   NA 140 03/28/2015 0957   K 4.3 01/07/2023 0936   K 3.9 03/28/2015 0957   CL 105 01/07/2023 0936   CL 104 03/28/2015 0957   CO2 29 01/07/2023 0936   CO2 26 03/28/2015 0957   GLUCOSE 98 01/07/2023 0936   GLUCOSE 124 (H) 03/28/2015 0957   BUN 16 01/07/2023 0936   BUN 16 10/28/2022 0946   BUN 14 03/28/2015 0957   CREATININE 1.05 01/07/2023 0936   CREATININE 1.1 03/28/2015 0957   CALCIUM 10.0 01/07/2023 0936   CALCIUM 9.0 03/28/2015 0957   PROT 7.0 01/07/2023 0936   PROT 6.9 10/28/2022 0946   PROT 7.2 03/28/2015 0957   ALBUMIN 4.6 01/07/2023 0936   ALBUMIN 4.5 10/28/2022 0946   AST 18 01/07/2023 0936   ALT 16 01/07/2023 0936   ALT 27 03/28/2015 0957   ALKPHOS 58 01/07/2023 0936   ALKPHOS 81 03/28/2015  0957   BILITOT 0.6 01/07/2023 0936   GFRNONAA >60 01/07/2023 0936   GFRAA >90 09/05/2011 0402   Lab Results  Component Value Date   WBC 6.8 02/06/2023   NEUTROABS 4.1 02/06/2023   HGB 15.8 02/06/2023   HCT 47.7 02/06/2023   MCV 88.0 02/06/2023   PLT 206 02/06/2023     Assessment/Plan Astrocytoma (HCC)  Seizure (HCC)  Darian Pepin is clinically stable today, now having completed 3 cycles of oral Tibsovo for IDH mutant astrocytoma.  No new or progressive  changes.  He continues to respond well to lower dose of Depakote without any breakthrough seizures or increase in brief auras.  We discussed and recommended formally discontinuing Depakote at this time, again due to encephalopathy and occupational interference.   We will not alter dosing of Keppra (2000mg  BID) or Vimpat (200mg  BID) at this time.   He has appointment with epileptologist at [facility] in June.  For astrocytoma, he will continue with cycle #4 Tibsovo 500mg  daily.  Next MRI will be performed following cycle #5.  Therapy should be held for the following:  QTC greater than  Platelets less than 100,000  LFT or creatinine greater than 2x ULN  If clinical  concerns/contraindications develop  We ask that Akshath Sapio return to clinic in 1 months following cycle #4 Tibsovo with labs for evaluation, or sooner as needed.    All questions were answered. The patient knows to call the clinic with any problems, questions or concerns. No barriers to learning were detected.  The total time spent in the encounter was 30 minutes and more than 50% was on counseling and review of test results   Henreitta Leber, MD Medical Director of Neuro-Oncology [facility] at [facility] 02/06/23 9:01 AM

## 2023-02-07 ENCOUNTER — Other Ambulatory Visit (HOSPITAL_COMMUNITY): Payer: Self-pay

## 2023-02-28 ENCOUNTER — Other Ambulatory Visit: Payer: Self-pay

## 2023-03-06 ENCOUNTER — Inpatient Hospital Stay: Payer: BC Managed Care – PPO | Attending: Internal Medicine

## 2023-03-06 ENCOUNTER — Other Ambulatory Visit: Payer: Self-pay

## 2023-03-06 ENCOUNTER — Inpatient Hospital Stay (HOSPITAL_BASED_OUTPATIENT_CLINIC_OR_DEPARTMENT_OTHER): Payer: BC Managed Care – PPO | Admitting: Internal Medicine

## 2023-03-06 ENCOUNTER — Other Ambulatory Visit (HOSPITAL_COMMUNITY): Payer: Self-pay

## 2023-03-06 ENCOUNTER — Other Ambulatory Visit: Payer: Self-pay | Admitting: *Deleted

## 2023-03-06 VITALS — BP 149/90 | HR 67 | Temp 98.1°F | Resp 17 | Wt 229.0 lb

## 2023-03-06 DIAGNOSIS — R569 Unspecified convulsions: Secondary | ICD-10-CM

## 2023-03-06 DIAGNOSIS — Z79899 Other long term (current) drug therapy: Secondary | ICD-10-CM | POA: Diagnosis not present

## 2023-03-06 DIAGNOSIS — Z923 Personal history of irradiation: Secondary | ICD-10-CM | POA: Insufficient documentation

## 2023-03-06 DIAGNOSIS — Z85828 Personal history of other malignant neoplasm of skin: Secondary | ICD-10-CM | POA: Diagnosis not present

## 2023-03-06 DIAGNOSIS — C719 Malignant neoplasm of brain, unspecified: Secondary | ICD-10-CM

## 2023-03-06 DIAGNOSIS — Z9221 Personal history of antineoplastic chemotherapy: Secondary | ICD-10-CM | POA: Diagnosis not present

## 2023-03-06 LAB — CMP (CANCER CENTER ONLY)
ALT: 14 U/L (ref 0–44)
AST: 16 U/L (ref 15–41)
Albumin: 4.2 g/dL (ref 3.5–5.0)
Alkaline Phosphatase: 64 U/L (ref 38–126)
Anion gap: 6 (ref 5–15)
BUN: 13 mg/dL (ref 6–20)
CO2: 29 mmol/L (ref 22–32)
Calcium: 10.1 mg/dL (ref 8.9–10.3)
Chloride: 105 mmol/L (ref 98–111)
Creatinine: 0.97 mg/dL (ref 0.61–1.24)
GFR, Estimated: >60 mL/min (ref >60)
Glucose, Bld: 91 mg/dL (ref 70–99)
Potassium: 4.8 mmol/L (ref 3.5–5.1)
Sodium: 140 mmol/L (ref 135–145)
Total Bilirubin: 0.4 mg/dL (ref 0.3–1.2)
Total Protein: 7.1 g/dL (ref 6.5–8.1)

## 2023-03-06 LAB — CBC WITH DIFFERENTIAL (CANCER CENTER ONLY)
Abs Immature Granulocytes: 0.03 10*3/uL (ref 0.00–0.07)
Basophils Absolute: 0.1 10*3/uL (ref 0.0–0.1)
Basophils Relative: 1 %
Eosinophils Absolute: 0.1 10*3/uL (ref 0.0–0.5)
Eosinophils Relative: 1 %
HCT: 47.5 % (ref 39.0–52.0)
Hemoglobin: 16.1 g/dL (ref 13.0–17.0)
Immature Granulocytes: 0 %
Lymphocytes Relative: 27 %
Lymphs Abs: 1.9 10*3/uL (ref 0.7–4.0)
MCH: 29.9 pg (ref 26.0–34.0)
MCHC: 33.9 g/dL (ref 30.0–36.0)
MCV: 88.1 fL (ref 80.0–100.0)
Monocytes Absolute: 0.7 10*3/uL (ref 0.1–1.0)
Monocytes Relative: 9 %
Neutro Abs: 4.3 10*3/uL (ref 1.7–7.7)
Neutrophils Relative %: 62 %
Platelet Count: 211 10*3/uL (ref 150–400)
RBC: 5.39 MIL/uL (ref 4.22–5.81)
RDW: 12.4 % (ref 11.5–15.5)
WBC Count: 7 10*3/uL (ref 4.0–10.5)
nRBC: 0 % (ref 0.0–0.2)

## 2023-03-06 NOTE — Progress Notes (Signed)
[facility] [address]  [place], [place] [address] [phone]   Interval Evaluation  Date of Service: 03/06/23 Patient Name: Lazarius Rivkin Patient MRN: [id number] Patient DOB: 05/13/1966 Provider: Henreitta Leber, MD  Identifying Statement:  Rubert Frediani is a 57 y.o. 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 Pathology: astrocytoma (WHO Grade II) per patient report.  10/02/2011 Surgery Craniotomy for gross total resection performed by Lavonna Monarch, MD. Pathology: anaplastic oligoastrocytoma (WHO Grade III).  - 12/31/2011 Radiation Radiographically stable after conclusion of radiation therapy with concurrent temozolomide 75 mg/m2 daily. Recommended proceeding with five day temozolomide, dosed at 150 mg/m2 for the first cycle and escalated to 200 mg/m2 for subsequent cycles if well tolerated.  01/01/2012 - 10/27/2012 Chemotherapy Eleven cycles of five day temozolomide. Hypometabolic PET scan. Recommended discontinuing temozolomide. Serial MRI monitoring initiated.  02/27/2021 Clinical Event-Other MRI on 02/27/21 reveals new very small area of enhancement medial to resection cavity best seen on axials, concerning for possible disease progression; note made of new left frontal enhancing lesion, which was not present on the 04/11/20 scan.  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 Lavonna Monarch for resection of tumor.  03/27/2021 Radiology Study MRI brain 4 week follow up scan shows growth of the left frontal lesion. Increased left frontal convexity enhancing dural based lesion measuring 1.9 cm AP by 1.0 cm TV by 2.0 cm CC (1.4 x 0.7 x 1.8 cm on 02/27/2021 exam). Recommend resection with Dr. Zachery Conch.  07/31/2021 Radiology Study MRI post radiation for left frontal meningioma and for left temporal oligoastrocytoma surveillance. These two sites are stable. Small enhancing focus medial to left temporal resection cavity is apparent on today's scan comparable to July 2022. Will follow up with a short term MRI in another 6 weeks.  Atypical meningioma of brain (CMS-HCC) 04/20/2021 Initial Diagnosis Atypical meningioma of brain (CMS-HCC)  04/20/2021 Surgery Resection of left frontal convexity mass with Lavonna Monarch. Pathology: atypical meningioma, WHO grade II. TERT negative.  05/31/2021 - 07/16/2021 Radiation IMRT to the left frontal convexity resected typical meningioma total dose 57.6 Gy  07/31/2021 Radiology Study MRI post radiation for left frontal meningioma and for left temporal oligoastrocytoma surveillance. These two sites are stable. Small enhancing focus medial to left temporal resection cavity is apparent on today's scan comparable to July 2022. Will follow up with a short term MRI in another 6 weeks.   Biomarkers:  MGMT Unknown.  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  EGFR Unknown  TERT Unknown   Interval History: Celeste Candelas presents today for follow up, now having completed cycle #4 of oral Tibsovo.  No new or progressive changes.  Auras with metallic taste are stable.  He is now dosing the Keppra as extended release following visit with epileptologist at [facility].  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    H+P  (11/04/22) Patient presents today to establish care for local treatment of progressive grade 3 astrocytoma, IDHmt.  He and his wife describe decline in function, energy, cognition, language expression since his hospitalization in January for status epilepticus.  He is still teaching full time, and is having difficulty in class with communication and overall energy.  Currently dosing Keppra 2000mg  BID, Vimpat 200mg  BID, and Depakote 1000mg  BID.  Not on any steroids.  He will be proceeding with Tibsovo therapy per [facility] team recommendations.  Medications: Current Outpatient Medications on File Prior to Visit  Medication Sig Dispense Refill   Apoaequorin (PREVAGEN PO) Take 1 tablet by mouth daily.     Calcium Carb-Cholecalciferol (CALCIUM 600/VITAMIN D3 PO) Take 1 tablet by mouth 2 (two) times daily.     clonazePAM (KLONOPIN) 1 MG disintegrating tablet Take 1 mg by mouth daily as needed.     Cyanocobalamin (VITAMIN B 12 PO) Take 1 tablet by mouth 2 (two) times daily.     ivosidenib (TIBSOVO) 250 MG tablet Take 2 tablets (500 mg total) by mouth daily. Avoid taking with a high-fat meal. 60 tablet 1   lacosamide (VIMPAT) 200 MG TABS tablet Take 1 tablet (200 mg total) by mouth 2 (two) times daily. 60 tablet 2   levETIRAcetam (KEPPRA XR) 500 MG 24 hr tablet Take 2,000 mg by mouth 2 (two) times daily.     loratadine (CLARITIN) 10 MG tablet Take 10 mg by mouth daily as needed for allergies.     Multiple Vitamins-Minerals (MULTIVITAMINS THER. W/MINERALS) TABS Take 1 tablet by mouth daily.      prochlorperazine (COMPAZINE) 10 MG tablet Take 1 tablet (10 mg total) by mouth every 6 (six) hours as needed for nausea or vomiting. 30 tablet 2   diazePAM, 20 MG Dose, (VALTOCO 20 MG DOSE) 2 x 10 MG/0.1ML LQPK Place 1 spray into both nostrils daily as needed for up to 5 doses (for seizure lasting over 5 minutes). (Patient not taking: Reported on 11/04/2022) 2 each 3   divalproex (DEPAKOTE) 250 MG DR tablet TAKE 1 TABLET BY  MOUTH TWICE A DAY (Patient not taking: Reported on 03/06/2023) 180 tablet 1   levETIRAcetam (KEPPRA) 1000 MG tablet Take 2 tablets (2,000 mg total) by mouth 2 (two) times daily. (Patient not taking: Reported on 03/06/2023) 120 tablet 2   Current Facility-Administered Medications on File Prior to Visit  Medication Dose Route Frequency Provider Last Rate Last Admin   topical emolient (BIAFINE) emulsion   Topical PRN Margaretmary Dys, MD   Given at 11/15/11 1401    Allergies: No Known Allergies Past Medical History:  Past Medical History:  Diagnosis Date   Astrocytoma (HCC) 09/17/2011   subsequent oligodendroglioma in 2022   Depression    Low testosterone 03/30/2013   Radiation 11/04/11-12/19/11   Left temporal brain 59.4 gray in 33 fractions   Skin cancer 09/09/1986   Past Surgical History:  Past Surgical History:  Procedure Laterality Date   BACK SURGERY  09/09/1994   ruptured disc   CRANIOTOMY  09/04/2011   Procedure: CRANIOTOMY TUMOR EXCISION;  Surgeon: Karn Cassis;  Location: [facility];  Service: Neurosurgery;  Laterality: Left;  Left Temporal craniectomy for tumor   MASS BIOPSY  09/05/2011   left temporal lobe   skin cancer removal  1988   Removed skin cancer to left chest   Social History:  Social History   Socioeconomic History   Marital status: Married  Spouse name: Not on file   Number of children: Not on file   Years of education: Not on file   Highest education level: Not on file  Occupational History   Occupation: history teacher at [employer]  Tobacco Use   Smoking status: Never   Smokeless tobacco: Never   Tobacco comments:    never used tobacco  Vaping Use   Vaping Use: Never used  Substance and Sexual Activity   Alcohol use: No    Alcohol/week: 0.0 standard drinks of alcohol   Drug use: No   Sexual activity: Not on file  Other Topics Concern   Not on file  Social History Narrative   Not on file   Social Determinants of Health    Financial Resource Strain: Not on file  Food Insecurity: No Food Insecurity (09/23/2022)   Hunger Vital Sign    Worried About Running Out of Food in the Last Year: Never true    Ran Out of Food in the Last Year: Never true  Transportation Needs: No Transportation Needs (09/23/2022)   PRAPARE - Administrator, Civil Service (Medical): No    Lack of Transportation (Non-Medical): No  Physical Activity: Not on file  Stress: Not on file  Social Connections: Not on file  Intimate Partner Violence: Not At Risk (09/24/2022)   Humiliation, Afraid, Rape, and Kick questionnaire    Fear of Current or Ex-Partner: No    Emotionally Abused: No    Physically Abused: No    Sexually Abused: No   Family History: No family history on file.  Review of Systems: Constitutional: Doesn't report fevers, chills or abnormal weight loss Eyes: Doesn't report blurriness of vision Ears, nose, mouth, throat, and face: Doesn't report sore throat Respiratory: Doesn't report cough, dyspnea or wheezes Cardiovascular: Doesn't report palpitation, chest discomfort  Gastrointestinal:  Doesn't report nausea, constipation, diarrhea GU: Doesn't report incontinence Skin: Doesn't report skin rashes Neurological: Per HPI Musculoskeletal: Doesn't report joint pain Behavioral/Psych: Doesn't report anxiety  Physical Exam: Vitals:   03/06/23 1027  BP: (!) 149/90  Pulse: 67  Resp: 17  Temp: 98.1 F (36.7 C)  SpO2: 99%    KPS: 80. General: Alert, cooperative, pleasant, in no acute distress Head: Normal EENT: No conjunctival injection or scleral icterus.  Lungs: Resp effort normal Cardiac: Regular rate Abdomen: Non-distended abdomen Skin: No rashes cyanosis or petechiae. Extremities: No clubbing or edema  Neurologic Exam: Mental Status: Awake, alert, attentive to examiner. Oriented to self and environment. Language is modestly impaired with regards to fluency.  Age advanced pyschomotor slowing Cranial  Nerves: Visual acuity is grossly normal. Visual fields are full. Extra-ocular movements intact. No ptosis. Face is symmetric Motor: Tone and bulk are normal. Power is full in both arms and legs. Reflexes are symmetric, no pathologic reflexes present.  Sensory: Intact to light touch Gait: Normal.   Labs: I have reviewed the data as listed    Component Value Date/Time   NA 140 03/06/2023 0957   NA 141 10/28/2022 0946   NA 140 03/28/2015 0957   K 4.8 03/06/2023 0957   K 3.9 03/28/2015 0957   CL 105 03/06/2023 0957   CL 104 03/28/2015 0957   CO2 29 03/06/2023 0957   CO2 26 03/28/2015 0957   GLUCOSE 91 03/06/2023 0957   GLUCOSE 124 (H) 03/28/2015 0957   BUN 13 03/06/2023 0957   BUN 16 10/28/2022 0946   BUN 14 03/28/2015 0957   CREATININE 0.97 03/06/2023 0957  CREATININE 1.1 03/28/2015 0957   CALCIUM 10.1 03/06/2023 0957   CALCIUM 9.0 03/28/2015 0957   PROT 7.1 03/06/2023 0957   PROT 6.9 10/28/2022 0946   PROT 7.2 03/28/2015 0957   ALBUMIN 4.2 03/06/2023 0957   ALBUMIN 4.5 10/28/2022 0946   AST 16 03/06/2023 0957   ALT 14 03/06/2023 0957   ALT 27 03/28/2015 0957   ALKPHOS 64 03/06/2023 0957   ALKPHOS 81 03/28/2015 0957   BILITOT 0.4 03/06/2023 0957   GFRNONAA >60 03/06/2023 0957   GFRAA >90 09/05/2011 0402   Lab Results  Component Value Date   WBC 7.0 03/06/2023   NEUTROABS 4.3 03/06/2023   HGB 16.1 03/06/2023   HCT 47.5 03/06/2023   MCV 88.1 03/06/2023   PLT 211 03/06/2023     Assessment/Plan Astrocytoma (HCC)  Seizure (HCC)  Jonahtan Manseau is clinically stable today, now having completed 4 cycles of oral Tibsovo for IDH mutant astrocytoma.  No new or progressive  changes.  Will con't Keppra (2000mg  BID ER) and Vimpat (200mg  BID) at this time.   He has appointment with [facility] next month, with MRI.  For astrocytoma, he will continue with cycle #5 Tibsovo 500mg  daily.  Next MRI will be performed following cycle #5.  Therapy should be held for the  following:  QTC greater than  Platelets less than 100,000  LFT or creatinine greater than 2x ULN  If clinical concerns/contraindications develop  We ask that Orlie Cundari return to clinic in 1 months following cycle #5 Tibsovo with MRI for evaluation, or sooner as needed.    All questions were answered. The patient knows to call the clinic with any problems, questions or concerns. No barriers to learning were detected.  The total time spent in the encounter was 30 minutes and more than 50% was on counseling and review of test results   Henreitta Leber, MD Medical Director of Neuro-Oncology [facility] 03/06/23 10:45 AM

## 2023-03-07 ENCOUNTER — Other Ambulatory Visit (HOSPITAL_COMMUNITY): Payer: Self-pay

## 2023-03-10 ENCOUNTER — Other Ambulatory Visit: Payer: Self-pay | Admitting: Internal Medicine

## 2023-03-10 ENCOUNTER — Other Ambulatory Visit: Payer: Self-pay

## 2023-03-10 ENCOUNTER — Other Ambulatory Visit (HOSPITAL_COMMUNITY): Payer: Self-pay

## 2023-03-10 DIAGNOSIS — C719 Malignant neoplasm of brain, unspecified: Secondary | ICD-10-CM

## 2023-03-10 MED ORDER — TIBSOVO 250 MG PO TABS
500.0000 mg | ORAL_TABLET | Freq: Every day | ORAL | 1 refills | Status: DC
Start: 2023-03-10 — End: 2023-05-13
  Filled 2023-03-10 – 2023-03-12 (×2): qty 60, 30d supply, fill #0
  Filled 2023-04-04: qty 60, 30d supply, fill #1

## 2023-03-11 ENCOUNTER — Other Ambulatory Visit: Payer: Self-pay

## 2023-03-12 ENCOUNTER — Other Ambulatory Visit: Payer: Self-pay

## 2023-03-12 ENCOUNTER — Other Ambulatory Visit (HOSPITAL_COMMUNITY): Payer: Self-pay

## 2023-03-20 ENCOUNTER — Telehealth: Payer: Self-pay | Admitting: Internal Medicine

## 2023-03-20 NOTE — Telephone Encounter (Signed)
Scheduled per 07/09 scheduling message, patient has been called and notified.

## 2023-04-02 ENCOUNTER — Other Ambulatory Visit (HOSPITAL_COMMUNITY): Payer: Self-pay

## 2023-04-03 ENCOUNTER — Inpatient Hospital Stay
Admission: RE | Admit: 2023-04-03 | Discharge: 2023-04-03 | Disposition: A | Discharge status: Home / Self Care | Payer: Self-pay | Source: Ambulatory Visit | Attending: Internal Medicine | Admitting: Internal Medicine

## 2023-04-03 ENCOUNTER — Telehealth: Payer: Self-pay | Admitting: *Deleted

## 2023-04-03 ENCOUNTER — Other Ambulatory Visit: Payer: Self-pay | Admitting: *Deleted

## 2023-04-03 DIAGNOSIS — C719 Malignant neoplasm of brain, unspecified: Secondary | ICD-10-CM

## 2023-04-03 NOTE — Telephone Encounter (Signed)
Requested that [facility] push images from brain MRI on 04/01/23 to [facility].

## 2023-04-04 ENCOUNTER — Other Ambulatory Visit (HOSPITAL_COMMUNITY): Payer: Self-pay

## 2023-04-04 ENCOUNTER — Other Ambulatory Visit: Payer: Self-pay

## 2023-04-07 ENCOUNTER — Ambulatory Visit: Payer: BC Managed Care – PPO | Admitting: Internal Medicine

## 2023-04-07 ENCOUNTER — Other Ambulatory Visit: Payer: Self-pay

## 2023-04-07 ENCOUNTER — Inpatient Hospital Stay: Payer: BC Managed Care – PPO | Attending: Internal Medicine | Admitting: Internal Medicine

## 2023-04-07 ENCOUNTER — Other Ambulatory Visit (HOSPITAL_COMMUNITY): Payer: Self-pay

## 2023-04-07 VITALS — BP 137/93 | HR 80 | Temp 97.7°F | Resp 14 | Ht 70.0 in | Wt 232.6 lb

## 2023-04-07 DIAGNOSIS — Z85828 Personal history of other malignant neoplasm of skin: Secondary | ICD-10-CM | POA: Insufficient documentation

## 2023-04-07 DIAGNOSIS — Z923 Personal history of irradiation: Secondary | ICD-10-CM | POA: Diagnosis not present

## 2023-04-07 DIAGNOSIS — C719 Malignant neoplasm of brain, unspecified: Secondary | ICD-10-CM | POA: Diagnosis not present

## 2023-04-07 DIAGNOSIS — Z79899 Other long term (current) drug therapy: Secondary | ICD-10-CM | POA: Insufficient documentation

## 2023-04-07 DIAGNOSIS — R569 Unspecified convulsions: Secondary | ICD-10-CM | POA: Diagnosis not present

## 2023-04-07 MED ORDER — LEVETIRACETAM ER 1000 MG PO TB24: 2000.0000 mg | ORAL_TABLET | Freq: Two times a day (BID) | ORAL | 2 refills | Status: AC

## 2023-04-07 MED ORDER — LACOSAMIDE 200 MG PO TABS: 200.0000 mg | ORAL_TABLET | Freq: Two times a day (BID) | ORAL | 2 refills | Status: DC

## 2023-04-07 NOTE — Progress Notes (Signed)
[facility] [address]  [place], [place] [address] [phone]   Interval Evaluation  Date of Service: 04/07/23 Patient Name: Nathan Prince Patient MRN: [id number] Patient DOB: 02-08-1966 Provider: Henreitta Leber, MD  Identifying Statement:  Nathan Prince is a 57 y.o. 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 Pathology: astrocytoma (WHO Grade II) per patient report.  10/02/2011 Surgery Craniotomy for gross total resection performed by Lavonna Monarch, MD. Pathology: anaplastic oligoastrocytoma (WHO Grade III).  - 12/31/2011 Radiation Radiographically stable after conclusion of radiation therapy with concurrent temozolomide 75 mg/m2 daily. Recommended proceeding with five day temozolomide, dosed at 150 mg/m2 for the first cycle and escalated to 200 mg/m2 for subsequent cycles if well tolerated.  01/01/2012 - 10/27/2012 Chemotherapy Eleven cycles of five day temozolomide. Hypometabolic PET scan. Recommended discontinuing temozolomide. Serial MRI monitoring initiated.  02/27/2021 Clinical Event-Other MRI on 02/27/21 reveals new very small area of enhancement medial to resection cavity best seen on axials, concerning for possible disease progression; note made of new left frontal enhancing lesion, which was not present on the 04/11/20 scan.  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 Lavonna Monarch for resection of tumor.  03/27/2021 Radiology Study MRI brain 4 week follow up scan shows growth of the left frontal lesion. Increased left frontal convexity enhancing dural based lesion measuring 1.9 cm AP by 1.0 cm TV by 2.0 cm CC (1.4 x 0.7 x 1.8 cm on 02/27/2021 exam). Recommend resection with Dr. Zachery Conch.  07/31/2021 Radiology Study MRI post radiation for left frontal meningioma and for left temporal oligoastrocytoma surveillance. These two sites are stable. Small enhancing focus medial to left temporal resection cavity is apparent on today's scan comparable to July 2022. Will follow up with a short term MRI in another 6 weeks.  Atypical meningioma of brain (CMS-HCC) 04/20/2021 Initial Diagnosis Atypical meningioma of brain (CMS-HCC)  04/20/2021 Surgery Resection of left frontal convexity mass with Lavonna Monarch. Pathology: atypical meningioma, WHO grade II. TERT negative.  05/31/2021 - 07/16/2021 Radiation IMRT to the left frontal convexity resected typical meningioma total dose 57.6 Gy  07/31/2021 Radiology Study MRI post radiation for left frontal meningioma and for left temporal oligoastrocytoma surveillance. These two sites are stable. Small enhancing focus medial to left temporal resection cavity is apparent on today's scan comparable to July 2022. Will follow up with a short term MRI in another 6 weeks.   Biomarkers:  MGMT Unknown.  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  EGFR Unknown  TERT Unknown   Interval History: Nathan Prince presents today for follow up, now having completed cycle #5 of oral Tibsovo.  No new or progressive changes.  Auras with metallic taste are stable.  He continues dosing Keppra as extended release following visit with epileptologist at [facility], he feels this has improved the mood side effects.  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    H+P (11/04/22) Patient presents today to establish care for local treatment of progressive grade 3 astrocytoma, IDHmt.  He and his wife describe decline in function, energy, cognition, language expression since his hospitalization in January for status epilepticus.  He is still teaching full time, and is having difficulty in class with communication and overall energy.  Currently dosing Keppra 2000mg  BID, Vimpat 200mg  BID, and Depakote 1000mg  BID.  Not on any steroids.  He will be proceeding with Tibsovo therapy per [facility] team recommendations.  Medications: Current Outpatient Medications on File Prior to Visit  Medication Sig Dispense Refill   Apoaequorin (PREVAGEN PO) Take 1 tablet by mouth daily.     Calcium Carb-Cholecalciferol (CALCIUM 600/VITAMIN D3 PO) Take 1 tablet by mouth 2 (two) times daily.     clonazePAM (KLONOPIN) 1 MG disintegrating tablet Take 1 mg by mouth daily as needed.     Cyanocobalamin (VITAMIN B 12 PO) Take 1 tablet by mouth 2 (two) times daily.     diazePAM, 20 MG Dose, (VALTOCO 20 MG DOSE) 2 x 10 MG/0.1ML LQPK Place 1 spray into both nostrils daily as needed for up to 5 doses (for seizure lasting over 5 minutes). (Patient not taking: Reported on 11/04/2022) 2 each 3   divalproex (DEPAKOTE) 250 MG DR tablet TAKE 1 TABLET BY MOUTH TWICE A DAY (Patient not taking: Reported on 03/06/2023) 180 tablet 1   ivosidenib (TIBSOVO) 250 MG tablet Take 2 tablets (500 mg total) by mouth daily. Avoid taking with a high-fat meal. 60 tablet 1   lacosamide (VIMPAT) 200 MG TABS tablet Take 1 tablet (200 mg total) by mouth 2 (two) times daily. 60 tablet 2   levETIRAcetam (KEPPRA XR) 500 MG 24 hr tablet Take 2,000 mg by mouth 2 (two) times daily.     levETIRAcetam (KEPPRA) 1000 MG tablet Take 2 tablets (2,000 mg total) by mouth 2 (two) times daily. (Patient not taking: Reported on 03/06/2023) 120 tablet 2   loratadine (CLARITIN) 10 MG tablet Take 10 mg by mouth daily as needed for allergies.      Multiple Vitamins-Minerals (MULTIVITAMINS THER. W/MINERALS) TABS Take 1 tablet by mouth daily.      prochlorperazine (COMPAZINE) 10 MG tablet Take 1 tablet (10 mg total) by mouth every 6 (six) hours as needed for nausea or vomiting. 30 tablet 2   Current Facility-Administered Medications on File Prior to Visit  Medication Dose Route Frequency Provider Last Rate Last Admin   topical emolient (BIAFINE) emulsion   Topical PRN Margaretmary Dys, MD   Given at 11/15/11 1401    Allergies: No Known Allergies Past Medical History:  Past Medical History:  Diagnosis Date   Astrocytoma (HCC) 09/17/2011   subsequent oligodendroglioma in 2022   Depression    Low testosterone 03/30/2013   Radiation 11/04/11-12/19/11   Left temporal brain 59.4 gray in 33 fractions   Skin cancer 09/09/1986   Past Surgical History:  Past Surgical History:  Procedure Laterality Date   BACK SURGERY  09/09/1994   ruptured disc   CRANIOTOMY  09/04/2011   Procedure: CRANIOTOMY TUMOR EXCISION;  Surgeon: Karn Cassis;  Location: [facility];  Service: Neurosurgery;  Laterality: Left;  Left Temporal craniectomy for tumor   MASS BIOPSY  09/05/2011   left temporal lobe   skin cancer removal  1988   Removed skin cancer to left chest   Social History:  Social History   Socioeconomic History  Marital status: Married    Spouse name: Not on file   Number of children: Not on file   Years of education: Not on file   Highest education level: Not on file  Occupational History   Occupation: history teacher at [employer]  Tobacco Use   Smoking status: Never   Smokeless tobacco: Never   Tobacco comments:    never used tobacco  Vaping Use   Vaping status: Never Used  Substance and Sexual Activity   Alcohol use: No    Alcohol/week: 0.0 standard drinks of alcohol   Drug use: No   Sexual activity: Not on file  Other Topics Concern   Not on file  Social History Narrative   Not on file   Social  Determinants of Health   Financial Resource Strain: Not on file  Food Insecurity: No Food Insecurity (09/23/2022)   Hunger Vital Sign    Worried About Running Out of Food in the Last Year: Never true    Ran Out of Food in the Last Year: Never true  Transportation Needs: No Transportation Needs (09/23/2022)   PRAPARE - Administrator, Civil Service (Medical): No    Lack of Transportation (Non-Medical): No  Physical Activity: Not on file  Stress: Not on file  Social Connections: Not on file  Intimate Partner Violence: Not At Risk (09/24/2022)   Humiliation, Afraid, Rape, and Kick questionnaire    Fear of Current or Ex-Partner: No    Emotionally Abused: No    Physically Abused: No    Sexually Abused: No   Family History: No family history on file.  Review of Systems: Constitutional: Doesn't report fevers, chills or abnormal weight loss Eyes: Doesn't report blurriness of vision Ears, nose, mouth, throat, and face: Doesn't report sore throat Respiratory: Doesn't report cough, dyspnea or wheezes Cardiovascular: Doesn't report palpitation, chest discomfort  Gastrointestinal:  Doesn't report nausea, constipation, diarrhea GU: Doesn't report incontinence Skin: Doesn't report skin rashes Neurological: Per HPI Musculoskeletal: Doesn't report joint pain Behavioral/Psych: Doesn't report anxiety  Physical Exam: Vitals:   04/07/23 1410  BP: (!) 137/93  Pulse: 80  Resp: 14  Temp: 97.7 F (36.5 C)  SpO2: 99%   KPS: 80. General: Alert, cooperative, pleasant, in no acute distress Head: Normal EENT: No conjunctival injection or scleral icterus.  Lungs: Resp effort normal Cardiac: Regular rate Abdomen: Non-distended abdomen Skin: No rashes cyanosis or petechiae. Extremities: No clubbing or edema  Neurologic Exam: Mental Status: Awake, alert, attentive to examiner. Oriented to self and environment. Language is modestly impaired with regards to fluency.  Age advanced  pyschomotor slowing Cranial Nerves: Visual acuity is grossly normal. Visual fields are full. Extra-ocular movements intact. No ptosis. Face is symmetric Motor: Tone and bulk are normal. Power is full in both arms and legs. Reflexes are symmetric, no pathologic reflexes present.  Sensory: Intact to light touch Gait: Normal.   Labs: I have reviewed the data as listed    Component Value Date/Time   NA 140 03/06/2023 0957   NA 141 10/28/2022 0946   NA 140 03/28/2015 0957   K 4.8 03/06/2023 0957   K 3.9 03/28/2015 0957   CL 105 03/06/2023 0957   CL 104 03/28/2015 0957   CO2 29 03/06/2023 0957   CO2 26 03/28/2015 0957   GLUCOSE 91 03/06/2023 0957   GLUCOSE 124 (H) 03/28/2015 0957   BUN 13 03/06/2023 0957   BUN 16 10/28/2022 0946   BUN 14 03/28/2015 0957  CREATININE 0.97 03/06/2023 0957   CREATININE 1.1 03/28/2015 0957   CALCIUM 10.1 03/06/2023 0957   CALCIUM 9.0 03/28/2015 0957   PROT 7.1 03/06/2023 0957   PROT 6.9 10/28/2022 0946   PROT 7.2 03/28/2015 0957   ALBUMIN 4.2 03/06/2023 0957   ALBUMIN 4.5 10/28/2022 0946   AST 16 03/06/2023 0957   ALT 14 03/06/2023 0957   ALT 27 03/28/2015 0957   ALKPHOS 64 03/06/2023 0957   ALKPHOS 81 03/28/2015 0957   BILITOT 0.4 03/06/2023 0957   GFRNONAA >60 03/06/2023 0957   GFRAA >90 09/05/2011 0402   Lab Results  Component Value Date   WBC 7.0 03/06/2023   NEUTROABS 4.3 03/06/2023   HGB 16.1 03/06/2023   HCT 47.5 03/06/2023   MCV 88.1 03/06/2023   PLT 211 03/06/2023    EXAM: MRI HEAD WO/W CONTRAST   HISTORY: 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   COMPARISON: Multiple prior head MRIs with the most recently performed  01/03/2023.   FINDINGS:  SURGICAL CHANGES: Expected changes related to craniotomy and tumor  resection.  ENHANCEMENT: Previously seen enhancing lesion within the medial temporal  lobe, deep to the resection cavity has  decreased in size and degree of  enhancement, now roughly measuring 7 x 4 mm in maximal axial dimensions  (series 9 image 60), previously 9 x 5 mm.. A focus of enhancement anterior  to the resection cavity (series 9 image 49) is less conspicuous. No  evidence of new enhancement.  T2-FLAIR: No substantial change in confluent T2/FLAIR hyperintensity  centered within the left temporal lobe and a left subinsular white matter.  DIFFUSION: No diffusion restriction.   ADDITIONAL FINDINGS:  PARENCHYMA:  No evidence of infarction. No acute parenchymal hemorrhage. Redemonstrated  nonspecific foci of T2/FLAIR hyperintensity within the supratentorial white  matter. No significant change in scattered foci of susceptibility artifact,  likely treatment-related.   EXTRA-AXIAL SPACES:  No extra-axial collection. No extra-axial mass.   VENTRICLES:  No hydrocephalus.   VESSELS:  The flow voids and intravascular enhancement are normal.   BONES:  Unremarkable aside from postsurgical changes.   ORBITS:  No significant abnormality. Bilateral lens replacements.   PARANASAL SINUSES/MASTOID AIR CELLS:  Predominantly clear.   EXTRACRANIAL SOFT TISSUES:  Unremarkable.   IMPRESSION:  Previously described enhancing lesion within the medial left temporal lobe  has slightly decreased in size and enhancement. Areas of confluent T2/FLAIR  hyperintensity about this lesion and about the left temporal resection  cavity are not significantly changed. Overall findings are suggestive of  decreasing tumor burden and/or improving treatment effect. Brain tumor  follow-up score 1a-imaging improvement.   Electronically Signed by:  Kathrynn Humble, MD, [facility] Radiology  Electronically Signed on:  04/01/2023 12:37 PM   Assessment/Plan Astrocytoma [facility])  Seizure (HCC)  Deuntay Friesen is clinically stable today, now having completed 5 cycles of oral Tibsovo for IDH mutant astrocytoma.  MRI brain demonstrates very  encouraging response to treatment, with some contraction in signal abnormality.  Will con't Keppra (2000mg  BID ER) and Vimpat (200mg  BID) at this time.   For astrocytoma, he will continue with cycle #6 Tibsovo 500mg  daily.  Next MRI will be performed following cycle #8.  Therapy should be held for the following:  QTC greater than  Platelets less than 100,000  LFT or creatinine greater than 2x ULN  If clinical concerns/contraindications develop  We ask that Brackston Marciante return to clinic in  1 months following cycle #6 Tibsovo with labs for evaluation, or sooner as needed.    All questions were answered. The patient knows to call the clinic with any problems, questions or concerns. No barriers to learning were detected.  The total time spent in the encounter was 30 minutes and more than 50% was on counseling and review of test results   Henreitta Leber, MD Medical Director of Neuro-Oncology [facility] at [place] 04/07/23 2:08 PM

## 2023-04-26 ENCOUNTER — Telehealth: Payer: Self-pay | Admitting: Internal Medicine

## 2023-04-29 ENCOUNTER — Other Ambulatory Visit (HOSPITAL_COMMUNITY): Payer: Self-pay

## 2023-05-02 ENCOUNTER — Other Ambulatory Visit: Payer: Self-pay | Admitting: *Deleted

## 2023-05-02 DIAGNOSIS — C719 Malignant neoplasm of brain, unspecified: Secondary | ICD-10-CM

## 2023-05-05 ENCOUNTER — Inpatient Hospital Stay: Payer: BC Managed Care – PPO | Admitting: Internal Medicine

## 2023-05-05 ENCOUNTER — Inpatient Hospital Stay: Payer: BC Managed Care – PPO

## 2023-05-06 ENCOUNTER — Telehealth: Payer: Self-pay | Admitting: Internal Medicine

## 2023-05-06 NOTE — Telephone Encounter (Signed)
Called patient to reschedule cancelled appointment, left a voicemail.

## 2023-05-09 ENCOUNTER — Other Ambulatory Visit: Payer: Self-pay

## 2023-05-13 ENCOUNTER — Other Ambulatory Visit: Payer: Self-pay | Admitting: Internal Medicine

## 2023-05-13 ENCOUNTER — Telehealth: Payer: Self-pay | Admitting: *Deleted

## 2023-05-13 ENCOUNTER — Other Ambulatory Visit: Payer: Self-pay

## 2023-05-13 ENCOUNTER — Other Ambulatory Visit (HOSPITAL_COMMUNITY): Payer: Self-pay

## 2023-05-13 DIAGNOSIS — C719 Malignant neoplasm of brain, unspecified: Secondary | ICD-10-CM

## 2023-05-13 MED ORDER — TIBSOVO 250 MG PO TABS
500.0000 mg | ORAL_TABLET | Freq: Every day | ORAL | 1 refills | Status: DC
Start: 2023-05-13 — End: 2023-07-01
  Filled 2023-05-13: qty 60, 30d supply, fill #0
  Filled 2023-06-05: qty 60, 30d supply, fill #1

## 2023-05-13 NOTE — Telephone Encounter (Signed)
Patient needs Tibsovo refill routed to his pharmacy.  He next follow up is planned for 05/19/2023 lab and 05/20/2023 MD visit.

## 2023-05-14 ENCOUNTER — Other Ambulatory Visit (HOSPITAL_COMMUNITY): Payer: Self-pay

## 2023-05-19 ENCOUNTER — Other Ambulatory Visit: Payer: Self-pay

## 2023-05-19 ENCOUNTER — Inpatient Hospital Stay: Payer: BC Managed Care – PPO | Attending: Internal Medicine

## 2023-05-19 DIAGNOSIS — C719 Malignant neoplasm of brain, unspecified: Secondary | ICD-10-CM | POA: Diagnosis present

## 2023-05-19 LAB — CMP (CANCER CENTER ONLY)
ALT: 19 U/L (ref 0–44)
AST: 19 U/L (ref 15–41)
Albumin: 4.4 g/dL (ref 3.5–5.0)
Alkaline Phosphatase: 68 U/L (ref 38–126)
Anion gap: 7 (ref 5–15)
BUN: 15 mg/dL (ref 6–20)
CO2: 28 mmol/L (ref 22–32)
Calcium: 9.8 mg/dL (ref 8.9–10.3)
Chloride: 106 mmol/L (ref 98–111)
Creatinine: 1.04 mg/dL (ref 0.61–1.24)
GFR, Estimated: >60 mL/min (ref >60)
Glucose, Bld: 98 mg/dL (ref 70–99)
Potassium: 4.1 mmol/L (ref 3.5–5.1)
Sodium: 141 mmol/L (ref 135–145)
Total Bilirubin: 0.4 mg/dL (ref 0.3–1.2)
Total Protein: 7 g/dL (ref 6.5–8.1)

## 2023-05-19 LAB — CBC WITH DIFFERENTIAL (CANCER CENTER ONLY)
Abs Immature Granulocytes: 0.03 10*3/uL (ref 0.00–0.07)
Basophils Absolute: 0 10*3/uL (ref 0.0–0.1)
Basophils Relative: 1 %
Eosinophils Absolute: 0.1 10*3/uL (ref 0.0–0.5)
Eosinophils Relative: 1 %
HCT: 48.4 % (ref 39.0–52.0)
Hemoglobin: 16.2 g/dL (ref 13.0–17.0)
Immature Granulocytes: 0 %
Lymphocytes Relative: 28 %
Lymphs Abs: 1.9 10*3/uL (ref 0.7–4.0)
MCH: 29.7 pg (ref 26.0–34.0)
MCHC: 33.5 g/dL (ref 30.0–36.0)
MCV: 88.6 fL (ref 80.0–100.0)
Monocytes Absolute: 0.5 10*3/uL (ref 0.1–1.0)
Monocytes Relative: 8 %
Neutro Abs: 4.4 10*3/uL (ref 1.7–7.7)
Neutrophils Relative %: 62 %
Platelet Count: 231 10*3/uL (ref 150–400)
RBC: 5.46 MIL/uL (ref 4.22–5.81)
RDW: 12.2 % (ref 11.5–15.5)
WBC Count: 7 10*3/uL (ref 4.0–10.5)
nRBC: 0 % (ref 0.0–0.2)

## 2023-05-20 ENCOUNTER — Ambulatory Visit: Payer: BC Managed Care – PPO | Admitting: Internal Medicine

## 2023-05-27 ENCOUNTER — Inpatient Hospital Stay (HOSPITAL_BASED_OUTPATIENT_CLINIC_OR_DEPARTMENT_OTHER): Payer: BC Managed Care – PPO | Admitting: Internal Medicine

## 2023-05-27 DIAGNOSIS — R569 Unspecified convulsions: Secondary | ICD-10-CM

## 2023-05-27 DIAGNOSIS — C719 Malignant neoplasm of brain, unspecified: Secondary | ICD-10-CM | POA: Diagnosis not present

## 2023-05-27 NOTE — Progress Notes (Signed)
I connected with Rondall Allegra on 05/27/23 at 10:30 AM EDT by telephone visit and verified that I am speaking with the correct person using two identifiers.  I discussed the limitations, risks, security and privacy concerns of performing an evaluation and management service by telemedicine and the availability of in-person appointments. I also discussed with the patient that there may be a patient responsible charge related to this service. The patient expressed understanding and agreed to proceed.  Other persons participating in the visit and their role in the encounter:  n/a   Patient's location:  Work Provider's location:  Consulting civil engineer Complaint:  Astrocytoma [facility])  Seizure (HCC)  History of Present Ilness: Blakley Laverde reports no clinical changes today.  Denies seizures, headaches.  Continues to do well with the Tibsovo.  Has [facility] MRI and appt scheduled for 06/17/23.  Observations: Language and cognition at baseline  Lab Results  Component Value Date   WBC 7.0 05/19/2023   HGB 16.2 05/19/2023   HCT 48.4 05/19/2023   MCV 88.6 05/19/2023   PLT 231 05/19/2023    Assessment and Plan: Astrocytoma (HCC)  Seizure (HCC)  Clinically stable, labs WNL.  Will con't Tibsovo 500mg .  Con't Vimpat, Keppra as prior.  Follow Up Instructions: RTC in 1 month with [facility] MRI for review, labs  I discussed the assessment and treatment plan with the patient.  The patient was provided an opportunity to ask questions and all were answered.  The patient agreed with the plan and demonstrated understanding of the instructions.    The patient was advised to call back or seek an in-person evaluation if the symptoms worsen or if the condition fails to improve as anticipated.    Henreitta Leber, MD   I provided 22 minutes of non face-to-face telephone visit time during this encounter, and > 50% was spent counseling as documented under my assessment & plan.

## 2023-05-28 ENCOUNTER — Other Ambulatory Visit: Payer: Self-pay

## 2023-06-05 ENCOUNTER — Other Ambulatory Visit: Payer: Self-pay | Admitting: Pharmacy Technician

## 2023-06-05 ENCOUNTER — Other Ambulatory Visit: Payer: Self-pay

## 2023-06-05 NOTE — Progress Notes (Signed)
Specialty Pharmacy Refill Coordination Note  Nathan Prince is a 57 y.o. male contacted today regarding refills of specialty medication(s) Ivosidenib .  Patient requested [name] at [facility] at [place]  on 06/11/23   Medication will be filled on 06/10/23.

## 2023-06-09 ENCOUNTER — Other Ambulatory Visit: Payer: Self-pay

## 2023-06-11 ENCOUNTER — Telehealth: Payer: Self-pay | Admitting: Internal Medicine

## 2023-06-11 NOTE — Telephone Encounter (Signed)
Per IB message; I called the patient and scheduled appointment. The patient is aware of the date and time.

## 2023-06-13 ENCOUNTER — Other Ambulatory Visit (HOSPITAL_COMMUNITY): Payer: Self-pay

## 2023-06-23 ENCOUNTER — Other Ambulatory Visit: Payer: Self-pay

## 2023-06-23 ENCOUNTER — Other Ambulatory Visit: Payer: Self-pay | Admitting: *Deleted

## 2023-06-23 ENCOUNTER — Inpatient Hospital Stay
Admission: RE | Admit: 2023-06-23 | Discharge: 2023-06-23 | Disposition: A | Discharge status: Home / Self Care | Payer: Self-pay | Source: Ambulatory Visit | Attending: Internal Medicine | Admitting: Internal Medicine

## 2023-06-23 DIAGNOSIS — R569 Unspecified convulsions: Secondary | ICD-10-CM

## 2023-06-23 DIAGNOSIS — C719 Malignant neoplasm of brain, unspecified: Secondary | ICD-10-CM

## 2023-06-23 NOTE — Progress Notes (Signed)
Requested MRI from [facility].

## 2023-06-24 ENCOUNTER — Inpatient Hospital Stay: Payer: BC Managed Care – PPO | Attending: Internal Medicine

## 2023-06-24 ENCOUNTER — Inpatient Hospital Stay: Payer: BC Managed Care – PPO | Attending: Internal Medicine | Admitting: Internal Medicine

## 2023-06-24 VITALS — BP 137/95 | HR 77 | Temp 98.1°F | Resp 20 | Wt 225.0 lb

## 2023-06-24 DIAGNOSIS — R569 Unspecified convulsions: Secondary | ICD-10-CM | POA: Diagnosis not present

## 2023-06-24 DIAGNOSIS — C719 Malignant neoplasm of brain, unspecified: Secondary | ICD-10-CM

## 2023-06-24 DIAGNOSIS — Z79899 Other long term (current) drug therapy: Secondary | ICD-10-CM | POA: Insufficient documentation

## 2023-06-24 DIAGNOSIS — Z923 Personal history of irradiation: Secondary | ICD-10-CM | POA: Diagnosis not present

## 2023-06-24 DIAGNOSIS — Z79630 Long term (current) use of alkylating agent: Secondary | ICD-10-CM | POA: Diagnosis not present

## 2023-06-24 LAB — CBC WITH DIFFERENTIAL (CANCER CENTER ONLY)
Abs Immature Granulocytes: 0.01 10*3/uL (ref 0.00–0.07)
Basophils Absolute: 0 10*3/uL (ref 0.0–0.1)
Basophils Relative: 1 %
Eosinophils Absolute: 0.1 10*3/uL (ref 0.0–0.5)
Eosinophils Relative: 1 %
HCT: 46.3 % (ref 39.0–52.0)
Hemoglobin: 15.9 g/dL (ref 13.0–17.0)
Immature Granulocytes: 0 %
Lymphocytes Relative: 23 %
Lymphs Abs: 1.6 10*3/uL (ref 0.7–4.0)
MCH: 30.1 pg (ref 26.0–34.0)
MCHC: 34.3 g/dL (ref 30.0–36.0)
MCV: 87.5 fL (ref 80.0–100.0)
Monocytes Absolute: 0.5 10*3/uL (ref 0.1–1.0)
Monocytes Relative: 8 %
Neutro Abs: 4.6 10*3/uL (ref 1.7–7.7)
Neutrophils Relative %: 67 %
Platelet Count: 253 10*3/uL (ref 150–400)
RBC: 5.29 MIL/uL (ref 4.22–5.81)
RDW: 12.3 % (ref 11.5–15.5)
WBC Count: 6.8 10*3/uL (ref 4.0–10.5)
nRBC: 0 % (ref 0.0–0.2)

## 2023-06-24 LAB — CMP (CANCER CENTER ONLY)
ALT: 16 U/L (ref 0–44)
AST: 17 U/L (ref 15–41)
Albumin: 4.3 g/dL (ref 3.5–5.0)
Alkaline Phosphatase: 67 U/L (ref 38–126)
Anion gap: 8 (ref 5–15)
BUN: 17 mg/dL (ref 6–20)
CO2: 25 mmol/L (ref 22–32)
Calcium: 9.6 mg/dL (ref 8.9–10.3)
Chloride: 106 mmol/L (ref 98–111)
Creatinine: 0.96 mg/dL (ref 0.61–1.24)
GFR, Estimated: >60 mL/min (ref >60)
Glucose, Bld: 122 mg/dL — ABNORMAL HIGH (ref 70–99)
Potassium: 4 mmol/L (ref 3.5–5.1)
Sodium: 139 mmol/L (ref 135–145)
Total Bilirubin: 0.5 mg/dL (ref 0.3–1.2)
Total Protein: 6.8 g/dL (ref 6.5–8.1)

## 2023-06-24 NOTE — Progress Notes (Signed)
[facility] at [facility] [address]  [place], [place] [address] [phone]   Interval Evaluation  Date of Service: 06/24/23 Patient Name: Nathan Prince Patient MRN: [id number] Patient DOB: March 22, 1966 Provider: Henreitta Leber, MD  Identifying Statement:  Nathan Prince is a 57 y.o. 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 Pathology: astrocytoma (WHO Grade II) per patient report.  10/02/2011 Surgery Craniotomy for gross total resection performed by Lavonna Monarch, MD. Pathology: anaplastic oligoastrocytoma (WHO Grade III).  - 12/31/2011 Radiation Radiographically stable after conclusion of radiation therapy with concurrent temozolomide 75 mg/m2 daily. Recommended proceeding with five day temozolomide, dosed at 150 mg/m2 for the first cycle and escalated to 200 mg/m2 for subsequent cycles if well tolerated.  01/01/2012 - 10/27/2012 Chemotherapy Eleven cycles of five day temozolomide. Hypometabolic PET scan. Recommended discontinuing temozolomide. Serial MRI monitoring initiated.  02/27/2021 Clinical Event-Other MRI on 02/27/21 reveals new very small area of enhancement medial to resection cavity best seen on axials, concerning for possible disease progression; note made of new left frontal enhancing lesion, which was not present on the 04/11/20 scan.  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 Lavonna Monarch for resection of tumor.  03/27/2021 Radiology Study MRI brain 4 week follow up scan shows growth of the left frontal lesion. Increased left frontal convexity enhancing dural based lesion measuring 1.9 cm AP by 1.0 cm TV by 2.0 cm CC (1.4 x 0.7 x 1.8 cm on 02/27/2021 exam). Recommend resection with Dr. Zachery Conch.  07/31/2021 Radiology Study MRI post radiation for left frontal meningioma and for left temporal oligoastrocytoma surveillance. These two sites are stable. Small enhancing focus medial to left temporal resection cavity is apparent on today's scan comparable to July 2022. Will follow up with a short term MRI in another 6 weeks.  Atypical meningioma of brain (CMS-HCC) 04/20/2021 Initial Diagnosis Atypical meningioma of brain (CMS-HCC)  04/20/2021 Surgery Resection of left frontal convexity mass with Lavonna Monarch. Pathology: atypical meningioma, WHO grade II. TERT negative.  05/31/2021 - 07/16/2021 Radiation IMRT to the left frontal convexity resected typical meningioma total dose 57.6 Gy  07/31/2021 Radiology Study MRI post radiation for left frontal meningioma and for left temporal oligoastrocytoma surveillance. These two sites are stable. Small enhancing focus medial to left temporal resection cavity is apparent on today's scan comparable to July 2022. Will follow up with a short term MRI in another 6 weeks.  11/2022 - Chemotherapy  Tibsovo   Biomarkers:  MGMT Unknown.  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  EGFR Unknown  TERT Unknown   Interval History: Nathan Prince presents today for follow up, now having completed cycle #7 of oral Tibsovo.  No new or progressive changes.  Auras with metallic taste are stable.  Nathan has not yet obtained the onfi recommended by [facility] epilepsy team.  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  H+P (11/04/22) Patient presents today to  establish care for local treatment of progressive grade 3 astrocytoma, IDHmt.  Nathan Prince describe decline in function, energy, cognition, language expression since his hospitalization in January for status epilepticus.  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  Currently dosing Keppra 2000mg  BID, Vimpat 200mg  BID, and Depakote 1000mg  BID.  Not on any steroids.  Nathan will be proceeding with Tibsovo therapy per [facility] team recommendations.  Medications: Current Outpatient Medications on File Prior to Visit  Medication Sig Dispense Refill   Apoaequorin (PREVAGEN PO) Take 1 tablet by mouth daily.     Calcium Carb-Cholecalciferol (CALCIUM 600/VITAMIN D3 PO) Take 1 tablet by mouth 2 (two) times daily.     clonazePAM (KLONOPIN) 1 MG disintegrating tablet Take 1 mg by mouth daily as needed.     diazePAM, 20 MG Dose, (VALTOCO 20 MG DOSE) 2 x 10 MG/0.1ML LQPK Place 1 spray into both nostrils daily as needed for up to 5 doses (for seizure lasting over 5 minutes). (Patient not taking: Reported on 11/04/2022) 2 each 3   ivosidenib (TIBSOVO) 250 MG tablet Take 2 tablets (500 mg total) by mouth daily. Avoid taking with a high-fat meal. 60 tablet 1   lacosamide (VIMPAT) 200 MG TABS tablet Take 1 tablet (200 mg total) by mouth 2 (two) times daily. 60 tablet 2   levETIRAcetam 1000 MG TB24 Take 2,000 mg by mouth 2 (two) times daily. 112 tablet 2   loratadine (CLARITIN) 10 MG tablet Take 10 mg by mouth daily as needed for allergies.     Multiple Vitamins-Minerals (MULTIVITAMINS THER. W/MINERALS) TABS Take 1 tablet by mouth daily.      prochlorperazine (COMPAZINE) 10 MG tablet Take 1 tablet (10 mg total) by mouth every 6 (six) hours as needed for nausea or vomiting. (Patient not taking: Reported on 04/07/2023) 30 tablet 2   pyridoxine (B-6) 100 MG tablet Take by mouth.     Current Facility-Administered Medications on File Prior to Visit  Medication Dose  Route Frequency Provider Last Rate Last Admin   topical emolient (BIAFINE) emulsion   Topical PRN Margaretmary Dys, MD   Given at 11/15/11 1401    Allergies: No Known Allergies Past Medical History:  Past Medical History:  Diagnosis Date   Astrocytoma (HCC) 09/17/2011   subsequent oligodendroglioma in 2022   Depression    Low testosterone 03/30/2013   Radiation 11/04/11-12/19/11   Left temporal brain 59.4 gray in 33 fractions   Skin cancer 09/09/1986   Past Surgical History:  Past Surgical History:  Procedure Laterality Date   BACK SURGERY  09/09/1994   ruptured disc   CRANIOTOMY  09/04/2011   Procedure: CRANIOTOMY TUMOR EXCISION;  Surgeon: Karn Cassis;  Location: [facility];  Service: Neurosurgery;  Laterality: Left;  Left Temporal craniectomy for tumor   MASS BIOPSY  09/05/2011   left temporal lobe   skin cancer removal  1988   Removed skin cancer to left chest   Social History:  Social History   Socioeconomic History   Marital status: Married    Spouse name: Not on file   Number of children: Not on file   Years of education: Not on file   Highest education level: Not on file  Occupational History   Occupation: history teacher at [employer]  Tobacco Use   Smoking status: Never   Smokeless tobacco: Never   Tobacco comments:  never used tobacco  Vaping Use   Vaping status: Never Used  Substance and Sexual Activity   Alcohol use: No    Alcohol/week: 0.0 standard drinks of alcohol   Drug use: No   Sexual activity: Not on file  Other Topics Concern   Not on file  Social History Narrative   Not on file   Social Determinants of Health   Financial Resource Strain: Not on file  Food Insecurity: No Food Insecurity (09/23/2022)   Hunger Vital Sign    Worried About Running Out of Food in the Last Year: Never true    Ran Out of Food in the Last Year: Never true  Transportation Needs: No Transportation Needs (09/23/2022)   PRAPARE - Doctor, general practice (Medical): No    Lack of Transportation (Non-Medical): No  Physical Activity: Not on file  Stress: Not on file  Social Connections: Not on file  Intimate Partner Violence: Not At Risk (09/24/2022)   Humiliation, Afraid, Rape, and Kick questionnaire    Fear of Current or Ex-Partner: No    Emotionally Abused: No    Physically Abused: No    Sexually Abused: No   Family History: No family history on file.  Review of Systems: Constitutional: Doesn't report fevers, chills or abnormal weight loss Eyes: Doesn't report blurriness of vision Ears, nose, mouth, throat, and face: Doesn't report sore throat Respiratory: Doesn't report cough, dyspnea or wheezes Cardiovascular: Doesn't report palpitation, chest discomfort  Gastrointestinal:  Doesn't report nausea, constipation, diarrhea GU: Doesn't report incontinence Skin: Doesn't report skin rashes Neurological: Per HPI Musculoskeletal: Doesn't report joint pain Behavioral/Psych: Doesn't report anxiety  Physical Exam: There were no vitals filed for this visit.  KPS: 80. General: Alert, cooperative, pleasant, in no acute distress Head: Normal EENT: No conjunctival injection or scleral icterus.  Lungs: Resp effort normal Cardiac: Regular rate Abdomen: Non-distended abdomen Skin: No rashes cyanosis or petechiae. Extremities: No clubbing or edema  Neurologic Exam: Mental Status: Awake, alert, attentive to examiner. Oriented to self and environment. Language is modestly impaired with regards to fluency.  Age advanced pyschomotor slowing Cranial Nerves: Visual acuity is grossly normal. Visual fields are full. Extra-ocular movements intact. No ptosis. Face is symmetric Motor: Tone and bulk are normal. Power is full in both arms and legs. Reflexes are symmetric, no pathologic reflexes present.  Sensory: Intact to light touch Gait: Normal.   Labs: I have reviewed the data as listed    Component Value Date/Time   NA 141  05/19/2023 0739   NA 141 10/28/2022 0946   NA 140 03/28/2015 0957   K 4.1 05/19/2023 0739   K 3.9 03/28/2015 0957   CL 106 05/19/2023 0739   CL 104 03/28/2015 0957   CO2 28 05/19/2023 0739   CO2 26 03/28/2015 0957   GLUCOSE 98 05/19/2023 0739   GLUCOSE 124 (H) 03/28/2015 0957   BUN 15 05/19/2023 0739   BUN 16 10/28/2022 0946   BUN 14 03/28/2015 0957   CREATININE 1.04 05/19/2023 0739   CREATININE 1.1 03/28/2015 0957   CALCIUM 9.8 05/19/2023 0739   CALCIUM 9.0 03/28/2015 0957   PROT 7.0 05/19/2023 0739   PROT 6.9 10/28/2022 0946   PROT 7.2 03/28/2015 0957   ALBUMIN 4.4 05/19/2023 0739   ALBUMIN 4.5 10/28/2022 0946   AST 19 05/19/2023 0739   ALT 19 05/19/2023 0739   ALT 27 03/28/2015 0957   ALKPHOS 68 05/19/2023 0739   ALKPHOS 81 03/28/2015 0957  BILITOT 0.4 05/19/2023 0739   GFRNONAA >60 05/19/2023 0739   GFRAA >90 09/05/2011 0402   Lab Results  Component Value Date   WBC 6.8 06/24/2023   NEUTROABS 4.6 06/24/2023   HGB 15.9 06/24/2023   HCT 46.3 06/24/2023   MCV 87.5 06/24/2023   PLT 253 06/24/2023    EXAM: MRI HEAD WO/W CONTRAST   HISTORY: 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   COMPARISON: Multiple prior head MRIs with the most recently performed  01/03/2023.   FINDINGS:  SURGICAL CHANGES: Expected changes related to craniotomy and tumor  resection.  ENHANCEMENT: Previously seen enhancing lesion within the medial temporal  lobe, deep to the resection cavity has decreased in size and degree of  enhancement, now roughly measuring 7 x 4 mm in maximal axial dimensions  (series 9 image 60), previously 9 x 5 mm.. A focus of enhancement anterior  to the resection cavity (series 9 image 49) is less conspicuous. No  evidence of new enhancement.  T2-FLAIR: No substantial change in confluent T2/FLAIR hyperintensity  centered within the left temporal lobe and a left subinsular  white matter.  DIFFUSION: No diffusion restriction.   ADDITIONAL FINDINGS:  PARENCHYMA:  No evidence of infarction. No acute parenchymal hemorrhage. Redemonstrated  nonspecific foci of T2/FLAIR hyperintensity within the supratentorial white  matter. No significant change in scattered foci of susceptibility artifact,  likely treatment-related.   EXTRA-AXIAL SPACES:  No extra-axial collection. No extra-axial mass.   VENTRICLES:  No hydrocephalus.   VESSELS:  The flow voids and intravascular enhancement are normal.   BONES:  Unremarkable aside from postsurgical changes.   ORBITS:  No significant abnormality. Bilateral lens replacements.   PARANASAL SINUSES/MASTOID AIR CELLS:  Predominantly clear.   EXTRACRANIAL SOFT TISSUES:  Unremarkable.   IMPRESSION:  Previously described enhancing lesion within the medial left temporal lobe  has slightly decreased in size and enhancement. Areas of confluent T2/FLAIR  hyperintensity about this lesion and about the left temporal resection  cavity are not significantly changed. Overall findings are suggestive of  decreasing tumor burden and/or improving treatment effect. Brain tumor  follow-up score 1a-imaging improvement.   Electronically Signed by:  Kathrynn Humble, MD, [facility]  Electronically Signed on:  04/01/2023 12:37 PM   Assessment/Plan Astrocytoma [facility])  Seizure (HCC)  Joshuea Poovey is clinically stable today, now having completed 7 cycles of oral Tibsovo for IDH mutant astrocytoma.  MRI brain demonstrates stable findings, with ongoing contraction in mesial left temporal enhancement.  Will con't Keppra (2000mg  BID ER) and Vimpat (200mg  BID) at this time.  Once Onfi 5mg  HS is obtained Nathan will give it a try.   For astrocytoma, Nathan will continue with cycle #8 Tibsovo 500mg  daily.  Next MRI will be performed following cycle #10.  Therapy should be held for the following:  QTC greater than  Platelets less than  100,000  LFT or creatinine greater than 2x ULN  If clinical concerns/contraindications develop  We ask that Jamikal Hommes return to clinic in 1 months following cycle #8 Tibsovo with phone visit, or sooner as needed.  Next in person visit with labs in 2 months.   All questions were answered. The patient knows to call the clinic with any problems, questions or concerns. No barriers to learning were detected.  The total time spent in the encounter was 30 minutes and more than 50% was on counseling and review of test results  Henreitta Leber, MD Medical Director of Neuro-Oncology [facility] at [facility] 06/24/23 8:57 AM

## 2023-07-01 ENCOUNTER — Other Ambulatory Visit: Payer: Self-pay

## 2023-07-01 ENCOUNTER — Other Ambulatory Visit: Payer: Self-pay | Admitting: Internal Medicine

## 2023-07-01 DIAGNOSIS — C719 Malignant neoplasm of brain, unspecified: Secondary | ICD-10-CM

## 2023-07-01 MED ORDER — TIBSOVO 250 MG PO TABS
500.0000 mg | ORAL_TABLET | Freq: Every day | ORAL | 1 refills | Status: DC
  Filled 2023-07-01: qty 60, 30d supply, fill #0
  Filled 2023-07-25: qty 60, 30d supply, fill #1

## 2023-07-01 NOTE — Progress Notes (Signed)
Specialty Pharmacy Refill Coordination Note  Nathan Prince is a 57 y.o. male contacted today regarding refills of specialty medication(s) Ivosidenib   Patient requested Delivery   Delivery date: 07/08/23   Verified address: [address]  [name] [address]   Medication will be filled on 07/07/23 pending a refill request.

## 2023-07-22 ENCOUNTER — Inpatient Hospital Stay: Payer: BC Managed Care – PPO | Attending: Internal Medicine | Admitting: Internal Medicine

## 2023-07-22 DIAGNOSIS — C719 Malignant neoplasm of brain, unspecified: Secondary | ICD-10-CM

## 2023-07-22 DIAGNOSIS — R569 Unspecified convulsions: Secondary | ICD-10-CM | POA: Diagnosis not present

## 2023-07-22 MED ORDER — CLOBAZAM 10 MG PO TABS: 10.0000 mg | ORAL_TABLET | Freq: Every evening | ORAL | 1 refills | Status: DC

## 2023-07-22 NOTE — Progress Notes (Signed)
I connected with Rondall Allegra on 07/22/23 at  9:30 AM EST by telephone visit and verified that I am speaking with the correct person using two identifiers.   I discussed the limitations, risks, security and privacy concerns of performing an evaluation and management service by telemedicine and the availability of in-person appointments. I also discussed with the patient that there may be a patient responsible charge related to this service. The patient expressed understanding and agreed to proceed.   Other persons participating in the visit and their role in the encounter:  n/a   Patient's location:  Home Provider's location:  Office Chief Complaint:  Astrocytoma [facility])  Seizure (HCC)  History of Present Ilness: Dagim Tousignant reports initial improvement in seizure burden since starting the Onfi 5mg  at night.  In recent days, however, frequency has increased to near daily episodes (still staring spells).  No appreciable side effects from the new medicine.  Continues to tolerate the Tibsovo well.  Still working/teaching.  Observations: Language and cognition at baseline  Assessment and Plan: Astrocytoma (HCC)  Seizure (HCC)  Recommended increasing Onfi to 10mg  HS given early efficacy and tolerability.    Will con't Keppra (2000mg  BID ER) and Vimpat (200mg  BID) at this time.     For astrocytoma, he will continue with cycle #9 Tibsovo 500mg  daily.  Next MRI will be performed following cycle #10.  Follow Up Instructions: We ask that Nevaan Aherne return to clinic in 1 months with labs, or sooner as needed.  I discussed the assessment and treatment plan with the patient.  The patient was provided an opportunity to ask questions and all were answered.  The patient agreed with the plan and demonstrated understanding of the instructions.    The patient was advised to call back or seek an in-person evaluation if the symptoms worsen or if the condition fails to improve as anticipated.     Henreitta Leber, MD   I provided 22 minutes of non face-to-face telephone visit time during this encounter, and > 50% was spent counseling as documented under my assessment & plan.

## 2023-07-24 ENCOUNTER — Other Ambulatory Visit: Payer: Self-pay

## 2023-07-24 ENCOUNTER — Other Ambulatory Visit: Payer: Self-pay | Admitting: Internal Medicine

## 2023-07-25 ENCOUNTER — Other Ambulatory Visit: Payer: Self-pay

## 2023-07-25 NOTE — Progress Notes (Signed)
Specialty Pharmacy Refill Coordination Note  Nathan Prince is a 57 y.o. male contacted today regarding refills of specialty medication(s) Ivosidenib   Patient requested Delivery   Delivery date: 08/04/23   Verified address: [address]  [name] [address]   Medication will be filled on 08/01/23.

## 2023-07-30 ENCOUNTER — Telehealth: Payer: Self-pay | Admitting: Internal Medicine

## 2023-07-30 NOTE — Telephone Encounter (Signed)
Patient stated that due to other commitments they would not be able to schedule now at the moment, patient stated that after his visit with [facility] that he would call back to schedule future visits; left callback number when ready for scheduling

## 2023-08-01 ENCOUNTER — Other Ambulatory Visit: Payer: Self-pay

## 2023-08-13 ENCOUNTER — Other Ambulatory Visit: Payer: Self-pay

## 2023-08-25 ENCOUNTER — Other Ambulatory Visit: Payer: Self-pay

## 2023-08-29 ENCOUNTER — Other Ambulatory Visit: Payer: Self-pay

## 2023-08-29 ENCOUNTER — Other Ambulatory Visit: Payer: Self-pay | Admitting: Internal Medicine

## 2023-08-29 DIAGNOSIS — C719 Malignant neoplasm of brain, unspecified: Secondary | ICD-10-CM

## 2023-08-29 NOTE — Progress Notes (Signed)
Specialty Pharmacy Refill Coordination Note  Nathan Prince is a 57 y.o. male contacted today regarding refills of specialty medication(s) Ivosidenib (Tibsovo)   Patient requested Delivery   Delivery date: 09/02/23   Verified address: [address]  [name] [address]   Medication will be filled on 09/01/23.

## 2023-08-29 NOTE — Progress Notes (Signed)
Specialty Pharmacy Ongoing Clinical Assessment Note  Nathan Prince is a 57 y.o. male who is being followed by the specialty pharmacy service for RxSp Oncology   Patient's specialty medication(s) reviewed today: Ivosidenib (Tibsovo)   Missed doses in the last 4 weeks: 0   Patient/Caregiver did not have any additional questions or concerns.   Therapeutic benefit summary: Patient is achieving benefit   Adverse events/side effects summary: No adverse events/side effects   Patient's therapy is appropriate to: Continue    Goals Addressed             This Visit's Progress    Slow Disease Progression       Patient is on track. Patient will maintain adherence. Next MRI after cycle 10 per provider November note.          Follow up:  3 months  Otto Herb Specialty Pharmacist

## 2023-09-01 ENCOUNTER — Other Ambulatory Visit: Payer: Self-pay

## 2023-09-01 ENCOUNTER — Inpatient Hospital Stay: Payer: BC Managed Care – PPO | Attending: Internal Medicine

## 2023-09-01 ENCOUNTER — Inpatient Hospital Stay: Payer: BC Managed Care – PPO

## 2023-09-01 ENCOUNTER — Inpatient Hospital Stay (HOSPITAL_BASED_OUTPATIENT_CLINIC_OR_DEPARTMENT_OTHER): Payer: BC Managed Care – PPO | Admitting: Internal Medicine

## 2023-09-01 VITALS — BP 142/92 | HR 73 | Temp 97.3°F | Resp 15 | Wt 229.5 lb

## 2023-09-01 DIAGNOSIS — C719 Malignant neoplasm of brain, unspecified: Secondary | ICD-10-CM | POA: Insufficient documentation

## 2023-09-01 DIAGNOSIS — R569 Unspecified convulsions: Secondary | ICD-10-CM

## 2023-09-01 DIAGNOSIS — Z79899 Other long term (current) drug therapy: Secondary | ICD-10-CM | POA: Diagnosis not present

## 2023-09-01 DIAGNOSIS — Z79630 Long term (current) use of alkylating agent: Secondary | ICD-10-CM | POA: Insufficient documentation

## 2023-09-01 DIAGNOSIS — Z923 Personal history of irradiation: Secondary | ICD-10-CM | POA: Insufficient documentation

## 2023-09-01 LAB — CBC WITH DIFFERENTIAL (CANCER CENTER ONLY)
Abs Immature Granulocytes: 0.03 10*3/uL (ref 0.00–0.07)
Basophils Absolute: 0.1 10*3/uL (ref 0.0–0.1)
Basophils Relative: 1 %
Eosinophils Absolute: 0.1 10*3/uL (ref 0.0–0.5)
Eosinophils Relative: 1 %
HCT: 48.1 % (ref 39.0–52.0)
Hemoglobin: 16.7 g/dL (ref 13.0–17.0)
Immature Granulocytes: 0 %
Lymphocytes Relative: 28 %
Lymphs Abs: 2 10*3/uL (ref 0.7–4.0)
MCH: 30.1 pg (ref 26.0–34.0)
MCHC: 34.7 g/dL (ref 30.0–36.0)
MCV: 86.8 fL (ref 80.0–100.0)
Monocytes Absolute: 0.5 10*3/uL (ref 0.1–1.0)
Monocytes Relative: 7 %
Neutro Abs: 4.6 10*3/uL (ref 1.7–7.7)
Neutrophils Relative %: 63 %
Platelet Count: 247 10*3/uL (ref 150–400)
RBC: 5.54 MIL/uL (ref 4.22–5.81)
RDW: 12.4 % (ref 11.5–15.5)
WBC Count: 7.3 10*3/uL (ref 4.0–10.5)
nRBC: 0 % (ref 0.0–0.2)

## 2023-09-01 LAB — CMP (CANCER CENTER ONLY)
ALT: 15 U/L (ref 0–44)
AST: 16 U/L (ref 15–41)
Albumin: 4.5 g/dL (ref 3.5–5.0)
Alkaline Phosphatase: 76 U/L (ref 38–126)
Anion gap: 6 (ref 5–15)
BUN: 15 mg/dL (ref 6–20)
CO2: 30 mmol/L (ref 22–32)
Calcium: 10.1 mg/dL (ref 8.9–10.3)
Chloride: 105 mmol/L (ref 98–111)
Creatinine: 0.97 mg/dL (ref 0.61–1.24)
GFR, Estimated: >60 mL/min (ref >60)
Glucose, Bld: 85 mg/dL (ref 70–99)
Potassium: 4.1 mmol/L (ref 3.5–5.1)
Sodium: 141 mmol/L (ref 135–145)
Total Bilirubin: 0.4 mg/dL (ref <1.2)
Total Protein: 6.9 g/dL (ref 6.5–8.1)

## 2023-09-01 MED ORDER — SERTRALINE HCL 25 MG PO TABS: 25.0000 mg | ORAL_TABLET | Freq: Every day | ORAL | 2 refills | Status: DC

## 2023-09-01 MED ORDER — TIBSOVO 250 MG PO TABS
500.0000 mg | ORAL_TABLET | Freq: Every day | ORAL | 1 refills | Status: DC
  Filled 2023-09-01: qty 60, 30d supply, fill #0
  Filled 2023-09-19: qty 60, 30d supply, fill #1

## 2023-09-01 NOTE — Progress Notes (Signed)
[facility] [address]  [place], [place] [address] [phone]   Interval Evaluation  Date of Service: 09/01/23 Patient Name: Nathan Prince Patient MRN: [id number] Patient DOB: Jan 10, 1966 Provider: Henreitta Leber, MD  Identifying Statement:  Nathan Prince is a 57 y.o. 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 Pathology: astrocytoma (WHO Grade II) per patient report.  10/02/2011 Surgery Craniotomy for gross total resection performed by Lavonna Monarch, MD. Pathology: anaplastic oligoastrocytoma (WHO Grade III).  - 12/31/2011 Radiation Radiographically stable after conclusion of radiation therapy with concurrent temozolomide 75 mg/m2 daily. Recommended proceeding with five day temozolomide, dosed at 150 mg/m2 for the first cycle and escalated to 200 mg/m2 for subsequent cycles if well tolerated.  01/01/2012 - 10/27/2012 Chemotherapy Eleven cycles of five day temozolomide. Hypometabolic PET scan. Recommended discontinuing temozolomide. Serial MRI monitoring initiated.  02/27/2021 Clinical Event-Other MRI on 02/27/21 reveals new very small area of enhancement medial to resection cavity best seen on axials, concerning for possible disease progression; note made of new left frontal enhancing lesion, which was not present on the 04/11/20 scan.  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 Lavonna Monarch for resection of tumor.  03/27/2021 Radiology Study MRI brain 4 week follow up scan shows growth of the left frontal lesion. Increased left frontal convexity enhancing dural based lesion measuring 1.9 cm AP by 1.0 cm TV by 2.0 cm CC (1.4 x 0.7 x 1.8 cm on 02/27/2021 exam). Recommend resection with Dr. Zachery Conch.  07/31/2021 Radiology Study MRI post radiation for left frontal meningioma and for left temporal oligoastrocytoma surveillance. These two sites are stable. Small enhancing focus medial to left temporal resection cavity is apparent on today's scan comparable to July 2022. Will follow up with a short term MRI in another 6 weeks.  Atypical meningioma of brain (CMS-HCC) 04/20/2021 Initial Diagnosis Atypical meningioma of brain (CMS-HCC)  04/20/2021 Surgery Resection of left frontal convexity mass with Lavonna Monarch. Pathology: atypical meningioma, WHO grade II. TERT negative.  05/31/2021 - 07/16/2021 Radiation IMRT to the left frontal convexity resected typical meningioma total dose 57.6 Gy  07/31/2021 Radiology Study MRI post radiation for left frontal meningioma and for left temporal oligoastrocytoma surveillance. These two sites are stable. Small enhancing focus medial to left temporal resection cavity is apparent on today's scan comparable to July 2022. Will follow up with a short term MRI in another 6 weeks.  11/2022 - Chemotherapy  Tibsovo   Biomarkers:  MGMT Unknown.  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  EGFR Unknown  TERT Unknown   Interval History: Nathan Prince presents today for follow up, now having completed cycle #9 of oral Tibsovo.  No new or progressive changes from neurologic standpoint.  Auras have been less frequent since increasing the Onfi to 10mg  daily.  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  Depression and  mood swings continue  to be a problem at times.  H+P (11/04/22) Patient presents today to establish care for local treatment of progressive grade 3 astrocytoma, IDHmt.  He and his wife describe decline in function, energy, cognition, language expression since his hospitalization in January for status epilepticus.  He is still teaching full time, and is having difficulty in class with communication and overall energy.  Currently dosing Keppra 2000mg  BID, Vimpat 200mg  BID, and Depakote 1000mg  BID.  Not on any steroids.  He will be proceeding with Tibsovo therapy per [facility] team recommendations.  Medications: Current Outpatient Medications on File Prior to Visit  Medication Sig Dispense Refill   Apoaequorin (PREVAGEN PO) Take 1 tablet by mouth daily.     Calcium Carb-Cholecalciferol (CALCIUM 600/VITAMIN D3 PO) Take 1 tablet by mouth 2 (two) times daily.     cloBAZam (ONFI) 10 MG tablet Take 1 tablet (10 mg total) by mouth at bedtime. 30 tablet 1   clonazePAM (KLONOPIN) 1 MG disintegrating tablet Take 1 mg by mouth daily as needed.     diazePAM, 20 MG Dose, (VALTOCO 20 MG DOSE) 2 x 10 MG/0.1ML LQPK Place 1 spray into both nostrils daily as needed for up to 5 doses (for seizure lasting over 5 minutes). (Patient not taking: Reported on 11/04/2022) 2 each 3   ivosidenib (TIBSOVO) 250 MG tablet Take 2 tablets (500 mg total) by mouth daily. Avoid taking with a high-fat meal. 60 tablet 1   lacosamide (VIMPAT) 200 MG TABS tablet TAKE 1 TABLET BY MOUTH TWICE A DAY 60 tablet 2   levETIRAcetam 1000 MG TB24 Take 2,000 mg by mouth 2 (two) times daily. 112 tablet 2   loratadine (CLARITIN) 10 MG tablet Take 10 mg by mouth daily as needed for allergies.     Multiple Vitamins-Minerals (MULTIVITAMINS THER. W/MINERALS) TABS Take 1 tablet by mouth daily.      prochlorperazine (COMPAZINE) 10 MG tablet Take 1 tablet (10 mg total) by mouth every 6 (six) hours as needed for nausea or vomiting. (Patient not taking: Reported on 04/07/2023) 30 tablet 2    pyridoxine (B-6) 100 MG tablet Take by mouth.     Current Facility-Administered Medications on File Prior to Visit  Medication Dose Route Frequency Provider Last Rate Last Admin   topical emolient (BIAFINE) emulsion   Topical PRN Margaretmary Dys, MD   Given at 11/15/11 1401    Allergies: No Known Allergies Past Medical History:  Past Medical History:  Diagnosis Date   Astrocytoma (HCC) 09/17/2011   subsequent oligodendroglioma in 2022   Depression    Low testosterone 03/30/2013   Radiation 11/04/11-12/19/11   Left temporal brain 59.4 gray in 33 fractions   Skin cancer 09/09/1986   Past Surgical History:  Past Surgical History:  Procedure Laterality Date   BACK SURGERY  09/09/1994   ruptured disc   CRANIOTOMY  09/04/2011   Procedure: CRANIOTOMY TUMOR EXCISION;  Surgeon: Karn Cassis;  Location: [facility];  Service: Neurosurgery;  Laterality: Left;  Left Temporal craniectomy for tumor   MASS BIOPSY  09/05/2011   left temporal lobe   skin cancer removal  1988   Removed skin cancer to left chest   Social History:  Social History   Socioeconomic History   Marital status: Married    Spouse name: Not on file   Number of children: Not on file   Years of education: Not on file   Highest education level: Not on file  Occupational History  Occupation: history Runner, broadcasting/film/video at [employer]  Tobacco Use   Smoking status: Never   Smokeless tobacco: Never   Tobacco comments:    never used tobacco  Vaping Use   Vaping status: Never Used  Substance and Sexual Activity   Alcohol use: No    Alcohol/week: 0.0 standard drinks of alcohol   Drug use: No   Sexual activity: Not on file  Other Topics Concern   Not on file  Social History Narrative   Not on file   Social Drivers of Health   Financial Resource Strain: Not on file  Food Insecurity: No Food Insecurity (09/23/2022)   Hunger Vital Sign    Worried About Running Out of Food in the Last Year: Never true    Ran Out of  Food in the Last Year: Never true  Transportation Needs: No Transportation Needs (09/23/2022)   PRAPARE - Administrator, Civil Service (Medical): No    Lack of Transportation (Non-Medical): No  Physical Activity: Not on file  Stress: Not on file  Social Connections: Not on file  Intimate Partner Violence: Not At Risk (09/24/2022)   Humiliation, Afraid, Rape, and Kick questionnaire    Fear of Current or Ex-Partner: No    Emotionally Abused: No    Physically Abused: No    Sexually Abused: No   Family History: No family history on file.  Review of Systems: Constitutional: Doesn't report fevers, chills or abnormal weight loss Eyes: Doesn't report blurriness of vision Ears, nose, mouth, throat, and face: Doesn't report sore throat Respiratory: Doesn't report cough, dyspnea or wheezes Cardiovascular: Doesn't report palpitation, chest discomfort  Gastrointestinal:  Doesn't report nausea, constipation, diarrhea GU: Doesn't report incontinence Skin: Doesn't report skin rashes Neurological: Per HPI Musculoskeletal: Doesn't report joint pain Behavioral/Psych: Doesn't report anxiety  Physical Exam: Vitals:   09/01/23 0927  BP: (!) 142/92  Pulse: 73  Resp: 15  Temp: (!) 97.3 F (36.3 C)  SpO2: 100%    KPS: 80. General: Alert, cooperative, pleasant, in no acute distress Head: Normal EENT: No conjunctival injection or scleral icterus.  Lungs: Resp effort normal Cardiac: Regular rate Abdomen: Non-distended abdomen Skin: No rashes cyanosis or petechiae. Extremities: No clubbing or edema  Neurologic Exam: Mental Status: Awake, alert, attentive to examiner. Oriented to self and environment. Language is modestly impaired with regards to fluency.  Age advanced pyschomotor slowing Cranial Nerves: Visual acuity is grossly normal. Visual fields are full. Extra-ocular movements intact. No ptosis. Face is symmetric Motor: Tone and bulk are normal. Power is full in both arms and  legs. Reflexes are symmetric, no pathologic reflexes present.  Sensory: Intact to light touch Gait: Normal.   Labs: I have reviewed the data as listed    Component Value Date/Time   NA 139 06/24/2023 0834   NA 141 10/28/2022 0946   NA 140 03/28/2015 0957   K 4.0 06/24/2023 0834   K 3.9 03/28/2015 0957   CL 106 06/24/2023 0834   CL 104 03/28/2015 0957   CO2 25 06/24/2023 0834   CO2 26 03/28/2015 0957   GLUCOSE 122 (H) 06/24/2023 0834   GLUCOSE 124 (H) 03/28/2015 0957   BUN 17 06/24/2023 0834   BUN 16 10/28/2022 0946   BUN 14 03/28/2015 0957   CREATININE 0.96 06/24/2023 0834   CREATININE 1.1 03/28/2015 0957   CALCIUM 9.6 06/24/2023 0834   CALCIUM 9.0 03/28/2015 0957   PROT 6.8 06/24/2023 0834   PROT 6.9 10/28/2022 0946  PROT 7.2 03/28/2015 0957   ALBUMIN 4.3 06/24/2023 0834   ALBUMIN 4.5 10/28/2022 0946   AST 17 06/24/2023 0834   ALT 16 06/24/2023 0834   ALT 27 03/28/2015 0957   ALKPHOS 67 06/24/2023 0834   ALKPHOS 81 03/28/2015 0957   BILITOT 0.5 06/24/2023 0834   GFRNONAA >60 06/24/2023 0834   GFRAA >90 09/05/2011 0402   Lab Results  Component Value Date   WBC 6.8 06/24/2023   NEUTROABS 4.6 06/24/2023   HGB 15.9 06/24/2023   HCT 46.3 06/24/2023   MCV 87.5 06/24/2023   PLT 253 06/24/2023     Assessment/Plan Astrocytoma (HCC)  Seizure (HCC)  Nathan Prince is clinically stable today, now having completed 9 cycles of oral Tibsovo for IDH mutant astrocytoma.    EKG was performed and reviewed today, QTc was .  Will con't Keppra (2000mg  BID ER), Vimpat (200mg  BID) and Onfi (10mg  daily) at this time.     For astrocytoma, he will continue with cycle #10 Tibsovo 500mg  daily.  Next MRI will be performed following cycle #10.  Therapy should be held for the following:  QTC greater than  Platelets less than 100,000  LFT or creatinine greater than 2x ULN  If clinical concerns/contraindications develop  We were agreeable with trial of zoloft  25mg  daily for depression symptoms.   We ask that Nathan Prince return to clinic in 1 months following cycle #10 Tibsovo with MRI brain for review, or sooner as needed.    All questions were answered. The patient knows to call the clinic with any problems, questions or concerns. No barriers to learning were detected.  The total time spent in the encounter was 40 minutes and more than 50% was on counseling and review of test results   Henreitta Leber, MD Medical Director of Neuro-Oncology [facility] at [place] 09/01/23 9:08 AM

## 2023-09-01 NOTE — Progress Notes (Addendum)
09/01/23 - Tibsovo  Nathan Prince is ordering medication for 12/24.Marland Kitchen left patient a voicemail that new delivery date will be 12/26 and to call back if needed sooner.

## 2023-09-02 ENCOUNTER — Other Ambulatory Visit (HOSPITAL_COMMUNITY): Payer: Self-pay

## 2023-09-02 ENCOUNTER — Other Ambulatory Visit: Payer: Self-pay

## 2023-09-19 ENCOUNTER — Other Ambulatory Visit: Payer: Self-pay

## 2023-09-19 NOTE — Progress Notes (Signed)
 Specialty Pharmacy Refill Coordination Note  Nathan Prince is a 58 y.o. male contacted today regarding refills of specialty medication(s) Ivosidenib  (Tibsovo )   Patient requested Delivery   Delivery date: 09/30/23   Verified address: [address]   [place] [address]   Medication will be filled on 09/29/23.

## 2023-09-19 NOTE — Progress Notes (Signed)
 Clinical Intervention Note  Clinical Intervention Notes: Patient reported starting Zoloft . Per Up to Date, Risk Rating C: Monitor therapy with Tibsovo . Dr. Buckley prescribed both prescriptions and therefore Tibssovo will be monitored. Closing Intervention.   Clinical Intervention Outcomes: Prevention of an adverse drug event   Mitzie GORMAN [facility] Specialty Pharmacist

## 2023-09-21 ENCOUNTER — Other Ambulatory Visit: Payer: Self-pay | Admitting: Internal Medicine

## 2023-09-23 ENCOUNTER — Other Ambulatory Visit: Payer: Self-pay | Admitting: Internal Medicine

## 2023-09-24 ENCOUNTER — Other Ambulatory Visit: Payer: Self-pay

## 2023-09-24 ENCOUNTER — Telehealth: Payer: Self-pay | Admitting: Pharmacy Technician

## 2023-09-24 ENCOUNTER — Other Ambulatory Visit (HOSPITAL_COMMUNITY): Payer: Self-pay

## 2023-09-24 NOTE — Telephone Encounter (Signed)
 Oral Oncology Patient Advocate Encounter   Was successful in obtaining a copay card for Tibsovo .  This copay card will make the patients copay $25.   The billing information is as follows and has been shared with [facility].   RxBin: [id number] PCN: [id number] Member ID: [id number] Group ID: [id number]   Paulette Borrow, CPhT-Adv Oncology Pharmacy Patient Advocate [facility] Direct Number: [phone]  Fax: [phone]

## 2023-09-25 ENCOUNTER — Other Ambulatory Visit (HOSPITAL_COMMUNITY): Payer: Self-pay

## 2023-09-26 ENCOUNTER — Other Ambulatory Visit (HOSPITAL_COMMUNITY): Payer: Self-pay

## 2023-10-06 ENCOUNTER — Telehealth: Payer: Self-pay | Admitting: Internal Medicine

## 2023-10-06 ENCOUNTER — Inpatient Hospital Stay (HOSPITAL_BASED_OUTPATIENT_CLINIC_OR_DEPARTMENT_OTHER): Payer: 59 | Admitting: Internal Medicine

## 2023-10-06 ENCOUNTER — Inpatient Hospital Stay: Payer: 59 | Attending: Internal Medicine

## 2023-10-06 VITALS — BP 137/99 | HR 77 | Temp 97.9°F | Resp 18 | Ht 70.0 in | Wt 232.0 lb

## 2023-10-06 DIAGNOSIS — C719 Malignant neoplasm of brain, unspecified: Secondary | ICD-10-CM

## 2023-10-06 DIAGNOSIS — R569 Unspecified convulsions: Secondary | ICD-10-CM

## 2023-10-06 DIAGNOSIS — Z923 Personal history of irradiation: Secondary | ICD-10-CM | POA: Diagnosis not present

## 2023-10-06 DIAGNOSIS — Z85828 Personal history of other malignant neoplasm of skin: Secondary | ICD-10-CM | POA: Insufficient documentation

## 2023-10-06 DIAGNOSIS — Z9221 Personal history of antineoplastic chemotherapy: Secondary | ICD-10-CM | POA: Insufficient documentation

## 2023-10-06 DIAGNOSIS — C712 Malignant neoplasm of temporal lobe: Secondary | ICD-10-CM | POA: Insufficient documentation

## 2023-10-06 LAB — CMP (CANCER CENTER ONLY)
ALT: 16 U/L (ref 0–44)
AST: 19 U/L (ref 15–41)
Albumin: 4.4 g/dL (ref 3.5–5.0)
Alkaline Phosphatase: 67 U/L (ref 38–126)
Anion gap: 5 (ref 5–15)
BUN: 15 mg/dL (ref 6–20)
CO2: 29 mmol/L (ref 22–32)
Calcium: 9.7 mg/dL (ref 8.9–10.3)
Chloride: 107 mmol/L (ref 98–111)
Creatinine: 1.06 mg/dL (ref 0.61–1.24)
GFR, Estimated: >60 mL/min (ref >60)
Glucose, Bld: 80 mg/dL (ref 70–99)
Potassium: 4.5 mmol/L (ref 3.5–5.1)
Sodium: 141 mmol/L (ref 135–145)
Total Bilirubin: 0.3 mg/dL (ref 0.0–1.2)
Total Protein: 6.9 g/dL (ref 6.5–8.1)

## 2023-10-06 LAB — CBC WITH DIFFERENTIAL (CANCER CENTER ONLY)
Abs Immature Granulocytes: 0.02 10*3/uL (ref 0.00–0.07)
Basophils Absolute: 0.1 10*3/uL (ref 0.0–0.1)
Basophils Relative: 1 %
Eosinophils Absolute: 0 10*3/uL (ref 0.0–0.5)
Eosinophils Relative: 1 %
HCT: 45.8 % (ref 39.0–52.0)
Hemoglobin: 15.6 g/dL (ref 13.0–17.0)
Immature Granulocytes: 0 %
Lymphocytes Relative: 24 %
Lymphs Abs: 1.8 10*3/uL (ref 0.7–4.0)
MCH: 29.2 pg (ref 26.0–34.0)
MCHC: 34.1 g/dL (ref 30.0–36.0)
MCV: 85.6 fL (ref 80.0–100.0)
Monocytes Absolute: 0.6 10*3/uL (ref 0.1–1.0)
Monocytes Relative: 8 %
Neutro Abs: 5 10*3/uL (ref 1.7–7.7)
Neutrophils Relative %: 66 %
Platelet Count: 233 10*3/uL (ref 150–400)
RBC: 5.35 MIL/uL (ref 4.22–5.81)
RDW: 12.4 % (ref 11.5–15.5)
WBC Count: 7.5 10*3/uL (ref 4.0–10.5)
nRBC: 0 % (ref 0.0–0.2)

## 2023-10-06 NOTE — Telephone Encounter (Signed)
Nathan Prince

## 2023-10-06 NOTE — Progress Notes (Signed)
[facility] at [facility] [address]  [place], [place] [address] [phone]   Interval Evaluation  Date of Service: 10/06/23 Patient Name: Nathan Prince Patient MRN: [id number] Patient DOB: Mar 04, 1966 Provider: Henreitta Leber, MD  Identifying Statement:  Nathan Prince is a 58 y.o. 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 Pathology: astrocytoma (WHO Grade II) per patient report.  10/02/2011 Surgery Craniotomy for gross total resection performed by Lavonna Monarch, MD. Pathology: anaplastic oligoastrocytoma (WHO Grade III).  - 12/31/2011 Radiation Radiographically stable after conclusion of radiation therapy with concurrent temozolomide 75 mg/m2 daily. Recommended proceeding with five day temozolomide, dosed at 150 mg/m2 for the first cycle and escalated to 200 mg/m2 for subsequent cycles if well tolerated.  01/01/2012 - 10/27/2012 Chemotherapy Eleven cycles of five day temozolomide. Hypometabolic PET scan. Recommended discontinuing temozolomide. Serial MRI monitoring initiated.  02/27/2021 Clinical Event-Other MRI on 02/27/21 reveals new very small area of enhancement medial to resection cavity best seen on axials, concerning for possible disease progression; note made of new left frontal enhancing lesion, which was not present on the 04/11/20 scan.  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 Lavonna Monarch for resection of tumor.  03/27/2021 Radiology Study MRI brain 4 week follow up scan shows growth of the left frontal lesion. Increased left frontal convexity enhancing dural based lesion measuring 1.9 cm AP by 1.0 cm TV by 2.0 cm CC (1.4 x 0.7 x 1.8 cm on 02/27/2021 exam). Recommend resection with Dr. Zachery Conch.  07/31/2021 Radiology Study MRI post radiation for left frontal meningioma and for left temporal oligoastrocytoma surveillance. These two sites are stable. Small enhancing focus medial to left temporal resection cavity is apparent on today's scan comparable to July 2022. Will follow up with a short term MRI in another 6 weeks.  Atypical meningioma of brain (CMS-HCC) 04/20/2021 Initial Diagnosis Atypical meningioma of brain (CMS-HCC)  04/20/2021 Surgery Resection of left frontal convexity mass with Lavonna Monarch. Pathology: atypical meningioma, WHO grade II. TERT negative.  05/31/2021 - 07/16/2021 Radiation IMRT to the left frontal convexity resected typical meningioma total dose 57.6 Gy  07/31/2021 Radiology Study MRI post radiation for left frontal meningioma and for left temporal oligoastrocytoma surveillance. These two sites are stable. Small enhancing focus medial to left temporal resection cavity is apparent on today's scan comparable to July 2022. Will follow up with a short term MRI in another 6 weeks.  11/2022 - Chemotherapy  Tibsovo   Biomarkers:  MGMT Unknown.  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  EGFR Unknown  TERT Unknown   Interval History: Nathan Prince presents today for follow up, now having completed cycle #10 of oral Tibsovo, recent MRI brain.  No new or progressive changes from neurologic standpoint.  Very few auras, remaining on Onfi to 10mg  daily.  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  Depression and  mood swings continue to  be a problem at times.  H+P (11/04/22) Patient presents today to establish care for local treatment of progressive grade 3 astrocytoma, IDHmt.  He and his wife describe decline in function, energy, cognition, language expression since his hospitalization in January for status epilepticus.  He is still teaching full time, and is having difficulty in class with communication and overall energy.  Currently dosing Keppra 2000mg  BID, Vimpat 200mg  BID, and Depakote 1000mg  BID.  Not on any steroids.  He will be proceeding with Tibsovo therapy per [facility] team recommendations.  Medications: Current Outpatient Medications on File Prior to Visit  Medication Sig Dispense Refill   Apoaequorin (PREVAGEN PO) Take 1 tablet by mouth daily.     Calcium Carb-Cholecalciferol (CALCIUM 600/VITAMIN D3 PO) Take 1 tablet by mouth 2 (two) times daily.     cloBAZam (ONFI) 10 MG tablet TAKE 1 TABLET BY MOUTH EVERYDAY AT BEDTIME 30 tablet 1   clonazePAM (KLONOPIN) 1 MG disintegrating tablet Take 1 mg by mouth daily as needed.     diazePAM, 20 MG Dose, (VALTOCO 20 MG DOSE) 2 x 10 MG/0.1ML LQPK Place 1 spray into both nostrils daily as needed for up to 5 doses (for seizure lasting over 5 minutes). (Patient not taking: Reported on 09/01/2023) 2 each 3   ivosidenib (TIBSOVO) 250 MG tablet Take 2 tablets (500 mg total) by mouth daily. Avoid taking with a high-fat meal. 60 tablet 1   lacosamide (VIMPAT) 200 MG TABS tablet TAKE 1 TABLET BY MOUTH TWICE A DAY 60 tablet 2   levETIRAcetam 1000 MG TB24 Take 2,000 mg by mouth 2 (two) times daily. 112 tablet 2   loratadine (CLARITIN) 10 MG tablet Take 10 mg by mouth daily as needed for allergies.     Multiple Vitamins-Minerals (MULTIVITAMINS THER. W/MINERALS) TABS Take 1 tablet by mouth daily.      prochlorperazine (COMPAZINE) 10 MG tablet Take 1 tablet (10 mg total) by mouth every 6 (six) hours as needed for nausea or vomiting. 30 tablet 2   pyridoxine (B-6) 100 MG tablet Take by mouth.      sertraline (ZOLOFT) 25 MG tablet TAKE 1 TABLET (25 MG TOTAL) BY MOUTH DAILY. 90 tablet 1   Current Facility-Administered Medications on File Prior to Visit  Medication Dose Route Frequency Provider Last Rate Last Admin   topical emolient (BIAFINE) emulsion   Topical PRN Margaretmary Dys, MD   Given at 11/15/11 1401    Allergies: No Known Allergies Past Medical History:  Past Medical History:  Diagnosis Date   Astrocytoma (HCC) 09/17/2011   subsequent oligodendroglioma in 2022   Depression    Low testosterone 03/30/2013   Radiation 11/04/11-12/19/11   Left temporal brain 59.4 gray in 33 fractions   Skin cancer 09/09/1986   Past Surgical History:  Past Surgical History:  Procedure Laterality Date   BACK SURGERY  09/09/1994   ruptured disc   CRANIOTOMY  09/04/2011   Procedure: CRANIOTOMY TUMOR EXCISION;  Surgeon: Karn Cassis;  Location: [facility];  Service: Neurosurgery;  Laterality: Left;  Left Temporal craniectomy for tumor   MASS BIOPSY  09/05/2011   left temporal lobe   skin cancer removal  1988   Removed skin cancer to left chest   Social History:  Social History   Socioeconomic History   Marital status: Married    Spouse name: Not on file   Number of children: Not on file   Years of education: Not on file  Highest education level: Not on file  Occupational History   Occupation: history teacher at [employer]  Tobacco Use   Smoking status: Never   Smokeless tobacco: Never   Tobacco comments:    never used tobacco  Vaping Use   Vaping status: Never Used  Substance and Sexual Activity   Alcohol use: No    Alcohol/week: 0.0 standard drinks of alcohol   Drug use: No   Sexual activity: Not on file  Other Topics Concern   Not on file  Social History Narrative   Not on file   Social Drivers of Health   Financial Resource Strain: Not on file  Food Insecurity: No Food Insecurity (09/23/2022)   Hunger Vital Sign    Worried About Running Out of Food in  the Last Year: Never true    Ran Out of Food in the Last Year: Never true  Transportation Needs: No Transportation Needs (09/23/2022)   PRAPARE - Administrator, Civil Service (Medical): No    Lack of Transportation (Non-Medical): No  Physical Activity: Not on file  Stress: Not on file  Social Connections: Not on file  Intimate Partner Violence: Not At Risk (09/24/2022)   Humiliation, Afraid, Rape, and Kick questionnaire    Fear of Current or Ex-Partner: No    Emotionally Abused: No    Physically Abused: No    Sexually Abused: No   Family History: No family history on file.  Review of Systems: Constitutional: Doesn't report fevers, chills or abnormal weight loss Eyes: Doesn't report blurriness of vision Ears, nose, mouth, throat, and face: Doesn't report sore throat Respiratory: Doesn't report cough, dyspnea or wheezes Cardiovascular: Doesn't report palpitation, chest discomfort  Gastrointestinal:  Doesn't report nausea, constipation, diarrhea GU: Doesn't report incontinence Skin: Doesn't report skin rashes Neurological: Per HPI Musculoskeletal: Doesn't report joint pain Behavioral/Psych: Doesn't report anxiety  Physical Exam: Vitals:   10/06/23 1404  BP: (!) 137/99  Pulse: 77  Resp: 18  Temp: 97.9 F (36.6 C)  SpO2: 99%    KPS: 80. General: Alert, cooperative, pleasant, in no acute distress Head: Normal EENT: No conjunctival injection or scleral icterus.  Lungs: Resp effort normal Cardiac: Regular rate Abdomen: Non-distended abdomen Skin: No rashes cyanosis or petechiae. Extremities: No clubbing or edema  Neurologic Exam: Mental Status: Awake, alert, attentive to examiner. Oriented to self and environment. Language is modestly impaired with regards to fluency.  Age advanced pyschomotor slowing Cranial Nerves: Visual acuity is grossly normal. Visual fields are full. Extra-ocular movements intact. No ptosis. Face is symmetric Motor: Tone and bulk are  normal. Power is full in both arms and legs. Reflexes are symmetric, no pathologic reflexes present.  Sensory: Intact to light touch Gait: Normal.   Labs: I have reviewed the data as listed    Component Value Date/Time   NA 141 09/01/2023 0910   NA 141 10/28/2022 0946   NA 140 03/28/2015 0957   K 4.1 09/01/2023 0910   K 3.9 03/28/2015 0957   CL 105 09/01/2023 0910   CL 104 03/28/2015 0957   CO2 30 09/01/2023 0910   CO2 26 03/28/2015 0957   GLUCOSE 85 09/01/2023 0910   GLUCOSE 124 (H) 03/28/2015 0957   BUN 15 09/01/2023 0910   BUN 16 10/28/2022 0946   BUN 14 03/28/2015 0957   CREATININE 0.97 09/01/2023 0910   CREATININE 1.1 03/28/2015 0957   CALCIUM 10.1 09/01/2023 0910   CALCIUM 9.0 03/28/2015 0957   PROT 6.9 09/01/2023  0910   PROT 6.9 10/28/2022 0946   PROT 7.2 03/28/2015 0957   ALBUMIN 4.5 09/01/2023 0910   ALBUMIN 4.5 10/28/2022 0946   AST 16 09/01/2023 0910   ALT 15 09/01/2023 0910   ALT 27 03/28/2015 0957   ALKPHOS 76 09/01/2023 0910   ALKPHOS 81 03/28/2015 0957   BILITOT 0.4 09/01/2023 0910   GFRNONAA >60 09/01/2023 0910   GFRAA >90 09/05/2011 0402   Lab Results  Component Value Date   WBC 7.5 10/06/2023   NEUTROABS 5.0 10/06/2023   HGB 15.6 10/06/2023   HCT 45.8 10/06/2023   MCV 85.6 10/06/2023   PLT 233 10/06/2023     Assessment/Plan Astrocytoma (HCC)  Seizure (HCC)  Kamrin Sibley is clinically stable today, now having completed 10 cycles of oral Tibsovo for IDH mutant astrocytoma.  MRI brain demonstrates stable findings.  Will con't Keppra (2000mg  BID ER), Vimpat (200mg  BID) and Onfi (10mg  daily) at this time.  We did discuss options for mood issues, including transition to briviact, or careful reduction of Keppra.   For astrocytoma, he will continue with cycle #11 Tibsovo 500mg  daily.  Next MRI will be performed following cycle #13.  Therapy should be held for the following:  QTC greater than  Platelets less than 100,000  LFT or  creatinine greater than 2x ULN  If clinical concerns/contraindications develop  May con't zoloft 25mg  daily for depression symptoms.   We ask that Lauri Till return to clinic in 1-2 months following cycle #11-12 Tibsovo with MRI brain for review, or sooner as needed.    All questions were answered. The patient knows to call the clinic with any problems, questions or concerns. No barriers to learning were detected.  The total time spent in the encounter was 40 minutes and more than 50% was on counseling and review of test results   Henreitta Leber, MD Medical Director of Neuro-Oncology [facility] at [facility] 10/06/23 2:06 PM

## 2023-10-10 ENCOUNTER — Other Ambulatory Visit: Payer: Self-pay

## 2023-10-10 ENCOUNTER — Other Ambulatory Visit: Payer: Self-pay | Admitting: Internal Medicine

## 2023-10-10 MED ORDER — CLOBAZAM 10 MG PO TABS: 10.0000 mg | ORAL_TABLET | Freq: Every evening | ORAL | 1 refills | Status: AC

## 2023-10-16 ENCOUNTER — Other Ambulatory Visit: Payer: Self-pay | Admitting: Internal Medicine

## 2023-10-16 ENCOUNTER — Other Ambulatory Visit (HOSPITAL_COMMUNITY): Payer: Self-pay

## 2023-10-16 ENCOUNTER — Other Ambulatory Visit: Payer: Self-pay

## 2023-10-16 DIAGNOSIS — C719 Malignant neoplasm of brain, unspecified: Secondary | ICD-10-CM

## 2023-10-16 MED ORDER — TIBSOVO 250 MG PO TABS
500.0000 mg | ORAL_TABLET | Freq: Every day | ORAL | 1 refills | Status: DC
  Filled 2023-10-16: qty 60, 30d supply, fill #0
  Filled 2023-11-24: qty 60, 30d supply, fill #1

## 2023-10-16 NOTE — Progress Notes (Signed)
 Specialty Pharmacy Ongoing Clinical Assessment Note  Nathan Prince is a 58 y.o. male who is being followed by the specialty pharmacy service for RxSp Oncology   Patient's specialty medication(s) reviewed today: Ivosidenib  (Tibsovo )   Missed doses in the last 4 weeks: 0   Patient/Caregiver did not have any additional questions or concerns.   No data recorded  Adverse events/side effects summary: Experienced adverse events/side effects (fatigue, tolerable at this time)   Patient's therapy is appropriate to: Continue    Goals Addressed             This Visit's Progress    Slow Disease Progression   On track    Patient is on track. Patient will maintain adherence. Recent MRI demonstrated stable findings per Dr. Eward office visit notes on 10/06/23.        Follow up:  3 months  Silvano LOISE Dolly Specialty Pharmacist

## 2023-10-16 NOTE — Progress Notes (Signed)
 Specialty Pharmacy Refill Coordination Note  Nathan Prince is a 58 y.o. male contacted today regarding refills of specialty medication(s) Ivosidenib  (Tibsovo )   Patient requested Delivery   Delivery date: 10/22/23   Verified address: [address] [place] [address]   Medication will be filled on 10/21/23.    *This fill date is pending response to refill request from provider. Patient is aware and if they have not received fill by intended date they must follow up with pharmacy.

## 2023-10-26 ENCOUNTER — Other Ambulatory Visit: Payer: Self-pay | Admitting: Internal Medicine

## 2023-11-01 ENCOUNTER — Other Ambulatory Visit: Payer: Self-pay | Admitting: Neurology

## 2023-11-10 ENCOUNTER — Other Ambulatory Visit: Payer: Self-pay

## 2023-11-20 ENCOUNTER — Other Ambulatory Visit (HOSPITAL_COMMUNITY): Payer: Self-pay

## 2023-11-24 ENCOUNTER — Other Ambulatory Visit: Payer: Self-pay

## 2023-11-24 NOTE — Progress Notes (Signed)
 Specialty Pharmacy Refill Coordination Note  Nathan Prince is a 59 y.o. male contacted today regarding refills of specialty medication(s) Ivosidenib (Tibsovo)   Patient requested Delivery   Delivery date: 11/27/23   Verified address: [address] [place] [address]   Medication will be filled on 03.19.25.

## 2023-11-26 ENCOUNTER — Telehealth: Payer: Self-pay | Admitting: Pharmacy Technician

## 2023-11-26 ENCOUNTER — Other Ambulatory Visit (HOSPITAL_COMMUNITY): Payer: Self-pay

## 2023-11-26 ENCOUNTER — Other Ambulatory Visit: Payer: Self-pay

## 2023-11-26 NOTE — Telephone Encounter (Signed)
 Oral Oncology Patient Advocate Encounter   Received notification that prior authorization for Tibsovo is required.   PA submitted on 11/26/23 Key [id number] Status is pending     Omer Jack, CPhT-Adv Oncology Pharmacy Patient Advocate [facility]  Direct Number: [phone]  Fax: [phone]

## 2023-11-26 NOTE — Telephone Encounter (Signed)
 Oral Oncology Patient Advocate Encounter  Prior Authorization for Tibsovo has been approved.    PA# [id number] Effective dates: 11/26/23 through 11/25/24  Patient may continue to fill at [facility].    Omer Jack, CPhT-Adv Oncology Pharmacy Patient Advocate [facility]  Direct Number: [phone]  Fax: [phone]

## 2023-12-08 ENCOUNTER — Inpatient Hospital Stay: Payer: 59

## 2023-12-08 ENCOUNTER — Inpatient Hospital Stay: Payer: 59 | Admitting: Internal Medicine

## 2023-12-09 ENCOUNTER — Inpatient Hospital Stay: Attending: Internal Medicine

## 2023-12-09 ENCOUNTER — Inpatient Hospital Stay: Attending: Internal Medicine | Admitting: Internal Medicine

## 2023-12-09 VITALS — BP 140/93 | HR 76 | Temp 98.1°F | Resp 16 | Wt 226.3 lb

## 2023-12-09 DIAGNOSIS — Z79899 Other long term (current) drug therapy: Secondary | ICD-10-CM | POA: Insufficient documentation

## 2023-12-09 DIAGNOSIS — C719 Malignant neoplasm of brain, unspecified: Secondary | ICD-10-CM | POA: Diagnosis not present

## 2023-12-09 DIAGNOSIS — R569 Unspecified convulsions: Secondary | ICD-10-CM | POA: Insufficient documentation

## 2023-12-09 DIAGNOSIS — Z923 Personal history of irradiation: Secondary | ICD-10-CM | POA: Insufficient documentation

## 2023-12-09 DIAGNOSIS — Z9221 Personal history of antineoplastic chemotherapy: Secondary | ICD-10-CM | POA: Diagnosis not present

## 2023-12-09 DIAGNOSIS — C712 Malignant neoplasm of temporal lobe: Secondary | ICD-10-CM | POA: Diagnosis present

## 2023-12-09 DIAGNOSIS — Z85828 Personal history of other malignant neoplasm of skin: Secondary | ICD-10-CM | POA: Insufficient documentation

## 2023-12-09 LAB — CBC WITH DIFFERENTIAL (CANCER CENTER ONLY)
Abs Immature Granulocytes: 0.02 10*3/uL (ref 0.00–0.07)
Basophils Absolute: 0.1 10*3/uL (ref 0.0–0.1)
Basophils Relative: 1 %
Eosinophils Absolute: 0.1 10*3/uL (ref 0.0–0.5)
Eosinophils Relative: 1 %
HCT: 48.5 % (ref 39.0–52.0)
Hemoglobin: 16.4 g/dL (ref 13.0–17.0)
Immature Granulocytes: 0 %
Lymphocytes Relative: 25 %
Lymphs Abs: 1.9 10*3/uL (ref 0.7–4.0)
MCH: 29.3 pg (ref 26.0–34.0)
MCHC: 33.8 g/dL (ref 30.0–36.0)
MCV: 86.6 fL (ref 80.0–100.0)
Monocytes Absolute: 0.7 10*3/uL (ref 0.1–1.0)
Monocytes Relative: 9 %
Neutro Abs: 4.8 10*3/uL (ref 1.7–7.7)
Neutrophils Relative %: 64 %
Platelet Count: 227 10*3/uL (ref 150–400)
RBC: 5.6 MIL/uL (ref 4.22–5.81)
RDW: 12.5 % (ref 11.5–15.5)
WBC Count: 7.5 10*3/uL (ref 4.0–10.5)
nRBC: 0 % (ref 0.0–0.2)

## 2023-12-09 LAB — CMP (CANCER CENTER ONLY)
ALT: 17 U/L (ref 0–44)
AST: 19 U/L (ref 15–41)
Albumin: 4.5 g/dL (ref 3.5–5.0)
Alkaline Phosphatase: 64 U/L (ref 38–126)
Anion gap: 6 (ref 5–15)
BUN: 14 mg/dL (ref 6–20)
CO2: 29 mmol/L (ref 22–32)
Calcium: 10.2 mg/dL (ref 8.9–10.3)
Chloride: 106 mmol/L (ref 98–111)
Creatinine: 1.02 mg/dL (ref 0.61–1.24)
GFR, Estimated: >60 mL/min (ref >60)
Glucose, Bld: 84 mg/dL (ref 70–99)
Potassium: 4.4 mmol/L (ref 3.5–5.1)
Sodium: 141 mmol/L (ref 135–145)
Total Bilirubin: 0.3 mg/dL (ref 0.0–1.2)
Total Protein: 7.1 g/dL (ref 6.5–8.1)

## 2023-12-09 NOTE — Progress Notes (Signed)
 [facility] [address]  [place], [place] [address] [phone]   Interval Evaluation  Date of Service: 12/09/23 Patient Name: Nathan Prince Patient MRN: [id number] Patient DOB: 07-Jun-1966 Provider: Henreitta Leber, MD  Identifying Statement:  Nathan Prince is a 58 y.o. 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 Pathology: astrocytoma (WHO Grade II) per patient report.  10/02/2011 Surgery Craniotomy for gross total resection performed by Lavonna Monarch, MD. Pathology: anaplastic oligoastrocytoma (WHO Grade III).  - 12/31/2011 Radiation Radiographically stable after conclusion of radiation therapy with concurrent temozolomide 75 mg/m2 daily. Recommended proceeding with five day temozolomide, dosed at 150 mg/m2 for the first cycle and escalated to 200 mg/m2 for subsequent cycles if well tolerated.  01/01/2012 - 10/27/2012 Chemotherapy Eleven cycles of five day temozolomide. Hypometabolic PET scan. Recommended discontinuing temozolomide. Serial MRI monitoring initiated.  02/27/2021 Clinical Event-Other MRI on 02/27/21 reveals new very small area of enhancement medial to resection cavity best seen on axials, concerning for possible disease progression; note made of new left frontal enhancing lesion, which was not present on the 04/11/20 scan.  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 Lavonna Monarch for resection of tumor.  03/27/2021 Radiology Study MRI brain 4 week follow up scan shows growth of the left frontal lesion. Increased left frontal convexity enhancing dural based lesion measuring 1.9 cm AP by 1.0 cm TV by 2.0 cm CC (1.4 x 0.7 x 1.8 cm on 02/27/2021 exam). Recommend resection with Dr. Zachery Conch.  07/31/2021 Radiology Study MRI post radiation for left frontal meningioma and for left temporal oligoastrocytoma surveillance. These two sites are stable. Small enhancing focus medial to left temporal resection cavity is apparent on today's scan comparable to July 2022. Will follow up with a short term MRI in another 6 weeks.  Atypical meningioma of brain (CMS-HCC) 04/20/2021 Initial Diagnosis Atypical meningioma of brain (CMS-HCC)  04/20/2021 Surgery Resection of left frontal convexity mass with Lavonna Monarch. Pathology: atypical meningioma, WHO grade II. TERT negative.  05/31/2021 - 07/16/2021 Radiation IMRT to the left frontal convexity resected typical meningioma total dose 57.6 Gy  07/31/2021 Radiology Study MRI post radiation for left frontal meningioma and for left temporal oligoastrocytoma surveillance. These two sites are stable. Small enhancing focus medial to left temporal resection cavity is apparent on today's scan comparable to July 2022. Will follow up with a short term MRI in another 6 weeks.  11/2022 - Chemotherapy  Tibsovo   Biomarkers:  MGMT Unknown.  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  EGFR Unknown  TERT Unknown   Interval History: Nathan Prince presents today for follow up, now having completed cycle #11 of oral Tibsovo, recent MRI brain.  No new Nathan progressive neurologic complaints today.  Very few auras, remaining on Onfi to 10mg  daily.  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  Depression and mood  swings continue to be a problem  at times.  H+P (11/04/22) Patient presents today to establish care for local treatment of progressive grade 3 astrocytoma, IDHmt.  Nathan and his wife describe decline in function, energy, cognition, language expression since his hospitalization in January for status epilepticus.  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  Currently dosing Keppra 2000mg  BID, Vimpat 200mg  BID, and Depakote 1000mg  BID.  Not on any steroids.  Nathan will be proceeding with Tibsovo therapy per [facility] team recommendations.  Medications: Current Outpatient Medications on File Prior to Visit  Medication Sig Dispense Refill   Apoaequorin (PREVAGEN PO) Take 1 tablet by mouth daily.     Calcium Carb-Cholecalciferol (CALCIUM 600/VITAMIN D3 PO) Take 1 tablet by mouth 2 (two) times daily.     cloBAZam (ONFI) 10 MG tablet Take 1 tablet (10 mg total) by mouth at bedtime. 30 tablet 1   clonazePAM (KLONOPIN) 1 MG disintegrating tablet Take 1 mg by mouth daily as needed.     diazePAM, 20 MG Dose, (VALTOCO 20 MG DOSE) 2 x 10 MG/0.1ML LQPK Place 1 spray into both nostrils daily as needed for up to 5 doses (for seizure lasting over 5 minutes). (Patient not taking: Reported on 09/01/2023) 2 each 3   divalproex (DEPAKOTE) 500 MG DR tablet Take 2 tablets (1,000 mg total) by mouth 2 (two) times daily. MUST MAKE AND KEEP APPOINTMENT FOR FURTHER REFILLS 120 tablet 0   ivosidenib (TIBSOVO) 250 MG tablet Take 2 tablets (500 mg total) by mouth daily. Avoid taking with a high-fat meal. 60 tablet 1   lacosamide (VIMPAT) 200 MG TABS tablet TAKE 1 TABLET BY MOUTH TWICE A DAY 60 tablet 2   levETIRAcetam 1000 MG TB24 Take 2,000 mg by mouth 2 (two) times daily. 112 tablet 2   loratadine (CLARITIN) 10 MG tablet Take 10 mg by mouth daily as needed for allergies.     Multiple Vitamins-Minerals (MULTIVITAMINS THER. W/MINERALS) TABS Take 1 tablet by mouth daily.      prochlorperazine (COMPAZINE) 10 MG  tablet Take 1 tablet (10 mg total) by mouth every 6 (six) hours as needed for nausea Nathan vomiting. 30 tablet 2   pyridoxine (B-6) 100 MG tablet Take by mouth.     sertraline (ZOLOFT) 25 MG tablet TAKE 1 TABLET (25 MG TOTAL) BY MOUTH DAILY. 90 tablet 1   Current Facility-Administered Medications on File Prior to Visit  Medication Dose Route Frequency Provider Last Rate Last Admin   topical emolient (BIAFINE) emulsion   Topical PRN Margaretmary Dys, MD   Given at 11/15/11 1401    Allergies: No Known Allergies Past Medical History:  Past Medical History:  Diagnosis Date   Astrocytoma (HCC) 09/17/2011   subsequent oligodendroglioma in 2022   Depression    Low testosterone 03/30/2013   Radiation 11/04/11-12/19/11   Left temporal brain 59.4 gray in 33 fractions   Skin cancer 09/09/1986   Past Surgical History:  Past Surgical History:  Procedure Laterality Date   BACK SURGERY  09/09/1994   ruptured disc   CRANIOTOMY  09/04/2011   Procedure: CRANIOTOMY TUMOR EXCISION;  Surgeon: Karn Cassis;  Location: [facility];  Service: Neurosurgery;  Laterality: Left;  Left Temporal craniectomy for tumor   MASS BIOPSY  09/05/2011   left temporal lobe   skin cancer removal  1988   Removed skin cancer to left chest   Social History:  Social History  Socioeconomic History   Marital status: Married    Spouse name: Not on file   Number of children: Not on file   Years of education: Not on file   Highest education level: Not on file  Occupational History   Occupation: history teacher at [employer]  Tobacco Use   Smoking status: Never   Smokeless tobacco: Never   Tobacco comments:    never used tobacco  Vaping Use   Vaping status: Never Used  Substance and Sexual Activity   Alcohol use: No    Alcohol/week: 0.0 standard drinks of alcohol   Drug use: No   Sexual activity: Not on file  Other Topics Concern   Not on file  Social History Narrative   Not on file   Social Drivers  of Health   Financial Resource Strain: Not on file  Food Insecurity: No Food Insecurity (09/23/2022)   Hunger Vital Sign    Worried About Running Out of Food in the Last Year: Never true    Ran Out of Food in the Last Year: Never true  Transportation Needs: No Transportation Needs (09/23/2022)   PRAPARE - Administrator, Civil Service (Medical): No    Lack of Transportation (Non-Medical): No  Physical Activity: Not on file  Stress: Not on file  Social Connections: Not on file  Intimate Partner Violence: Not At Risk (09/24/2022)   Humiliation, Afraid, Rape, and Kick questionnaire    Fear of Current Nathan Ex-Partner: No    Emotionally Abused: No    Physically Abused: No    Sexually Abused: No   Family History: No family history on file.  Prince of Systems: Constitutional: Doesn't report fevers, chills Nathan abnormal weight loss Eyes: Doesn't report blurriness of vision Ears, nose, mouth, throat, and face: Doesn't report sore throat Respiratory: Doesn't report cough, dyspnea Nathan wheezes Cardiovascular: Doesn't report palpitation, chest discomfort  Gastrointestinal:  Doesn't report nausea, constipation, diarrhea GU: Doesn't report incontinence Skin: Doesn't report skin rashes Neurological: Per HPI Musculoskeletal: Doesn't report joint pain Behavioral/Psych: Doesn't report anxiety  Physical Exam: Vitals:   12/09/23 1141 12/09/23 1142  BP: (!) 161/97 (!) 140/93  Pulse: 76   Resp: 16   Temp: 98.1 F (36.7 C)   SpO2: 98%     KPS: 80. General: Alert, cooperative, pleasant, in no acute distress Head: Normal EENT: No conjunctival injection Nathan scleral icterus.  Lungs: Resp effort normal Cardiac: Regular rate Abdomen: Non-distended abdomen Skin: No rashes cyanosis Nathan petechiae. Extremities: No clubbing Nathan edema  Neurologic Exam: Mental Status: Awake, alert, attentive to examiner. Oriented to self and environment. Language is modestly impaired with regards to fluency.   Age advanced pyschomotor slowing Cranial Nerves: Visual acuity is grossly normal. Visual fields are full. Extra-ocular movements intact. No ptosis. Face is symmetric Motor: Tone and bulk are normal. Power is full in both arms and legs. Reflexes are symmetric, no pathologic reflexes present.  Sensory: Intact to light touch Gait: Normal.   Labs: I have reviewed the data as listed    Component Value Date/Time   NA 141 12/09/2023 1115   NA 141 10/28/2022 0946   NA 140 03/28/2015 0957   K 4.4 12/09/2023 1115   K 3.9 03/28/2015 0957   CL 106 12/09/2023 1115   CL 104 03/28/2015 0957   CO2 29 12/09/2023 1115   CO2 26 03/28/2015 0957   GLUCOSE 84 12/09/2023 1115   GLUCOSE 124 (H) 03/28/2015 0957   BUN 14 12/09/2023 1115  BUN 16 10/28/2022 0946   BUN 14 03/28/2015 0957   CREATININE 1.02 12/09/2023 1115   CREATININE 1.1 03/28/2015 0957   CALCIUM 10.2 12/09/2023 1115   CALCIUM 9.0 03/28/2015 0957   PROT 7.1 12/09/2023 1115   PROT 6.9 10/28/2022 0946   PROT 7.2 03/28/2015 0957   ALBUMIN 4.5 12/09/2023 1115   ALBUMIN 4.5 10/28/2022 0946   AST 19 12/09/2023 1115   ALT 17 12/09/2023 1115   ALT 27 03/28/2015 0957   ALKPHOS 64 12/09/2023 1115   ALKPHOS 81 03/28/2015 0957   BILITOT 0.3 12/09/2023 1115   GFRNONAA >60 12/09/2023 1115   GFRAA >90 09/05/2011 0402   Lab Results  Component Value Date   WBC 7.5 12/09/2023   NEUTROABS 4.8 12/09/2023   HGB 16.4 12/09/2023   HCT 48.5 12/09/2023   MCV 86.6 12/09/2023   PLT 227 12/09/2023     Assessment/Plan Astrocytoma (HCC)  Seizure (HCC)  Nathan Prince is clinically stable today, now having completed 11 cycles of oral Tibsovo for IDH mutant astrocytoma.    Will con't Keppra (2000mg  BID ER), Vimpat (200mg  BID) and Onfi (10mg  daily) at this time.  We did discuss options for mood issues, including transition to briviact, Nathan careful reduction of Keppra.  Nathan will meet with his epileptologist on 01/13/24.  For astrocytoma, Nathan will  continue with cycle #12 Tibsovo 500mg  daily.    Therapy should be held for the following:  QTC greater than  Platelets less than 100,000  LFT Nathan creatinine greater than 2x ULN  If clinical concerns/contraindications develop  May con't zoloft 25mg  daily for depression symptoms.  We discussed his work situation and stressors; Nathan is currently looking at other options, including working at a smaller school, Nathan even retirement.    We ask that Alessio Bogan return to clinic in 1-2 months following cycle #12 Tibsovo with MRI brain for Prince, Nathan sooner as needed.    All questions were answered. The patient knows to call the clinic with any problems, questions Nathan concerns. No barriers to learning were detected.  The total time spent in the encounter was 30 minutes and more than 50% was on counseling and Prince of test results   Henreitta Leber, MD Medical Director of Neuro-Oncology [facility] [facility] 12/09/23 12:01 PM

## 2023-12-10 ENCOUNTER — Telehealth: Payer: Self-pay | Admitting: Internal Medicine

## 2023-12-10 NOTE — Telephone Encounter (Signed)
 Patient scheduled appointments. Patient is aware of all appointment details.

## 2023-12-24 ENCOUNTER — Other Ambulatory Visit (HOSPITAL_COMMUNITY): Payer: Self-pay

## 2023-12-24 ENCOUNTER — Other Ambulatory Visit: Payer: Self-pay

## 2023-12-24 ENCOUNTER — Other Ambulatory Visit: Payer: Self-pay | Admitting: Pharmacy Technician

## 2023-12-24 ENCOUNTER — Other Ambulatory Visit: Payer: Self-pay | Admitting: Internal Medicine

## 2023-12-24 DIAGNOSIS — C719 Malignant neoplasm of brain, unspecified: Secondary | ICD-10-CM

## 2023-12-24 MED ORDER — TIBSOVO 250 MG PO TABS
500.0000 mg | ORAL_TABLET | Freq: Every day | ORAL | 1 refills | Status: DC
  Filled 2023-12-24: qty 60, 30d supply, fill #0
  Filled 2024-01-27: qty 60, 30d supply, fill #1

## 2023-12-24 NOTE — Progress Notes (Signed)
 Specialty Pharmacy Refill Coordination Note  Nathan Prince is a 58 y.o. male contacted today regarding refills of specialty medication(s) Ivosidenib (Tibsovo)   Patient requested (Patient-Rptd) Delivery   Delivery date: (Patient-Rptd) 12/31/23   Verified address: (Patient-Rptd) [address]   Medication will be filled on 12/30/23.  This fill date is pending response to refill request from provider. Left patient voicemail if they have not received fill by intended date they must follow up with pharmacy.

## 2023-12-30 ENCOUNTER — Other Ambulatory Visit: Payer: Self-pay

## 2024-01-08 ENCOUNTER — Other Ambulatory Visit: Payer: Self-pay | Admitting: *Deleted

## 2024-01-08 ENCOUNTER — Inpatient Hospital Stay
Admission: RE | Admit: 2024-01-08 | Discharge: 2024-01-08 | Disposition: A | Discharge status: Home / Self Care | Payer: Self-pay | Source: Ambulatory Visit | Attending: Internal Medicine | Admitting: Internal Medicine

## 2024-01-08 ENCOUNTER — Other Ambulatory Visit: Payer: Self-pay | Admitting: Internal Medicine

## 2024-01-08 ENCOUNTER — Telehealth: Payer: Self-pay | Admitting: *Deleted

## 2024-01-08 DIAGNOSIS — C719 Malignant neoplasm of brain, unspecified: Secondary | ICD-10-CM

## 2024-01-08 NOTE — Telephone Encounter (Signed)
 Requested that [facility] send images from MRI brain done on 01/06/24 to [facility].  Email sent to [employer] to please link images to outside imaging order.

## 2024-01-09 ENCOUNTER — Other Ambulatory Visit: Payer: Self-pay

## 2024-01-09 MED ORDER — SERTRALINE HCL 25 MG PO TABS: 25.0000 mg | ORAL_TABLET | Freq: Every day | ORAL | 0 refills | Status: DC

## 2024-01-12 NOTE — Telephone Encounter (Signed)
 Refill completed on 01/09/2024

## 2024-01-13 ENCOUNTER — Inpatient Hospital Stay: Payer: Self-pay | Attending: Internal Medicine

## 2024-01-13 ENCOUNTER — Inpatient Hospital Stay (HOSPITAL_BASED_OUTPATIENT_CLINIC_OR_DEPARTMENT_OTHER): Payer: Self-pay | Admitting: Internal Medicine

## 2024-01-13 VITALS — BP 132/88 | HR 78 | Temp 98.9°F | Resp 18 | Ht 70.0 in | Wt 226.1 lb

## 2024-01-13 DIAGNOSIS — Z566 Other physical and mental strain related to work: Secondary | ICD-10-CM | POA: Insufficient documentation

## 2024-01-13 DIAGNOSIS — Z79899 Other long term (current) drug therapy: Secondary | ICD-10-CM | POA: Diagnosis not present

## 2024-01-13 DIAGNOSIS — Z9221 Personal history of antineoplastic chemotherapy: Secondary | ICD-10-CM | POA: Insufficient documentation

## 2024-01-13 DIAGNOSIS — Z85828 Personal history of other malignant neoplasm of skin: Secondary | ICD-10-CM | POA: Diagnosis not present

## 2024-01-13 DIAGNOSIS — C719 Malignant neoplasm of brain, unspecified: Secondary | ICD-10-CM

## 2024-01-13 DIAGNOSIS — R569 Unspecified convulsions: Secondary | ICD-10-CM | POA: Diagnosis not present

## 2024-01-13 DIAGNOSIS — C712 Malignant neoplasm of temporal lobe: Secondary | ICD-10-CM | POA: Diagnosis present

## 2024-01-13 DIAGNOSIS — Z923 Personal history of irradiation: Secondary | ICD-10-CM | POA: Diagnosis not present

## 2024-01-13 LAB — CBC WITH DIFFERENTIAL (CANCER CENTER ONLY)
Abs Immature Granulocytes: 0.01 10*3/uL (ref 0.00–0.07)
Basophils Absolute: 0 10*3/uL (ref 0.0–0.1)
Basophils Relative: 1 %
Eosinophils Absolute: 0 10*3/uL (ref 0.0–0.5)
Eosinophils Relative: 1 %
HCT: 47.2 % (ref 39.0–52.0)
Hemoglobin: 16 g/dL (ref 13.0–17.0)
Immature Granulocytes: 0 %
Lymphocytes Relative: 27 %
Lymphs Abs: 1.8 10*3/uL (ref 0.7–4.0)
MCH: 29.4 pg (ref 26.0–34.0)
MCHC: 33.9 g/dL (ref 30.0–36.0)
MCV: 86.6 fL (ref 80.0–100.0)
Monocytes Absolute: 0.5 10*3/uL (ref 0.1–1.0)
Monocytes Relative: 8 %
Neutro Abs: 4.4 10*3/uL (ref 1.7–7.7)
Neutrophils Relative %: 63 %
Platelet Count: 210 10*3/uL (ref 150–400)
RBC: 5.45 MIL/uL (ref 4.22–5.81)
RDW: 12.6 % (ref 11.5–15.5)
WBC Count: 6.8 10*3/uL (ref 4.0–10.5)
nRBC: 0 % (ref 0.0–0.2)

## 2024-01-13 LAB — CMP (CANCER CENTER ONLY)
ALT: 17 U/L (ref 0–44)
AST: 19 U/L (ref 15–41)
Albumin: 4.5 g/dL (ref 3.5–5.0)
Alkaline Phosphatase: 68 U/L (ref 38–126)
Anion gap: 6 (ref 5–15)
BUN: 16 mg/dL (ref 6–20)
CO2: 31 mmol/L (ref 22–32)
Calcium: 9.7 mg/dL (ref 8.9–10.3)
Chloride: 104 mmol/L (ref 98–111)
Creatinine: 1 mg/dL (ref 0.61–1.24)
GFR, Estimated: >60 mL/min (ref >60)
Glucose, Bld: 99 mg/dL (ref 70–99)
Potassium: 4.5 mmol/L (ref 3.5–5.1)
Sodium: 141 mmol/L (ref 135–145)
Total Bilirubin: 0.5 mg/dL (ref 0.0–1.2)
Total Protein: 7 g/dL (ref 6.5–8.1)

## 2024-01-13 NOTE — Progress Notes (Signed)
 [facility] at [facility] [address]  [place], [place] [address] [phone]   Interval Evaluation  Date of Service: 01/13/24 Patient Name: Nathan Prince Patient MRN: [id number] Patient DOB: 09-Oct-1965 Provider: Mamie Searles, MD  Identifying Statement:  Nathan Prince is a 58 y.o. 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 Pathology: astrocytoma (WHO Grade II) per patient report.  10/02/2011 Surgery Craniotomy for gross total resection performed by Sedalia Dacosta, MD. Pathology: anaplastic oligoastrocytoma (WHO Grade III).  - 12/31/2011 Radiation Radiographically stable after conclusion of radiation therapy with concurrent temozolomide  75 mg/m2 daily. Recommended proceeding with five day temozolomide , dosed at 150 mg/m2 for the first cycle and escalated to 200 mg/m2 for subsequent cycles if well tolerated.  01/01/2012 - 10/27/2012 Chemotherapy Eleven cycles of five day temozolomide . Hypometabolic PET scan. Recommended discontinuing temozolomide . Serial MRI monitoring initiated.  02/27/2021 Clinical Event-Other MRI on 02/27/21 reveals new very small area of enhancement medial to resection cavity best seen on axials, concerning for possible disease progression; note made of new left frontal enhancing lesion, which was not present on the 04/11/20 scan.  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 Sedalia Dacosta for resection of tumor.  03/27/2021 Radiology Study MRI brain 4 week follow up scan shows growth of the left frontal lesion. Increased left frontal convexity enhancing dural based lesion measuring 1.9 cm AP by 1.0 cm TV by 2.0 cm CC (1.4 x 0.7 x 1.8 cm on 02/27/2021 exam). Recommend resection with Dr. Reather Campbell.  07/31/2021 Radiology Study MRI post radiation for left frontal meningioma and for left temporal oligoastrocytoma surveillance. These two sites are stable. Small enhancing focus medial to left temporal resection cavity is apparent on today's scan comparable to July 2022. Will follow up with a short term MRI in another 6 weeks.  Atypical meningioma of brain (CMS-HCC) 04/20/2021 Initial Diagnosis Atypical meningioma of brain (CMS-HCC)  04/20/2021 Surgery Resection of left frontal convexity mass with Sedalia Dacosta. Pathology: atypical meningioma, WHO grade II. TERT negative.  05/31/2021 - 07/16/2021 Radiation IMRT to the left frontal convexity resected typical meningioma total dose 57.6 Gy  07/31/2021 Radiology Study MRI post radiation for left frontal meningioma and for left temporal oligoastrocytoma surveillance. These two sites are stable. Small enhancing focus medial to left temporal resection cavity is apparent on today's scan comparable to July 2022. Will follow up with a short term MRI in another 6 weeks.  11/2022 - Chemotherapy  Tibsovo    Biomarkers:  MGMT Unknown.  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  EGFR Unknown  TERT Unknown   Interval History: Nathan Prince presents today for follow up, now having completed cycle #12 of oral Tibsovo , recent MRI brain.  No new or progressive neurologic complaints today.  Still with very good control of seizures and auras, remaining on Onfi  to 10mg  daily.  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  Depression and  mood swings continue to be a problem at times.  H+P (11/04/22) Patient presents today to establish care for local treatment of progressive grade 3 astrocytoma, IDHmt.  He and his wife describe decline in function, energy, cognition, language expression since his hospitalization in January for status epilepticus.  He is still teaching full time, and is having difficulty in class with communication and overall energy.  Currently dosing Keppra  2000mg  BID, Vimpat  200mg  BID, and Depakote  1000mg  BID.  Not on any steroids.  He will be proceeding with Tibsovo  therapy per [facility] team recommendations.  Medications: Current Outpatient Medications on File Prior to Visit  Medication Sig Dispense Refill   Apoaequorin (PREVAGEN PO) Take 1 tablet by mouth daily.     Calcium Carb-Cholecalciferol (CALCIUM 600/VITAMIN D3 PO) Take 1 tablet by mouth 2 (two) times daily.     cloBAZam  (ONFI ) 10 MG tablet Take 1 tablet (10 mg total) by mouth at bedtime. 30 tablet 1   clonazePAM (KLONOPIN) 1 MG disintegrating tablet Take 1 mg by mouth daily as needed.     diazePAM , 20 MG Dose, (VALTOCO  20 MG DOSE) 2 x 10 MG/0.1ML LQPK Place 1 spray into both nostrils daily as needed for up to 5 doses (for seizure lasting over 5 minutes). (Patient not taking: Reported on 11/04/2022) 2 each 3   divalproex  (DEPAKOTE ) 500 MG DR tablet Take 2 tablets (1,000 mg total) by mouth 2 (two) times daily. MUST MAKE AND KEEP APPOINTMENT FOR FURTHER REFILLS (Patient not taking: Reported on 12/09/2023) 120 tablet 0   ivosidenib  (TIBSOVO ) 250 MG tablet Take 2 tablets (500 mg total) by mouth daily. Avoid taking with a high-fat meal. 60 tablet 1   lacosamide  (VIMPAT ) 200 MG TABS tablet TAKE 1 TABLET BY MOUTH TWICE A DAY 60 tablet 2   levETIRAcetam  1000 MG TB24 Take 2,000 mg by mouth 2 (two) times daily. 112 tablet 2   loratadine (CLARITIN) 10 MG tablet Take 10 mg by mouth daily as needed for allergies.     Multiple Vitamins-Minerals (MULTIVITAMINS THER. W/MINERALS)  TABS Take 1 tablet by mouth daily.      prochlorperazine  (COMPAZINE ) 10 MG tablet Take 1 tablet (10 mg total) by mouth every 6 (six) hours as needed for nausea or vomiting. 30 tablet 2   pyridoxine (B-6) 100 MG tablet Take by mouth.     sertraline  (ZOLOFT ) 25 MG tablet Take 1 tablet (25 mg total) by mouth daily. 90 tablet 0   Current Facility-Administered Medications on File Prior to Visit  Medication Dose Route Frequency Provider Last Rate Last Admin   topical emolient (BIAFINE) emulsion   Topical PRN Kenith Payer, MD   Given at 11/15/11 1401    Allergies: No Known Allergies Past Medical History:  Past Medical History:  Diagnosis Date   Astrocytoma (HCC) 09/17/2011   subsequent oligodendroglioma in 2022   Depression    Low testosterone  03/30/2013   Radiation 11/04/11-12/19/11   Left temporal brain 59.4 gray in 33 fractions   Skin cancer 09/09/1986   Past Surgical History:  Past Surgical History:  Procedure Laterality Date   BACK SURGERY  09/09/1994   ruptured disc   CRANIOTOMY  09/04/2011   Procedure: CRANIOTOMY TUMOR EXCISION;  Surgeon: Adelbert Adler;  Location: [facility];  Service: Neurosurgery;  Laterality: Left;  Left Temporal craniectomy for tumor   MASS BIOPSY  09/05/2011   left temporal lobe   skin cancer removal  1988   Removed skin cancer  to left chest   Social History:  Social History   Socioeconomic History   Marital status: Married    Spouse name: Not on file   Number of children: Not on file   Years of education: Not on file   Highest education level: Not on file  Occupational History   Occupation: history teacher at [employer]  Tobacco Use   Smoking status: Never   Smokeless tobacco: Never   Tobacco comments:    never used tobacco  Vaping Use   Vaping status: Never Used  Substance and Sexual Activity   Alcohol use: No    Alcohol/week: 0.0 standard drinks of alcohol   Drug use: No   Sexual activity: Not on file  Other Topics Concern    Not on file  Social History Narrative   Not on file   Social Drivers of Health   Financial Resource Strain: Not on file  Food Insecurity: No Food Insecurity (09/23/2022)   Hunger Vital Sign    Worried About Running Out of Food in the Last Year: Never true    Ran Out of Food in the Last Year: Never true  Transportation Needs: No Transportation Needs (09/23/2022)   PRAPARE - Administrator, Civil Service (Medical): No    Lack of Transportation (Non-Medical): No  Physical Activity: Not on file  Stress: Not on file  Social Connections: Not on file  Intimate Partner Violence: Not At Risk (09/24/2022)   Humiliation, Afraid, Rape, and Kick questionnaire    Fear of Current or Ex-Partner: No    Emotionally Abused: No    Physically Abused: No    Sexually Abused: No   Family History: No family history on file.  Review of Systems: Constitutional: Doesn't report fevers, chills or abnormal weight loss Eyes: Doesn't report blurriness of vision Ears, nose, mouth, throat, and face: Doesn't report sore throat Respiratory: Doesn't report cough, dyspnea or wheezes Cardiovascular: Doesn't report palpitation, chest discomfort  Gastrointestinal:  Doesn't report nausea, constipation, diarrhea GU: Doesn't report incontinence Skin: Doesn't report skin rashes Neurological: Per HPI Musculoskeletal: Doesn't report joint pain Behavioral/Psych: Doesn't report anxiety  Physical Exam: Vitals:   01/13/24 0915  BP: 132/88  Pulse: 78  Resp: 18  Temp: 98.9 F (37.2 C)  SpO2: 96%     KPS: 80. General: Alert, cooperative, pleasant, in no acute distress Head: Normal EENT: No conjunctival injection or scleral icterus.  Lungs: Resp effort normal Cardiac: Regular rate Abdomen: Non-distended abdomen Skin: No rashes cyanosis or petechiae. Extremities: No clubbing or edema  Neurologic Exam: Mental Status: Awake, alert, attentive to examiner. Oriented to self and environment. Language is  modestly impaired with regards to fluency.  Age advanced pyschomotor slowing Cranial Nerves: Visual acuity is grossly normal. Visual fields are full. Extra-ocular movements intact. No ptosis. Face is symmetric Motor: Tone and bulk are normal. Power is full in both arms and legs. Reflexes are symmetric, no pathologic reflexes present.  Sensory: Intact to light touch Gait: Normal.   Labs: I have reviewed the data as listed    Component Value Date/Time   NA 141 12/09/2023 1115   NA 141 10/28/2022 0946   NA 140 03/28/2015 0957   K 4.4 12/09/2023 1115   K 3.9 03/28/2015 0957   CL 106 12/09/2023 1115   CL 104 03/28/2015 0957   CO2 29 12/09/2023 1115   CO2 26 03/28/2015 0957   GLUCOSE 84 12/09/2023 1115   GLUCOSE 124 (H) 03/28/2015 0957  BUN 14 12/09/2023 1115   BUN 16 10/28/2022 0946   BUN 14 03/28/2015 0957   CREATININE 1.02 12/09/2023 1115   CREATININE 1.1 03/28/2015 0957   CALCIUM 10.2 12/09/2023 1115   CALCIUM 9.0 03/28/2015 0957   PROT 7.1 12/09/2023 1115   PROT 6.9 10/28/2022 0946   PROT 7.2 03/28/2015 0957   ALBUMIN 4.5 12/09/2023 1115   ALBUMIN 4.5 10/28/2022 0946   AST 19 12/09/2023 1115   ALT 17 12/09/2023 1115   ALT 27 03/28/2015 0957   ALKPHOS 64 12/09/2023 1115   ALKPHOS 81 03/28/2015 0957   BILITOT 0.3 12/09/2023 1115   GFRNONAA >60 12/09/2023 1115   GFRAA >90 09/05/2011 0402   Lab Results  Component Value Date   WBC 7.5 12/09/2023   NEUTROABS 4.8 12/09/2023   HGB 16.4 12/09/2023   HCT 48.5 12/09/2023   MCV 86.6 12/09/2023   PLT 227 12/09/2023   Imaging:  [facility] Clinician Interpretation: I have personally reviewed the CNS images as listed.  My interpretation, in the context of the patient's clinical presentation, is treatment effect vs true progression  No results found.  Assessment/Plan Astrocytoma (HCC)  Seizure (HCC)  Nathan Prince is clinically stable today, now having completed 12 cycles of oral Tibsovo  for IDH mutant astrocytoma.  MRI  brain demonstrates small novel focus of enhancement within treated left temporal lobe; etiology is unclear at this time.  There is no meaningful change in T2 signal burden over the past year since Tibsovo  was started.   Will con't Keppra  (2000mg  BID ER), Vimpat  (200mg  BID) and Onfi  (10mg  daily) at this time.  We did discuss options for mood issues, including transition to briviact, or careful reduction of Keppra .  He will meet with his epileptologist later today.  For astrocytoma, he will continue with cycle #13 Tibsovo  500mg  daily.    Therapy should be held for the following:  QTC greater than  Platelets less than 100,000  LFT or creatinine greater than 2x ULN  If clinical concerns/contraindications develop  May con't zoloft  25mg  daily for depression symptoms.  We discussed his work situation and stressors again today.  We ask that Nathan Prince return to clinic in 1 months following cycle #13 Tibsovo  with MRI brain for review, or sooner as needed.    All questions were answered. The patient knows to call the clinic with any problems, questions or concerns. No barriers to learning were detected.  The total time spent in the encounter was 40 minutes and more than 50% was on counseling and review of test results   Mamie Searles, MD Medical Director of Neuro-Oncology [facility] 01/13/24 9:00 AM

## 2024-01-20 ENCOUNTER — Other Ambulatory Visit (HOSPITAL_COMMUNITY): Payer: Self-pay

## 2024-01-21 ENCOUNTER — Other Ambulatory Visit: Payer: Self-pay

## 2024-01-21 ENCOUNTER — Other Ambulatory Visit: Payer: Self-pay | Admitting: Internal Medicine

## 2024-01-27 ENCOUNTER — Other Ambulatory Visit: Payer: Self-pay

## 2024-01-27 NOTE — Progress Notes (Signed)
 Specialty Pharmacy Refill Coordination Note  Nathan Prince is a 58 y.o. male contacted today regarding refills of specialty medication(s) Ivosidenib  (Tibsovo )   Patient requested Delivery   Delivery date: 02/06/24   Verified address: [address]   Medication will be filled on 02/05/24.

## 2024-02-05 ENCOUNTER — Inpatient Hospital Stay
Admission: RE | Admit: 2024-02-05 | Discharge: 2024-02-05 | Disposition: A | Discharge status: Home / Self Care | Payer: Self-pay | Source: Ambulatory Visit | Attending: Internal Medicine | Admitting: Internal Medicine

## 2024-02-05 ENCOUNTER — Encounter: Payer: Self-pay | Admitting: *Deleted

## 2024-02-05 ENCOUNTER — Other Ambulatory Visit: Payer: Self-pay | Admitting: *Deleted

## 2024-02-05 ENCOUNTER — Other Ambulatory Visit: Payer: Self-pay

## 2024-02-05 DIAGNOSIS — C719 Malignant neoplasm of brain, unspecified: Secondary | ICD-10-CM

## 2024-02-05 NOTE — Progress Notes (Signed)
 Requested that [facility] send images from MRI brain done on 02/05/24 to [facility].  Demographic sheet routed to [facility].

## 2024-02-09 ENCOUNTER — Telehealth: Payer: Self-pay | Admitting: Internal Medicine

## 2024-02-10 ENCOUNTER — Inpatient Hospital Stay

## 2024-02-10 ENCOUNTER — Inpatient Hospital Stay: Admitting: Internal Medicine

## 2024-02-14 ENCOUNTER — Other Ambulatory Visit: Payer: Self-pay | Admitting: Internal Medicine

## 2024-02-19 ENCOUNTER — Telehealth: Payer: Self-pay | Admitting: *Deleted

## 2024-02-19 NOTE — Telephone Encounter (Signed)
 PC to patient, no answer, left VM - requested that patient contact our scheduling department to schedule his F/U appointment, [phone].

## 2024-02-25 ENCOUNTER — Other Ambulatory Visit: Payer: Self-pay

## 2024-02-26 ENCOUNTER — Other Ambulatory Visit: Payer: Self-pay

## 2024-02-26 ENCOUNTER — Other Ambulatory Visit: Payer: Self-pay | Admitting: Internal Medicine

## 2024-02-26 DIAGNOSIS — C719 Malignant neoplasm of brain, unspecified: Secondary | ICD-10-CM

## 2024-02-26 MED ORDER — TIBSOVO 250 MG PO TABS
500.0000 mg | ORAL_TABLET | Freq: Every day | ORAL | 1 refills | Status: DC
  Filled 2024-02-26: qty 60, 30d supply, fill #0
  Filled 2024-03-26: qty 60, 30d supply, fill #1

## 2024-02-26 NOTE — Progress Notes (Signed)
 Specialty Pharmacy Refill Coordination Note  Nathan Prince is a 58 y.o. male contacted today regarding refills of specialty medication(s) Ivosidenib  (Tibsovo )   Patient requested Delivery   Delivery date: 03/01/24   Verified address: [address] [place] [address]   Medication will be filled on 02/27/24. This fill date is pending response to refill request from provider. Patient is aware and if they have not received fill by intended date they must follow up with pharmacy. Patient aware that medication may not be sent in until after appointment on 03/02/24.

## 2024-02-26 NOTE — Progress Notes (Signed)
 Specialty Pharmacy Ongoing Clinical Assessment Note  Nathan Prince is a 58 y.o. male who is being followed by the specialty pharmacy service for RxSp Oncology   Patient's specialty medication(s) reviewed today: Ivosidenib  (Tibsovo )   Missed doses in the last 4 weeks: 0   Patient/Caregiver did not have any additional questions or concerns.   Therapeutic benefit summary: Patient is achieving benefit   Adverse events/side effects summary: No adverse events/side effects   Patient's therapy is appropriate to: Continue    Goals Addressed             This Visit's Progress    Slow Disease Progression       Patient is on track. Patient will maintain adherence. Per visit on 5/6, patient is clinically stable and will plan to have MRI repeated before June appt.        Follow up: 3 months  [facility]

## 2024-03-01 ENCOUNTER — Other Ambulatory Visit: Payer: Self-pay

## 2024-03-02 ENCOUNTER — Inpatient Hospital Stay: Admitting: Internal Medicine

## 2024-03-02 ENCOUNTER — Inpatient Hospital Stay: Attending: Internal Medicine

## 2024-03-02 VITALS — BP 144/83 | HR 75 | Temp 97.5°F | Resp 20 | Wt 227.9 lb

## 2024-03-02 DIAGNOSIS — C719 Malignant neoplasm of brain, unspecified: Secondary | ICD-10-CM

## 2024-03-02 DIAGNOSIS — C712 Malignant neoplasm of temporal lobe: Secondary | ICD-10-CM | POA: Insufficient documentation

## 2024-03-02 DIAGNOSIS — Z85828 Personal history of other malignant neoplasm of skin: Secondary | ICD-10-CM | POA: Diagnosis not present

## 2024-03-02 DIAGNOSIS — Z9221 Personal history of antineoplastic chemotherapy: Secondary | ICD-10-CM | POA: Insufficient documentation

## 2024-03-02 DIAGNOSIS — R569 Unspecified convulsions: Secondary | ICD-10-CM | POA: Insufficient documentation

## 2024-03-02 DIAGNOSIS — Z79899 Other long term (current) drug therapy: Secondary | ICD-10-CM | POA: Insufficient documentation

## 2024-03-02 DIAGNOSIS — Z923 Personal history of irradiation: Secondary | ICD-10-CM | POA: Insufficient documentation

## 2024-03-02 LAB — CMP (CANCER CENTER ONLY)
ALT: 20 U/L (ref 0–44)
AST: 19 U/L (ref 15–41)
Albumin: 4.4 g/dL (ref 3.5–5.0)
Alkaline Phosphatase: 68 U/L (ref 38–126)
Anion gap: 7 (ref 5–15)
BUN: 19 mg/dL (ref 6–20)
CO2: 27 mmol/L (ref 22–32)
Calcium: 9.7 mg/dL (ref 8.9–10.3)
Chloride: 108 mmol/L (ref 98–111)
Creatinine: 1 mg/dL (ref 0.61–1.24)
GFR, Estimated: >60 mL/min (ref >60)
Glucose, Bld: 88 mg/dL (ref 70–99)
Potassium: 3.9 mmol/L (ref 3.5–5.1)
Sodium: 142 mmol/L (ref 135–145)
Total Bilirubin: 0.5 mg/dL (ref 0.0–1.2)
Total Protein: 6.9 g/dL (ref 6.5–8.1)

## 2024-03-02 LAB — CBC WITH DIFFERENTIAL (CANCER CENTER ONLY)
Abs Immature Granulocytes: 0.03 10*3/uL (ref 0.00–0.07)
Basophils Absolute: 0.1 10*3/uL (ref 0.0–0.1)
Basophils Relative: 1 %
Eosinophils Absolute: 0.1 10*3/uL (ref 0.0–0.5)
Eosinophils Relative: 1 %
HCT: 47.2 % (ref 39.0–52.0)
Hemoglobin: 16 g/dL (ref 13.0–17.0)
Immature Granulocytes: 0 %
Lymphocytes Relative: 26 %
Lymphs Abs: 1.7 10*3/uL (ref 0.7–4.0)
MCH: 29.5 pg (ref 26.0–34.0)
MCHC: 33.9 g/dL (ref 30.0–36.0)
MCV: 87.1 fL (ref 80.0–100.0)
Monocytes Absolute: 0.5 10*3/uL (ref 0.1–1.0)
Monocytes Relative: 7 %
Neutro Abs: 4.3 10*3/uL (ref 1.7–7.7)
Neutrophils Relative %: 65 %
Platelet Count: 209 10*3/uL (ref 150–400)
RBC: 5.42 MIL/uL (ref 4.22–5.81)
RDW: 12.2 % (ref 11.5–15.5)
WBC Count: 6.7 10*3/uL (ref 4.0–10.5)
nRBC: 0 % (ref 0.0–0.2)

## 2024-03-02 NOTE — Progress Notes (Signed)
 [facility] at [facility] [address]  [place], [place] [address] [phone]   Interval Evaluation  Date of Service: 03/02/24 Patient Name: Nathan Prince Patient MRN: [id number] Patient DOB: May 28, 1966 Provider: Arthea MARLA Manns, MD  Identifying Statement:  Nathan Prince is a 58 y.o. 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 Pathology: astrocytoma (WHO Grade II) per patient report.  10/02/2011 Surgery Craniotomy for gross total resection performed by Peggye Li, MD. Pathology: anaplastic oligoastrocytoma (WHO Grade III).  - 12/31/2011 Radiation Radiographically stable after conclusion of radiation therapy with concurrent temozolomide  75 mg/m2 daily. Recommended proceeding with five day temozolomide , dosed at 150 mg/m2 for the first cycle and escalated to 200 mg/m2 for subsequent cycles if well tolerated.  01/01/2012 - 10/27/2012 Chemotherapy Eleven cycles of five day temozolomide . Hypometabolic PET scan. Recommended discontinuing temozolomide . Serial MRI monitoring initiated.  02/27/2021 Clinical Event-Other MRI on 02/27/21 reveals new very small area of enhancement medial to resection cavity best seen on axials, concerning for possible disease progression; note made of new left frontal enhancing lesion, which was not present on the 04/11/20 scan.  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 Peggye Li for resection of tumor.  03/27/2021 Radiology Study MRI brain 4 week follow up scan shows growth of the left frontal lesion. Increased left frontal convexity enhancing dural based lesion measuring 1.9 cm AP by 1.0 cm TV by 2.0 cm CC (1.4 x 0.7 x 1.8 cm on 02/27/2021 exam). Recommend resection with Dr. Li.  07/31/2021 Radiology Study MRI post radiation for left frontal meningioma and for left temporal oligoastrocytoma surveillance. These two sites are stable. Small enhancing focus medial to left temporal resection cavity is apparent on today's scan comparable to July 2022. Will follow up with a short term MRI in another 6 weeks.  Atypical meningioma of brain (CMS-HCC) 04/20/2021 Initial Diagnosis Atypical meningioma of brain (CMS-HCC)  04/20/2021 Surgery Resection of left frontal convexity mass with Peggye Li. Pathology: atypical meningioma, WHO grade II. TERT negative.  05/31/2021 - 07/16/2021 Radiation IMRT to the left frontal convexity resected typical meningioma total dose 57.6 Gy  07/31/2021 Radiology Study MRI post radiation for left frontal meningioma and for left temporal oligoastrocytoma surveillance. These two sites are stable. Small enhancing focus medial to left temporal resection cavity is apparent on today's scan comparable to July 2022. Will follow up with a short term MRI in another 6 weeks.  11/2022 - Chemotherapy  Nathan Prince    Biomarkers:  MGMT Unknown.  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  EGFR Unknown  TERT Unknown   Interval History: Nathan Prince presents today for follow up, now having completed cycle #13 of oral Nathan Prince , recent MRI brain.  No new or progressive neurologic complaints today.  No significant breakthrough seizures, remains on Onfi  10mg  daily in additiona to Vimpat  200mg  BID, Keppra  2000mg  BID and Depakote  1000mg  BID.  No changes with work stressors.  Depression  and mood swings continue to be a problem at times.  H+P (11/04/22) Patient presents today to establish care for local treatment of progressive grade 3 astrocytoma, IDHmt.  He and his wife describe decline in function, energy, cognition, language expression since his hospitalization in January for status epilepticus.  He is still teaching full time, and is having difficulty in class with communication and overall energy.  Currently dosing Keppra  2000mg  BID, Vimpat  200mg  BID, and Depakote  1000mg  BID.  Not on any steroids.  He will be proceeding with Nathan Prince  therapy per [facility] team recommendations.  Medications: Current Outpatient Medications on File Prior to Visit  Medication Sig Dispense Refill   Apoaequorin (PREVAGEN PO) Take 1 tablet by mouth daily.     Calcium Carb-Cholecalciferol (CALCIUM 600/VITAMIN D3 PO) Take 1 tablet by mouth 2 (two) times daily.     cloBAZam  (ONFI ) 10 MG tablet Take 1 tablet (10 mg total) by mouth at bedtime. 30 tablet 1   clonazePAM (KLONOPIN) 1 MG disintegrating tablet Take 1 mg by mouth daily as needed.     diazePAM , 20 MG Dose, (VALTOCO  20 MG DOSE) 2 x 10 MG/0.1ML LQPK Place 1 spray into both nostrils daily as needed for up to 5 doses (for seizure lasting over 5 minutes). (Patient not taking: Reported on 01/13/2024) 2 each 3   divalproex  (DEPAKOTE ) 500 MG DR tablet Take 2 tablets (1,000 mg total) by mouth 2 (two) times daily. MUST MAKE AND KEEP APPOINTMENT FOR FURTHER REFILLS (Patient not taking: Reported on 12/09/2023) 120 tablet 0   ivosidenib  (Nathan Prince ) 250 MG tablet Take 2 tablets (500 mg total) by mouth daily. Avoid taking with a high-fat meal. 60 tablet 1   lacosamide  (VIMPAT ) 200 MG TABS tablet TAKE 1 TABLET BY MOUTH TWICE A DAY 60 tablet 3   levETIRAcetam  1000 MG TB24 Take 2,000 mg by mouth 2 (two) times daily. 112 tablet 2   loratadine (CLARITIN) 10 MG tablet Take 10 mg by mouth daily as needed for allergies.     Multiple Vitamins-Minerals (MULTIVITAMINS THER.  W/MINERALS) TABS Take 1 tablet by mouth daily.      prochlorperazine  (COMPAZINE ) 10 MG tablet Take 1 tablet (10 mg total) by mouth every 6 (six) hours as needed for nausea or vomiting. 30 tablet 2   pyridoxine (B-6) 100 MG tablet Take by mouth.     sertraline  (ZOLOFT ) 25 MG tablet TAKE 1 TABLET (25 MG TOTAL) BY MOUTH DAILY. 90 tablet 0   Current Facility-Administered Medications on File Prior to Visit  Medication Dose Route Frequency Provider Last Rate Last Admin   topical emolient (BIAFINE) emulsion   Topical PRN Patrcia Cough, MD   Given at 11/15/11 1401    Allergies: No Known Allergies Past Medical History:  Past Medical History:  Diagnosis Date   Astrocytoma (HCC) 09/17/2011   subsequent oligodendroglioma in 2022   Depression    Low testosterone  03/30/2013   Radiation 11/04/11-12/19/11   Left temporal brain 59.4 gray in 33 fractions   Skin cancer 09/09/1986   Past Surgical History:  Past Surgical History:  Procedure Laterality Date   BACK SURGERY  09/09/1994   ruptured disc   CRANIOTOMY  09/04/2011   Procedure: CRANIOTOMY TUMOR EXCISION;  Surgeon: Catalina CHRISTELLA Stains;  Location: [facility];  Service: Neurosurgery;  Laterality: Left;  Left Temporal craniectomy for tumor   MASS BIOPSY  09/05/2011   left temporal lobe   skin cancer removal  1988   Removed skin cancer to left chest  Social History:  Social History   Socioeconomic History   Marital status: Married    Spouse name: Not on file   Number of children: Not on file   Years of education: Not on file   Highest education level: Not on file  Occupational History   Occupation: history teacher at [employer]  Tobacco Use   Smoking status: Never   Smokeless tobacco: Never   Tobacco comments:    never used tobacco  Vaping Use   Vaping status: Never Used  Substance and Sexual Activity   Alcohol use: No    Alcohol/week: 0.0 standard drinks of alcohol   Drug use: No   Sexual activity: Not on file  Other  Topics Concern   Not on file  Social History Narrative   Not on file   Social Drivers of Health   Financial Resource Strain: Not on file  Food Insecurity: No Food Insecurity (09/23/2022)   Hunger Vital Sign    Worried About Running Out of Food in the Last Year: Never true    Ran Out of Food in the Last Year: Never true  Transportation Needs: No Transportation Needs (09/23/2022)   PRAPARE - Administrator, Civil Service (Medical): No    Lack of Transportation (Non-Medical): No  Physical Activity: Not on file  Stress: Not on file  Social Connections: Not on file  Intimate Partner Violence: Not At Risk (09/24/2022)   Humiliation, Afraid, Rape, and Kick questionnaire    Fear of Current or Ex-Partner: No    Emotionally Abused: No    Physically Abused: No    Sexually Abused: No   Family History: No family history on file.  Review of Systems: Constitutional: Doesn't report fevers, chills or abnormal weight loss Eyes: Doesn't report blurriness of vision Ears, nose, mouth, throat, and face: Doesn't report sore throat Respiratory: Doesn't report cough, dyspnea or wheezes Cardiovascular: Doesn't report palpitation, chest discomfort  Gastrointestinal:  Doesn't report nausea, constipation, diarrhea GU: Doesn't report incontinence Skin: Doesn't report skin rashes Neurological: Per HPI Musculoskeletal: Doesn't report joint pain Behavioral/Psych: Doesn't report anxiety  Physical Exam: Vitals:   03/02/24 0905  BP: (!) 144/83  Pulse: 75  Resp: 20  Temp: (!) 97.5 F (36.4 C)  SpO2: 98%    KPS: 80. General: Alert, cooperative, pleasant, in no acute distress Head: Normal EENT: No conjunctival injection or scleral icterus.  Lungs: Resp effort normal Cardiac: Regular rate Abdomen: Non-distended abdomen Skin: No rashes cyanosis or petechiae. Extremities: No clubbing or edema  Neurologic Exam: Mental Status: Awake, alert, attentive to examiner. Oriented to self and  environment. Language is modestly impaired with regards to fluency.  Age advanced pyschomotor slowing Cranial Nerves: Visual acuity is grossly normal. Visual fields are full. Extra-ocular movements intact. No ptosis. Face is symmetric Motor: Tone and bulk are normal. Power is full in both arms and legs. Reflexes are symmetric, no pathologic reflexes present.  Sensory: Intact to light touch Gait: Normal.   Labs: I have reviewed the data as listed    Component Value Date/Time   NA 141 01/13/2024 0847   NA 141 10/28/2022 0946   NA 140 03/28/2015 0957   K 4.5 01/13/2024 0847   K 3.9 03/28/2015 0957   CL 104 01/13/2024 0847   CL 104 03/28/2015 0957   CO2 31 01/13/2024 0847   CO2 26 03/28/2015 0957   GLUCOSE 99 01/13/2024 0847   GLUCOSE 124 (H) 03/28/2015 0957   BUN 16 01/13/2024 0847  BUN 16 10/28/2022 0946   BUN 14 03/28/2015 0957   CREATININE 1.00 01/13/2024 0847   CREATININE 1.1 03/28/2015 0957   CALCIUM 9.7 01/13/2024 0847   CALCIUM 9.0 03/28/2015 0957   PROT 7.0 01/13/2024 0847   PROT 6.9 10/28/2022 0946   PROT 7.2 03/28/2015 0957   ALBUMIN 4.5 01/13/2024 0847   ALBUMIN 4.5 10/28/2022 0946   AST 19 01/13/2024 0847   ALT 17 01/13/2024 0847   ALT 27 03/28/2015 0957   ALKPHOS 68 01/13/2024 0847   ALKPHOS 81 03/28/2015 0957   BILITOT 0.5 01/13/2024 0847   GFRNONAA >60 01/13/2024 0847   GFRAA >90 09/05/2011 0402   Lab Results  Component Value Date   WBC 6.8 01/13/2024   NEUTROABS 4.4 01/13/2024   HGB 16.0 01/13/2024   HCT 47.2 01/13/2024   MCV 86.6 01/13/2024   PLT 210 01/13/2024   Imaging:  [facility] Clinician Interpretation: I have personally reviewed the CNS images as listed.  My interpretation, in the context of the patient's clinical presentation, is stable disease  No results found.  Assessment/Plan Astrocytoma (HCC)  Seizure (HCC)  Nathan Prince is clinically stable today.  MRI brain demonstrates stable findings.  Will con't Nathan Prince  an additional 3  months per [facility] team recommendation.  Will con't Keppra  (2000mg  BID ER), Vimpat  (200mg  BID) and Onfi  (10mg  daily) at this time.    For astrocytoma, he will continue with Nathan Prince  500mg  daily.    Therapy should be held for the following:  QTC greater than  Platelets less than 100,000  LFT or creatinine greater than 2x ULN  If clinical concerns/contraindications develop  May con't zoloft  25mg  daily for depression symptoms.  We discussed his work situation and stressors again today.  We ask that Nathan Prince return to clinic in 2 months with MRI brain for review, or sooner as needed.    All questions were answered. The patient knows to call the clinic with any problems, questions or concerns. No barriers to learning were detected.  The total time spent in the encounter was 40 minutes and more than 50% was on counseling and review of test results   Arthea MARLA Manns, MD Medical Director of Neuro-Oncology [facility] [place] 03/02/24 8:58 AM

## 2024-03-15 ENCOUNTER — Other Ambulatory Visit: Payer: Self-pay

## 2024-03-26 ENCOUNTER — Other Ambulatory Visit: Payer: Self-pay

## 2024-03-26 NOTE — Progress Notes (Signed)
 Specialty Pharmacy Refill Coordination Note  Nathan Prince is a 58 y.o. male contacted today regarding refills of specialty medication(s) Ivosidenib  (Tibsovo )   Patient requested [name] at [facility] [place] date: 03/30/24   Medication will be filled on 07.21.25.

## 2024-03-29 ENCOUNTER — Other Ambulatory Visit: Payer: Self-pay

## 2024-04-17 ENCOUNTER — Encounter (INDEPENDENT_AMBULATORY_CARE_PROVIDER_SITE_OTHER): Payer: Self-pay

## 2024-04-19 ENCOUNTER — Other Ambulatory Visit: Payer: Self-pay | Admitting: Internal Medicine

## 2024-04-19 ENCOUNTER — Other Ambulatory Visit: Payer: Self-pay

## 2024-04-19 DIAGNOSIS — C719 Malignant neoplasm of brain, unspecified: Secondary | ICD-10-CM

## 2024-04-19 MED ORDER — TIBSOVO 250 MG PO TABS
500.0000 mg | ORAL_TABLET | Freq: Every day | ORAL | 1 refills | Status: DC
  Filled 2024-04-19: qty 60, 30d supply, fill #0

## 2024-04-19 NOTE — Progress Notes (Signed)
 Specialty Pharmacy Refill Coordination Note  Nathan Prince is a 58 y.o. male contacted today regarding refills of specialty medication(s) Ivosidenib  (Tibsovo )   Patient requested Delivery   Delivery date: 04/23/24   Verified address: [address] [place]  [address]   Medication will be filled on 04/22/24.   This fill date is pending response to refill request from provider. Patient is aware and if they have not received fill by intended date they must follow up with pharmacy.

## 2024-04-20 ENCOUNTER — Inpatient Hospital Stay
Admission: RE | Admit: 2024-04-20 | Discharge: 2024-04-20 | Disposition: A | Discharge status: Home / Self Care | Payer: Self-pay | Source: Ambulatory Visit | Attending: Internal Medicine

## 2024-04-20 ENCOUNTER — Other Ambulatory Visit: Payer: Self-pay | Admitting: *Deleted

## 2024-04-20 ENCOUNTER — Encounter: Payer: Self-pay | Admitting: *Deleted

## 2024-04-20 DIAGNOSIS — C719 Malignant neoplasm of brain, unspecified: Secondary | ICD-10-CM

## 2024-04-20 NOTE — Progress Notes (Signed)
 Requested that [facility] send images from MRI brain done on 04/20/24 to [facility]  Email to [employer] to link images to outside images order.

## 2024-04-21 ENCOUNTER — Other Ambulatory Visit: Payer: Self-pay

## 2024-04-22 ENCOUNTER — Inpatient Hospital Stay: Attending: Internal Medicine

## 2024-04-22 ENCOUNTER — Inpatient Hospital Stay (HOSPITAL_BASED_OUTPATIENT_CLINIC_OR_DEPARTMENT_OTHER): Admitting: Internal Medicine

## 2024-04-22 VITALS — BP 137/88 | HR 78 | Temp 98.0°F | Resp 20 | Wt 229.4 lb

## 2024-04-22 DIAGNOSIS — Z85828 Personal history of other malignant neoplasm of skin: Secondary | ICD-10-CM | POA: Insufficient documentation

## 2024-04-22 DIAGNOSIS — C712 Malignant neoplasm of temporal lobe: Secondary | ICD-10-CM | POA: Diagnosis present

## 2024-04-22 DIAGNOSIS — C719 Malignant neoplasm of brain, unspecified: Secondary | ICD-10-CM

## 2024-04-22 DIAGNOSIS — R569 Unspecified convulsions: Secondary | ICD-10-CM | POA: Diagnosis not present

## 2024-04-22 DIAGNOSIS — F32A Depression, unspecified: Secondary | ICD-10-CM | POA: Insufficient documentation

## 2024-04-22 DIAGNOSIS — Z923 Personal history of irradiation: Secondary | ICD-10-CM | POA: Diagnosis not present

## 2024-04-22 LAB — CBC WITH DIFFERENTIAL (CANCER CENTER ONLY)
Abs Immature Granulocytes: 0.02 K/uL (ref 0.00–0.07)
Basophils Absolute: 0 K/uL (ref 0.0–0.1)
Basophils Relative: 1 %
Eosinophils Absolute: 0 K/uL (ref 0.0–0.5)
Eosinophils Relative: 1 %
HCT: 46 % (ref 39.0–52.0)
Hemoglobin: 15.7 g/dL (ref 13.0–17.0)
Immature Granulocytes: 0 %
Lymphocytes Relative: 25 %
Lymphs Abs: 1.7 K/uL (ref 0.7–4.0)
MCH: 29.7 pg (ref 26.0–34.0)
MCHC: 34.1 g/dL (ref 30.0–36.0)
MCV: 87 fL (ref 80.0–100.0)
Monocytes Absolute: 0.6 K/uL (ref 0.1–1.0)
Monocytes Relative: 8 %
Neutro Abs: 4.6 K/uL (ref 1.7–7.7)
Neutrophils Relative %: 65 %
Platelet Count: 217 K/uL (ref 150–400)
RBC: 5.29 MIL/uL (ref 4.22–5.81)
RDW: 12.1 % (ref 11.5–15.5)
WBC Count: 7 K/uL (ref 4.0–10.5)
nRBC: 0 % (ref 0.0–0.2)

## 2024-04-22 LAB — CMP (CANCER CENTER ONLY)
ALT: 16 U/L (ref 0–44)
AST: 16 U/L (ref 15–41)
Albumin: 4.5 g/dL (ref 3.5–5.0)
Alkaline Phosphatase: 72 U/L (ref 38–126)
Anion gap: 5 (ref 5–15)
BUN: 18 mg/dL (ref 6–20)
CO2: 30 mmol/L (ref 22–32)
Calcium: 9.6 mg/dL (ref 8.9–10.3)
Chloride: 106 mmol/L (ref 98–111)
Creatinine: 0.96 mg/dL (ref 0.61–1.24)
GFR, Estimated: >60 mL/min (ref >60)
Glucose, Bld: 101 mg/dL — ABNORMAL HIGH (ref 70–99)
Potassium: 4.3 mmol/L (ref 3.5–5.1)
Sodium: 141 mmol/L (ref 135–145)
Total Bilirubin: 0.4 mg/dL (ref 0.0–1.2)
Total Protein: 6.8 g/dL (ref 6.5–8.1)

## 2024-04-22 NOTE — Progress Notes (Signed)
 [facility] at [facility] [address]  [place], [place] [address] [phone]   Interval Evaluation  Date of Service: 04/22/24 Patient Name: Nathan Prince Patient MRN: [id number] Patient DOB: 1965/10/25 Provider: Arthea MARLA Manns, MD  Identifying Statement:  Nathan Prince is a 58 y.o. 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 Pathology: astrocytoma (WHO Grade II) per patient report.  10/02/2011 Surgery Craniotomy for gross total resection performed by Peggye Li, MD. Pathology: anaplastic oligoastrocytoma (WHO Grade III).  - 12/31/2011 Radiation Radiographically stable after conclusion of radiation therapy with concurrent temozolomide  75 mg/m2 daily. Recommended proceeding with five day temozolomide , dosed at 150 mg/m2 for the first cycle and escalated to 200 mg/m2 for subsequent cycles if well tolerated.  01/01/2012 - 10/27/2012 Chemotherapy Eleven cycles of five day temozolomide . Hypometabolic PET scan. Recommended discontinuing temozolomide . Serial MRI monitoring initiated.  02/27/2021 Clinical Event-Other MRI on 02/27/21 reveals new very small area of enhancement medial to resection cavity best seen on axials, concerning for possible disease progression; note made of new left frontal enhancing lesion, which was not present on the 04/11/20 scan.  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 Peggye Li for resection of tumor.  03/27/2021 Radiology Study MRI brain 4 week follow up scan shows growth of the left frontal lesion. Increased left frontal convexity enhancing dural based lesion measuring 1.9 cm AP by 1.0 cm TV by 2.0 cm CC (1.4 x 0.7 x 1.8 cm on 02/27/2021 exam). Recommend resection with Dr. Li.  07/31/2021 Radiology Study MRI post radiation for left frontal meningioma and for left temporal oligoastrocytoma surveillance. These two sites are stable. Small enhancing focus medial to left temporal resection cavity is apparent on today's scan comparable to July 2022. Will follow up with a short term MRI in another 6 weeks.  Atypical meningioma of brain (CMS-HCC) 04/20/2021 Initial Diagnosis Atypical meningioma of brain (CMS-HCC)  04/20/2021 Surgery Resection of left frontal convexity mass with Peggye Li. Pathology: atypical meningioma, WHO grade II. TERT negative.  05/31/2021 - 07/16/2021 Radiation IMRT to the left frontal convexity resected typical meningioma total dose 57.6 Gy  07/31/2021 Radiology Study MRI post radiation for left frontal meningioma and for left temporal oligoastrocytoma surveillance. These two sites are stable. Small enhancing focus medial to left temporal resection cavity is apparent on today's scan comparable to July 2022. Will follow up with a short term MRI in another 6 weeks.  11/2022 - Chemotherapy  Tibsovo    Biomarkers:  MGMT Unknown.  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  EGFR Unknown  TERT Unknown   Interval History: Nathan Prince presents today for follow up, now having completed additional cycle of oral Tibsovo , recent MRI brain.  No new or progressive neurologic complaints today.  He did have one week period of increased aura frequency, but this week has been back to normal, ~1 per day.  Remains on Onfi  10mg  daily in additiona to  Vimpat  200mg  BID, Keppra   2000mg  BID and Depakote  1000mg  BID.  He is now officially retired.  Depression and mood swings continue to be a problem at times.  H+P (11/04/22) Patient presents today to establish care for local treatment of progressive grade 3 astrocytoma, IDHmt.  He and his wife describe decline in function, energy, cognition, language expression since his hospitalization in January for status epilepticus.  He is still teaching full time, and is having difficulty in class with communication and overall energy.  Currently dosing Keppra  2000mg  BID, Vimpat  200mg  BID, and Depakote  1000mg  BID.  Not on any steroids.  He will be proceeding with Tibsovo  therapy per [facility] team recommendations.  Medications: Current Outpatient Medications on File Prior to Visit  Medication Sig Dispense Refill   Apoaequorin (PREVAGEN PO) Take 1 tablet by mouth daily.     Calcium Carb-Cholecalciferol (CALCIUM 600/VITAMIN D3 PO) Take 1 tablet by mouth 2 (two) times daily.     cloBAZam  (ONFI ) 10 MG tablet Take 1 tablet (10 mg total) by mouth at bedtime. 30 tablet 1   clonazePAM (KLONOPIN) 1 MG disintegrating tablet Take 1 mg by mouth daily as needed.     diazePAM , 20 MG Dose, (VALTOCO  20 MG DOSE) 2 x 10 MG/0.1ML LQPK Place 1 spray into both nostrils daily as needed for up to 5 doses (for seizure lasting over 5 minutes). 2 each 3   divalproex  (DEPAKOTE ) 500 MG DR tablet Take 2 tablets (1,000 mg total) by mouth 2 (two) times daily. MUST MAKE AND KEEP APPOINTMENT FOR FURTHER REFILLS 120 tablet 0   ivosidenib  (TIBSOVO ) 250 MG tablet Take 2 tablets (500 mg total) by mouth daily. Avoid taking with a high-fat meal. 60 tablet 1   lacosamide  (VIMPAT ) 200 MG TABS tablet TAKE 1 TABLET BY MOUTH TWICE A DAY 60 tablet 3   levETIRAcetam  1000 MG TB24 Take 2,000 mg by mouth 2 (two) times daily. 112 tablet 2   loratadine (CLARITIN) 10 MG tablet Take 10 mg by mouth daily as needed for allergies.     Multiple Vitamins-Minerals (MULTIVITAMINS THER. W/MINERALS) TABS  Take 1 tablet by mouth daily.      prochlorperazine  (COMPAZINE ) 10 MG tablet Take 1 tablet (10 mg total) by mouth every 6 (six) hours as needed for nausea or vomiting. 30 tablet 2   pyridoxine (B-6) 100 MG tablet Take by mouth.     sertraline  (ZOLOFT ) 25 MG tablet TAKE 1 TABLET (25 MG TOTAL) BY MOUTH DAILY. 90 tablet 0   Current Facility-Administered Medications on File Prior to Visit  Medication Dose Route Frequency Provider Last Rate Last Admin   topical emolient (BIAFINE) emulsion   Topical PRN Patrcia Cough, MD   Given at 11/15/11 1401    Allergies: No Known Allergies Past Medical History:  Past Medical History:  Diagnosis Date   Astrocytoma (HCC) 09/17/2011   subsequent oligodendroglioma in 2022   Depression    Low testosterone  03/30/2013   Radiation 11/04/11-12/19/11   Left temporal brain 59.4 gray in 33 fractions   Skin cancer 09/09/1986   Past Surgical History:  Past Surgical History:  Procedure Laterality Date   BACK SURGERY  09/09/1994   ruptured disc   CRANIOTOMY  09/04/2011   Procedure: CRANIOTOMY TUMOR EXCISION;  Surgeon: Catalina CHRISTELLA Stains;  Location: [facility];  Service: Neurosurgery;  Laterality: Left;  Left Temporal craniectomy for tumor   MASS BIOPSY  09/05/2011   left temporal lobe   skin cancer removal  1988   Removed  skin cancer to left chest   Social History:  Social History   Socioeconomic History   Marital status: Married    Spouse name: Not on file   Number of children: Not on file   Years of education: Not on file   Highest education level: Not on file  Occupational History   Occupation: history teacher at [employer]  Tobacco Use   Smoking status: Never   Smokeless tobacco: Never   Tobacco comments:    never used tobacco  Vaping Use   Vaping status: Never Used  Substance and Sexual Activity   Alcohol use: No    Alcohol/week: 0.0 standard drinks of alcohol   Drug use: No   Sexual activity: Not on file  Other Topics Concern   Not  on file  Social History Narrative   Not on file   Social Drivers of Health   Financial Resource Strain: Not on file  Food Insecurity: No Food Insecurity (09/23/2022)   Hunger Vital Sign    Worried About Running Out of Food in the Last Year: Never true    Ran Out of Food in the Last Year: Never true  Transportation Needs: No Transportation Needs (09/23/2022)   PRAPARE - Administrator, Civil Service (Medical): No    Lack of Transportation (Non-Medical): No  Physical Activity: Not on file  Stress: Not on file  Social Connections: Not on file  Intimate Partner Violence: Not At Risk (09/24/2022)   Humiliation, Afraid, Rape, and Kick questionnaire    Fear of Current or Ex-Partner: No    Emotionally Abused: No    Physically Abused: No    Sexually Abused: No   Family History: No family history on file.  Review of Systems: Constitutional: Doesn't report fevers, chills or abnormal weight loss Eyes: Doesn't report blurriness of vision Ears, nose, mouth, throat, and face: Doesn't report sore throat Respiratory: Doesn't report cough, dyspnea or wheezes Cardiovascular: Doesn't report palpitation, chest discomfort  Gastrointestinal:  Doesn't report nausea, constipation, diarrhea GU: Doesn't report incontinence Skin: Doesn't report skin rashes Neurological: Per HPI Musculoskeletal: Doesn't report joint pain Behavioral/Psych: Doesn't report anxiety  Physical Exam: Vitals:   04/22/24 1238 04/22/24 1239  BP: (!) 122/91 137/88  Pulse: 78   Resp: 20   Temp: 98 F (36.7 C)   SpO2: 100%     KPS: 80. General: Alert, cooperative, pleasant, in no acute distress Head: Normal EENT: No conjunctival injection or scleral icterus.  Lungs: Resp effort normal Cardiac: Regular rate Abdomen: Non-distended abdomen Skin: No rashes cyanosis or petechiae. Extremities: No clubbing or edema  Neurologic Exam: Mental Status: Awake, alert, attentive to examiner. Oriented to self and  environment. Language is modestly impaired with regards to fluency.  Age advanced pyschomotor slowing Cranial Nerves: Visual acuity is grossly normal. Visual fields are full. Extra-ocular movements intact. No ptosis. Face is symmetric Motor: Tone and bulk are normal. Power is full in both arms and legs. Reflexes are symmetric, no pathologic reflexes present.  Sensory: Intact to light touch Gait: Normal.   Labs: I have reviewed the data as listed    Component Value Date/Time   NA 141 04/22/2024 1204   NA 141 10/28/2022 0946   NA 140 03/28/2015 0957   K 4.3 04/22/2024 1204   K 3.9 03/28/2015 0957   CL 106 04/22/2024 1204   CL 104 03/28/2015 0957   CO2 30 04/22/2024 1204   CO2 26 03/28/2015 0957   GLUCOSE 101 (H) 04/22/2024  1204   GLUCOSE 124 (H) 03/28/2015 0957   BUN 18 04/22/2024 1204   BUN 16 10/28/2022 0946   BUN 14 03/28/2015 0957   CREATININE 0.96 04/22/2024 1204   CREATININE 1.1 03/28/2015 0957   CALCIUM 9.6 04/22/2024 1204   CALCIUM 9.0 03/28/2015 0957   PROT 6.8 04/22/2024 1204   PROT 6.9 10/28/2022 0946   PROT 7.2 03/28/2015 0957   ALBUMIN 4.5 04/22/2024 1204   ALBUMIN 4.5 10/28/2022 0946   AST 16 04/22/2024 1204   ALT 16 04/22/2024 1204   ALT 27 03/28/2015 0957   ALKPHOS 72 04/22/2024 1204   ALKPHOS 81 03/28/2015 0957   BILITOT 0.4 04/22/2024 1204   GFRNONAA >60 04/22/2024 1204   GFRAA >90 09/05/2011 0402   Lab Results  Component Value Date   WBC 7.0 04/22/2024   NEUTROABS 4.6 04/22/2024   HGB 15.7 04/22/2024   HCT 46.0 04/22/2024   MCV 87.0 04/22/2024   PLT 217 04/22/2024   Imaging:  [facility] Clinician Interpretation: I have personally reviewed the CNS images as listed.  My interpretation, in the context of the patient's clinical presentation, is progressive disease with increasing size of nodular focus left temporal  No results found.  Assessment/Plan Astrocytoma (HCC)  Seizure (HCC)  Nathan Prince is clinically stable today.  MRI brain  demonstrates progressive changes, with left mesial temporal nodule continuing to increase in enhancing volume.  [facility] team is having discussions with neurosurgery about potential biopsy or resection.  Will con't Tibsovo  an additional 6 weeks in the interim per [facility] team recommendation.  Will con't Keppra  (2000mg  BID ER), Vimpat  (200mg  BID) and Onfi  (10mg  daily) at this time.    For astrocytoma, he will continue with Tibsovo  500mg  daily.    Therapy should be held for the following:  QTC greater than  Platelets less than 100,000  LFT or creatinine greater than 2x ULN  If clinical concerns/contraindications develop  May con't zoloft  25mg  daily for depression symptoms.  We discussed his work situation and stressors again today.  We ask that Nathan Prince return to clinic in 2 months with MRI brain for review, or sooner as needed if surgical plan is implemented.    All questions were answered. The patient knows to call the clinic with any problems, questions or concerns. No barriers to learning were detected.  The total time spent in the encounter was 40 minutes and more than 50% was on counseling and review of test results   Arthea MARLA Manns, MD Medical Director of Neuro-Oncology [facility] at [place] 04/22/24 2:11 PM

## 2024-04-23 ENCOUNTER — Other Ambulatory Visit: Payer: Self-pay

## 2024-04-28 ENCOUNTER — Encounter: Payer: Self-pay | Admitting: Internal Medicine

## 2024-05-04 ENCOUNTER — Telehealth: Payer: Self-pay | Admitting: *Deleted

## 2024-05-04 NOTE — Telephone Encounter (Signed)
 PC from patient's wife, [name] - patient is home from [facility] & would like to see Dr Buckley.  He has post-op MRI at [facility], 05/05/24.  Appointment available here on 05/06/24 at 3:00, [name] states that will work for them.  Appointment scheduled.

## 2024-05-05 ENCOUNTER — Other Ambulatory Visit: Payer: Self-pay | Admitting: *Deleted

## 2024-05-05 ENCOUNTER — Encounter: Payer: Self-pay | Admitting: *Deleted

## 2024-05-05 ENCOUNTER — Inpatient Hospital Stay
Admission: RE | Admit: 2024-05-05 | Discharge: 2024-05-05 | Disposition: A | Discharge status: Home / Self Care | Payer: Self-pay | Source: Ambulatory Visit | Attending: Internal Medicine

## 2024-05-05 DIAGNOSIS — C719 Malignant neoplasm of brain, unspecified: Secondary | ICD-10-CM

## 2024-05-05 NOTE — Progress Notes (Signed)
 Requested that [facility] push images from MRI brain done on 05/05/24 to [facility] in time for patient's appointment here tomorrow.   Email to [employer] to link images to outside films order.

## 2024-05-06 ENCOUNTER — Inpatient Hospital Stay (HOSPITAL_BASED_OUTPATIENT_CLINIC_OR_DEPARTMENT_OTHER): Admitting: Internal Medicine

## 2024-05-06 VITALS — BP 139/86 | HR 100 | Temp 97.4°F | Resp 18 | Ht 70.0 in | Wt 230.0 lb

## 2024-05-06 DIAGNOSIS — R569 Unspecified convulsions: Secondary | ICD-10-CM

## 2024-05-06 DIAGNOSIS — R4701 Aphasia: Secondary | ICD-10-CM

## 2024-05-06 DIAGNOSIS — C712 Malignant neoplasm of temporal lobe: Secondary | ICD-10-CM | POA: Diagnosis not present

## 2024-05-06 DIAGNOSIS — C719 Malignant neoplasm of brain, unspecified: Secondary | ICD-10-CM

## 2024-05-06 NOTE — Progress Notes (Signed)
 [facility] at [facility] [address]  [place], [place] [address] [phone]   Interval Evaluation  Date of Service: 05/06/24 Patient Name: Nathan Prince Patient MRN: [id number] Patient DOB: 12-14-1965 Provider: Arthea MARLA Manns, MD  Identifying Statement:  Nathan Prince is a 58 y.o. 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 Pathology: astrocytoma (WHO Grade II) per patient report.  10/02/2011 Surgery Craniotomy for gross total resection performed by Peggye Li, MD. Pathology: anaplastic oligoastrocytoma (WHO Grade III).  - 12/31/2011 Radiation Radiographically stable after conclusion of radiation therapy with concurrent temozolomide  75 mg/m2 daily. Recommended proceeding with five day temozolomide , dosed at 150 mg/m2 for the first cycle and escalated to 200 mg/m2 for subsequent cycles if well tolerated.  01/01/2012 - 10/27/2012 Chemotherapy Eleven cycles of five day temozolomide . Hypometabolic PET scan. Recommended discontinuing temozolomide . Serial MRI monitoring initiated.  02/27/2021 Clinical Event-Other MRI on 02/27/21 reveals new very small area of enhancement medial to resection cavity best seen on axials, concerning for possible disease progression; note made of new left frontal enhancing lesion, which was not present on the 04/11/20 scan.  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 Peggye Li for resection of tumor.  03/27/2021 Radiology Study MRI brain 4 week follow up scan shows growth of the left frontal lesion. Increased left frontal convexity enhancing dural based lesion measuring 1.9 cm AP by 1.0 cm TV by 2.0 cm CC (1.4 x 0.7 x 1.8 cm on 02/27/2021 exam). Recommend resection with Dr. Li.  07/31/2021 Radiology Study MRI post radiation for left frontal meningioma and for left temporal oligoastrocytoma surveillance. These two sites are stable. Small enhancing focus medial to left temporal resection cavity is apparent on today's scan comparable to July 2022. Will follow up with a short term MRI in another 6 weeks.  Atypical meningioma of brain (CMS-HCC) 04/20/2021 Initial Diagnosis Atypical meningioma of brain (CMS-HCC)  04/20/2021 Surgery Resection of left frontal convexity mass with Peggye Li. Pathology: atypical meningioma, WHO grade II. TERT negative.  05/31/2021 - 07/16/2021 Radiation IMRT to the left frontal convexity resected typical meningioma total dose 57.6 Gy  07/31/2021 Radiology Study MRI post radiation for left frontal meningioma and for left temporal oligoastrocytoma surveillance. These two sites are stable. Small enhancing focus medial to left temporal resection cavity is apparent on today's scan comparable to July 2022. Will follow up with a short term MRI in another 6 weeks.  11/2022 - Chemotherapy  Tibsovo    04/29/24: Nodular progression left temporal; undergoes craniotomy, resection with Dr. Li.  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     Biomarkers:  MGMT Unknown.  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  EGFR Unknown  TERT Unknown   Interval History: Nathan Prince presents today for follow up following recent admission at [facility] for craniotomy with Dr. Li.  He did well with surgery aside from 1-2 days of dysphasia post-op.  No breakthrough  seizures at all since the surgery.  Remains on Onfi  10mg  daily in additiona to Vimpat  200mg  BID, Keppra  2000mg  BID and Depakote  1000mg  BID.  He is now officially retired.  Depression and mood swings continue to be a problem at times.  H+P (11/04/22) Patient presents today to establish care for local treatment of progressive grade 3 astrocytoma, IDHmt.  He and his wife describe decline in function, energy, cognition, language expression since his hospitalization in January for status epilepticus.  He is still teaching full time, and is having difficulty in class with communication and overall energy.  Currently dosing Keppra  2000mg  BID, Vimpat  200mg  BID, and Depakote  1000mg  BID.  Not on any steroids.  He will be proceeding with Tibsovo  therapy per [facility] team recommendations.  Medications: Current Outpatient Medications on File Prior to Visit  Medication Sig Dispense Refill   Apoaequorin (PREVAGEN PO) Take 1 tablet by mouth daily.     Calcium Carb-Cholecalciferol (CALCIUM 600/VITAMIN D3 PO) Take 1 tablet by mouth 2 (two) times daily.     cloBAZam  (ONFI ) 10 MG tablet Take 1 tablet (10 mg total) by mouth at bedtime. 30 tablet 1   clonazePAM (KLONOPIN) 1 MG disintegrating tablet Take 1 mg by mouth daily as needed.     diazePAM , 20 MG Dose, (VALTOCO  20 MG DOSE) 2 x 10 MG/0.1ML LQPK Place 1 spray into both nostrils daily as needed for up to 5 doses (for seizure lasting over 5 minutes). 2 each 3   divalproex  (DEPAKOTE ) 500 MG DR tablet Take 2 tablets (1,000 mg total) by mouth 2 (two) times daily. MUST MAKE AND KEEP APPOINTMENT FOR FURTHER REFILLS 120 tablet 0   ivosidenib  (TIBSOVO ) 250 MG tablet Take 2 tablets (500 mg total) by mouth daily. Avoid taking with a high-fat meal. 60 tablet 1   lacosamide  (VIMPAT ) 200 MG TABS tablet TAKE 1 TABLET BY MOUTH TWICE A DAY 60 tablet 3   levETIRAcetam  1000 MG TB24 Take 2,000 mg by mouth 2 (two) times daily. 112 tablet 2   loratadine (CLARITIN) 10 MG tablet Take 10 mg by  mouth daily as needed for allergies.     Multiple Vitamins-Minerals (MULTIVITAMINS THER. W/MINERALS) TABS Take 1 tablet by mouth daily.      prochlorperazine  (COMPAZINE ) 10 MG tablet Take 1 tablet (10 mg total) by mouth every 6 (six) hours as needed for nausea or vomiting. 30 tablet 2   pyridoxine (B-6) 100 MG tablet Take by mouth.     sertraline  (ZOLOFT ) 25 MG tablet TAKE 1 TABLET (25 MG TOTAL) BY MOUTH DAILY. 90 tablet 0   Current Facility-Administered Medications on File Prior to Visit  Medication Dose Route Frequency Provider Last Rate Last Admin   topical emolient (BIAFINE) emulsion   Topical PRN Patrcia Cough, MD   Given at 11/15/11 1401    Allergies: No Known Allergies Past Medical History:  Past Medical History:  Diagnosis Date   Astrocytoma (HCC) 09/17/2011   subsequent oligodendroglioma in 2022   Depression    Low testosterone  03/30/2013   Radiation 11/04/11-12/19/11   Left temporal brain 59.4 gray in 33 fractions   Skin cancer 09/09/1986   Past Surgical History:  Past Surgical History:  Procedure Laterality Date   BACK SURGERY  09/09/1994   ruptured disc   CRANIOTOMY  09/04/2011   Procedure: CRANIOTOMY TUMOR EXCISION;  Surgeon: Catalina CHRISTELLA Stains;  Location: [facility];  Service: Neurosurgery;  Laterality: Left;  Left Temporal craniectomy for tumor   MASS BIOPSY  09/05/2011  left temporal lobe   skin cancer removal  1988   Removed skin cancer to left chest   Social History:  Social History   Socioeconomic History   Marital status: Married    Spouse name: Not on file   Number of children: Not on file   Years of education: Not on file   Highest education level: Not on file  Occupational History   Occupation: history teacher at [employer]  Tobacco Use   Smoking status: Never   Smokeless tobacco: Never   Tobacco comments:    never used tobacco  Vaping Use   Vaping status: Never Used  Substance and Sexual Activity   Alcohol use: No    Alcohol/week:  0.0 standard drinks of alcohol   Drug use: No   Sexual activity: Not on file  Other Topics Concern   Not on file  Social History Narrative   Not on file   Social Drivers of Health   Financial Resource Strain: Low Risk  (04/30/2024)   Received from [facility]   Overall Financial Resource Strain (CARDIA)    Difficulty of Paying Living Expenses: Not hard at all  Food Insecurity: No Food Insecurity (04/30/2024)   [facility] from [facility]   Hunger Vital Sign    Within the past 12 months, you worried that your food would run out before you got the money to buy more.: Never true    Within the past 12 months, the food you bought just didn't last and you didn't have money to get more.: Never true  Transportation Needs: Unknown (04/30/2024)   Received from [facility] - Transportation    In the past 12 months, has lack of transportation kept you from medical appointments or from getting medications?: No    Lack of Transportation (Non-Medical): Not on file  Physical Activity: Not on file  Stress: Not on file  Social Connections: Not on file  Intimate Partner Violence: Not At Risk (09/24/2022)   Humiliation, Afraid, Rape, and Kick questionnaire    Fear of Current or Ex-Partner: No    Emotionally Abused: No    Physically Abused: No    Sexually Abused: No   Family History: No family history on file.  Review of Systems: Constitutional: Doesn't report fevers, chills or abnormal weight loss Eyes: Doesn't report blurriness of vision Ears, nose, mouth, throat, and face: Doesn't report sore throat Respiratory: Doesn't report cough, dyspnea or wheezes Cardiovascular: Doesn't report palpitation, chest discomfort  Gastrointestinal:  Doesn't report nausea, constipation, diarrhea GU: Doesn't report incontinence Skin: Doesn't report skin rashes Neurological: Per HPI Musculoskeletal: Doesn't report joint pain Behavioral/Psych: Doesn't  report anxiety  Physical Exam: Vitals:   05/06/24 1446  BP: 139/86  Pulse: 100  Resp: 18  Temp: (!) 97.4 F (36.3 C)  SpO2: 97%    KPS: 80. General: Alert, cooperative, pleasant, in no acute distress Head: Craniotomy scar EENT: No conjunctival injection or scleral icterus.  Lungs: Resp effort normal Cardiac: Regular rate Abdomen: Non-distended abdomen Skin: No rashes cyanosis or petechiae. Extremities: No clubbing or edema  Neurologic Exam: Mental Status: Awake, alert, attentive to examiner. Oriented to self and environment. Language is modestly impaired with regards to fluency.  Age advanced pyschomotor slowing Cranial Nerves: Visual acuity is grossly normal. Visual fields are full. Extra-ocular movements intact. No ptosis. Face is symmetric Motor: Tone and bulk are normal. Power is full in both arms and legs. Reflexes are symmetric,  no pathologic reflexes present.  Sensory: Intact to light touch Gait: Normal.   Labs: I have reviewed the data as listed    Component Value Date/Time   NA 141 04/22/2024 1204   NA 141 10/28/2022 0946   NA 140 03/28/2015 0957   K 4.3 04/22/2024 1204   K 3.9 03/28/2015 0957   CL 106 04/22/2024 1204   CL 104 03/28/2015 0957   CO2 30 04/22/2024 1204   CO2 26 03/28/2015 0957   GLUCOSE 101 (H) 04/22/2024 1204   GLUCOSE 124 (H) 03/28/2015 0957   BUN 18 04/22/2024 1204   BUN 16 10/28/2022 0946   BUN 14 03/28/2015 0957   CREATININE 0.96 04/22/2024 1204   CREATININE 1.1 03/28/2015 0957   CALCIUM 9.6 04/22/2024 1204   CALCIUM 9.0 03/28/2015 0957   PROT 6.8 04/22/2024 1204   PROT 6.9 10/28/2022 0946   PROT 7.2 03/28/2015 0957   ALBUMIN 4.5 04/22/2024 1204   ALBUMIN 4.5 10/28/2022 0946   AST 16 04/22/2024 1204   ALT 16 04/22/2024 1204   ALT 27 03/28/2015 0957   ALKPHOS 72 04/22/2024 1204   ALKPHOS 81 03/28/2015 0957   BILITOT 0.4 04/22/2024 1204   GFRNONAA >60 04/22/2024 1204   GFRAA >90 09/05/2011 0402   Lab Results  Component  Value Date   WBC 7.0 04/22/2024   NEUTROABS 4.6 04/22/2024   HGB 15.7 04/22/2024   HCT 46.0 04/22/2024   MCV 87.0 04/22/2024   PLT 217 04/22/2024   Imaging:  [facility] Clinician Interpretation: I have personally reviewed the CNS images as listed.  My interpretation, in the context of the patient's clinical presentation, is progressive disease with increasing size of nodular focus left temporal  No results found.  Assessment/Plan No diagnosis found.  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  Post-op MRI is still pending for review of images.    Per [facility] team, recommendation will be the following: -Post op SRS with Dr. Michaela -Adjuvant oral CCNU 90-110mg /m2, plan for 2 cycles then re-image -TBD regarding resumption of Tibsovo  -Referral to speech therapy given expressive language deficits  Will con't Keppra  (2000mg  BID ER), Vimpat  (200mg  BID) and Onfi  (10mg  daily) at this time.    May con't zoloft  25mg  daily for depression symptoms.    We ask that Nathan Prince return to clinic TBD for initiation of CCNU, or sooner as needed if surgical plan is implemented.    All questions were answered. The patient knows to call the clinic with any problems, questions or concerns. No barriers to learning were detected.  The total time spent in the encounter was 40 minutes and more than 50% was on counseling and review of test results   Arthea MARLA Manns, MD Medical Director of Neuro-Oncology [facility] 05/06/24 3:06 PM

## 2024-05-07 ENCOUNTER — Telehealth: Payer: Self-pay | Admitting: *Deleted

## 2024-05-07 NOTE — Telephone Encounter (Signed)
 Referral for [facility] speech therapy, patient demographic sheet & office notes from 05/07/24 faxed to [facility], [phone].  Fax confirmation received.

## 2024-05-13 ENCOUNTER — Other Ambulatory Visit: Payer: Self-pay

## 2024-05-13 ENCOUNTER — Other Ambulatory Visit (HOSPITAL_COMMUNITY): Payer: Self-pay

## 2024-05-13 ENCOUNTER — Other Ambulatory Visit: Payer: Self-pay | Admitting: Internal Medicine

## 2024-05-13 DIAGNOSIS — C719 Malignant neoplasm of brain, unspecified: Secondary | ICD-10-CM

## 2024-05-13 MED ORDER — LOMUSTINE 40 MG PO CAPS
40.0000 mg | ORAL_CAPSULE | Freq: Once | ORAL | 0 refills | Status: AC
Start: 2024-05-24 — End: 2024-05-26
  Filled 2024-05-18: qty 1, 42d supply, fill #0
  Filled 2024-05-18: qty 1, 1d supply, fill #0

## 2024-05-13 MED ORDER — ONDANSETRON HCL 8 MG PO TABS
8.0000 mg | ORAL_TABLET | Freq: Three times a day (TID) | ORAL | 1 refills | Status: AC | PRN
  Filled 2024-05-13: qty 18, 21d supply, fill #0

## 2024-05-13 MED ORDER — LOMUSTINE 100 MG PO CAPS
200.0000 mg | ORAL_CAPSULE | Freq: Once | ORAL | 0 refills | Status: AC
  Filled 2024-05-18: qty 2, 42d supply, fill #0
  Filled 2024-05-18: qty 2, 1d supply, fill #0

## 2024-05-13 MED ORDER — LOMUSTINE 10 MG PO CAPS
10.0000 mg | ORAL_CAPSULE | Freq: Once | ORAL | 0 refills | Status: DC
Start: 2024-05-24 — End: 2024-05-18
  Filled 2024-05-18: qty 1, 1d supply, fill #0

## 2024-05-13 NOTE — Progress Notes (Signed)
 START ON PATHWAY REGIMEN - Neuro     A cycle is every 42 days:     Lomustine    **Always confirm dose/schedule in your pharmacy ordering system**  Patient Characteristics: Glioma, Grade 3 or 4 Astrocytoma, IDH-mutant, Recurrent or Progressive, Nonsurgical Candidate, Systemic Therapy Candidate, BRAF V600E Mutation Negative/Unknown and NTRK Fusion Negative/Unknown Disease Classification: Glioma Disease Classification: Grade 3 or 4 Astrocytoma, IDH-mutant Disease Status: Recurrent or Progressive Treatment Classification: Nonsurgical Candidate Treatment (Nonsurgical/Adjuvant): Systemic Therapy Candidate NTRK Gene Fusion Status: Negative BRAF V600E Mutation Status: Negative Intent of Therapy: Non-Curative / Palliative Intent, Discussed with Patient

## 2024-05-14 ENCOUNTER — Telehealth (HOSPITAL_COMMUNITY): Payer: Self-pay

## 2024-05-14 ENCOUNTER — Other Ambulatory Visit (HOSPITAL_COMMUNITY): Payer: Self-pay

## 2024-05-14 ENCOUNTER — Other Ambulatory Visit: Payer: Self-pay

## 2024-05-14 ENCOUNTER — Telehealth: Payer: Self-pay

## 2024-05-14 NOTE — Telephone Encounter (Signed)
 Oral Oncology Patient Advocate Encounter  After completing a benefits investigation, prior authorization for lomustine  (GLEOSTINE )  is not required at this time through [employer] [facility].  Patient's copay is $0.00 for all 3 strengths.     Lucie Lamer, CPhT [place]  [facility] Pharmacy Technician Patient Advocate Specialist II [place] Phone: [phone]  Fax: [phone] [email or web address]

## 2024-05-14 NOTE — Telephone Encounter (Signed)
 PGY2 Oncology Pharmacy Resident Encounter - Oral Chemo Assessment  Received new prescription for Gleostine  (lomustine ) for the treatment of Grade 4 G[place], planned for 2 cycles then imaging to determine duration.   Oral Chemo Regimen  Medication/dosing schedule: Gleostine  (lomustine ) 110 mg/m^2 (250 mg) x 1 every 6 weeks for 2 cycles 10 mg (1 capsule) + 40 mg (1 capsule) + 200 mg (2x 100 mg capsules)  REMs program: No  Prescription dose and frequency assessed and is appropriate: Yes  Planned start date: 05/24/2024  Labs/Baseline Monitoring   Required baseline labs: CBC, CMP  Labs from 05/03/2024 from [facility] assessed, relevant labs WNL.  Renal/hepatic function: WNL Leukocytes/platelets: WNL Plan: No modifications required at this time  Current Medications  Current medication list reviewed in Epic No relevant DDIs associated with Gleostine  (lomustine ) identified  Supportive Care Needs Yes: Nausea/Vomiting; moderate to high level risk  Ondansetron  8 mg every 8 hours PRN, rx sent 05/24/2024 to [facility] pharmacy No: TLS Not applicable to this regimen    Infection/VTE ppx Needs No: HSV ppx Not applicable to this regimen  No: VTE ppx:  Not applicable to this regimen   Logistics and Coordination  Evaluated chart and no patient barriers to medication adherence identified.  Prescription has been e-scribed to the [facility] for benefits analysis and approval. Oral Oncology Clinic will continue to follow for insurance authorization, copayment issues, initial counseling and start date.  Thank you for allowing pharmacy to participate in this patient's care.  Alfonso MARLA Buys, PharmD Pharmacy Resident  05/14/2024 8:34 AM

## 2024-05-15 ENCOUNTER — Other Ambulatory Visit: Payer: Self-pay

## 2024-05-17 ENCOUNTER — Encounter: Payer: Self-pay | Admitting: Internal Medicine

## 2024-05-17 ENCOUNTER — Encounter: Payer: Self-pay | Admitting: *Deleted

## 2024-05-17 ENCOUNTER — Other Ambulatory Visit: Payer: Self-pay

## 2024-05-17 NOTE — Progress Notes (Signed)
 Routed request to pharmacy to process lomustine  per Dr Eward request.

## 2024-05-18 ENCOUNTER — Other Ambulatory Visit: Payer: Self-pay

## 2024-05-18 ENCOUNTER — Other Ambulatory Visit (HOSPITAL_COMMUNITY): Payer: Self-pay

## 2024-05-18 ENCOUNTER — Telehealth: Payer: Self-pay | Admitting: *Deleted

## 2024-05-18 ENCOUNTER — Telehealth: Payer: Self-pay | Admitting: Pharmacist

## 2024-05-18 NOTE — Telephone Encounter (Addendum)
 [facility]       Telephone: [phone]?Fax: [phone]   Oncology Clinical Pharmacist Practitioner Initial Assessment  Nathan Prince is a 58 y.o. male with a diagnosis of left temporal anaplastic astrocytoma. They were contacted today via telephone visit.  I connected with Nathan Prince today by telephone and verified that I was speaking with the correct person using two patient identifiers. I discussed the limitations, risks, security and privacy concerns of performing an evaluation and management service by telemedicine and the availability of in-person appointments. The patient/caregiver expressed understanding and agreed to proceed.  Other persons participating in the visit and their role in the encounter: wife   Patient's location: home  Provider's location: clinic  Indication/Regimen Lomustine  (Gleostine ) is being used appropriately for treatment of left temporal anaplastic astrocytoma  by Dr. Arthea Manns.      Wt Readings from Last 1 Encounters:  05/06/24 230 lb (104.3 kg)    Estimated body surface area is 2.27 meters squared as calculated from the following:   Height as of 05/06/24: 5' 10 (1.778 m).   Weight as of 05/06/24: 230 lb (104.3 kg).  The dosing regimen is 240 mg by mouth once on day 1 of a 42-day cycle. This is being given  monotherapy. It is planned to continue until treatment plan completion or unacceptable toxicity. Prescription dose and frequency assessed for appropriateness. The treatment goal is: Control.  Patient has agreed to treatment which is documented in physician note on 05/06/24. Counseled patient on administration, dosing, side effects, monitoring, drug-food interactions, safe handling, storage, and disposal.  Patient will pick up from [facility] on 05/20/24 but has been instructed not to take lomustine  until after radiation has finished from [facility] per Dr. Manns instructions. Dr. Manns will then order labs every 2 weeks to monitor  for lomustine  side effects. Reviewed that medication will come in 2 different strengths and it is to be taken once.  Dose Modifications None   Access Assessment Nathan Prince will be receiving lomustine  through [facility] Concerns: none Start date if known: TBD  Adherence Assessment Reviewed importance on keeping a med schedule and plan for any missed doses Barriers to adherence identified? No  Communication and Learning Assessment Primary learner: patient / wife Barriers to learning: No barriers Preferred language: English Learning preferences: Listening   Allergies No Known Allergies  Vitals    05/06/2024    2:46 PM 04/22/2024   12:39 PM 04/22/2024   12:38 PM  Oncology Vitals  Height 178 cm    Weight 104.327 kg  104.055 kg  Weight (lbs) 230 lbs  229 lbs 6 oz  BMI 33 kg/m2  32.92 kg/m2  Temp 97.4 F (36.3 C)  98 F (36.7 C)  Pulse Rate 100  78  BP 139/86 137/88 122/91  Resp 18  20  SpO2 97 %  100 %  BSA (m2) 2.27 m2  2.27 m2     Laboratory Data    Latest Ref Rng & Units 04/22/2024   12:04 PM 03/02/2024    8:53 AM 01/13/2024    8:47 AM  CBC EXTENDED  WBC 4.0 - 10.5 K/uL 7.0  6.7  6.8   RBC 4.22 - 5.81 MIL/uL 5.29  5.42  5.45   Hemoglobin 13.0 - 17.0 g/dL 84.2  83.9  83.9   HCT 39.0 - 52.0 % 46.0  47.2  47.2   Platelets 150 - 400 K/uL 217  209  210   NEUT#  1.7 - 7.7 K/uL 4.6  4.3  4.4   Lymph# 0.7 - 4.0 K/uL 1.7  1.7  1.8        Latest Ref Rng & Units 04/22/2024   12:04 PM 03/02/2024    8:53 AM 01/13/2024    8:47 AM  CMP  Glucose 70 - 99 mg/dL 898  88  99   BUN 6 - 20 mg/dL 18  19  16    Creatinine 0.61 - 1.24 mg/dL 9.03  8.99  8.99   Sodium 135 - 145 mmol/L 141  142  141   Potassium 3.5 - 5.1 mmol/L 4.3  3.9  4.5   Chloride 98 - 111 mmol/L 106  108  104   CO2 22 - 32 mmol/L 30  27  31    Calcium 8.9 - 10.3 mg/dL 9.6  9.7  9.7   Total Protein 6.5 - 8.1 g/dL 6.8  6.9  7.0   Total Bilirubin 0.0 - 1.2 mg/dL 0.4  0.5  0.5    Alkaline Phos 38 - 126 U/L 72  68  68   AST 15 - 41 U/L 16  19  19    ALT 0 - 44 U/L 16  20  17     Contraindications Contraindications were reviewed? Yes Contraindications to therapy were identified? No   Safety Precautions The following safety precautions for the use of lomustine  were reviewed:  Fever: reviewed the importance of having a thermometer and the Centers for Disease Control and Prevention (CDC) definition of fever which is 100.41F (38C) or higher. Patient should call 24/7 triage at [phone] if experiencing a fever or any other symptoms Decreased platelet count and increased risk for bleeding Decreased white blood cells (WBCs) and increased risk for infection Nausea or vomiting Lomustine  can be fatal if taken more frequently than prescribed Lomustine  is taken by mouth as a single dose once every 6 weeks. If dose if forgotten, please contact your healthcare provider Lomustine  can be taken with or without food but taking without food may decrease the amount of nausea Secondary cancers - MDS or AML May be harmful to liver May be harmful to kidneys May decrease fertility in males and females Missed doses Handing body fluids and waste Pregnancy, sexual activity, and contraception - pregnant women should avoid touching anything that may be soiled with body fluids from the patient Do not breastfeed Storage and handling  Medication Reconciliation Current Outpatient Medications  Medication Sig Dispense Refill   Calcium Carb-Cholecalciferol (CALCIUM 600/VITAMIN D3 PO) Take 1 tablet by mouth 2 (two) times daily.     cloBAZam  (ONFI ) 10 MG tablet Take 1 tablet (10 mg total) by mouth at bedtime. 30 tablet 1   dexamethasone  (DECADRON ) 4 MG tablet Take 1 mg by mouth daily. To finish on 05/08/24 (Patient taking differently: Take 1 mg by mouth daily. 0.5 mg until 05/23/24 per Dr. Michaela ([facility]))     lacosamide  (VIMPAT ) 200 MG TABS tablet TAKE 1 TABLET BY MOUTH TWICE A DAY 60  tablet 3   levETIRAcetam  1000 MG TB24 Take 2,000 mg by mouth 2 (two) times daily. 112 tablet 2   melatonin 3 MG TABS tablet Take 6 mg by mouth at bedtime as needed.     Multiple Vitamins-Minerals (MULTIVITAMINS THER. W/MINERALS) TABS Take 1 tablet by mouth daily.      omeprazole (PRILOSEC) 20 MG capsule Take 20 mg by mouth daily.     pyridoxine (B-6) 100 MG tablet Take by mouth.  sertraline  (ZOLOFT ) 25 MG tablet TAKE 1 TABLET (25 MG TOTAL) BY MOUTH DAILY. 90 tablet 0   traZODone (DESYREL) 50 MG tablet Take 25 mg by mouth at bedtime.     clonazePAM (KLONOPIN) 1 MG disintegrating tablet Take 1 mg by mouth daily as needed. (Patient not taking: Reported on 05/18/2024)     [START ON 05/24/2024] lomustine  (GLEOSTINE ) 10 MG capsule Take 1 capsule (10 mg total) by mouth once for 1 dose. Take on an empty stomach 1 hour before or 2 hours after meals. Caution:chemotherapy. (Patient not taking: Reported on 05/18/2024) 1 capsule 0   [START ON 05/24/2024] lomustine  (GLEOSTINE ) 100 MG capsule Take 2 capsules (200 mg total) by mouth once for 1 dose. Take on an empty stomach 1 hour before or 2 hours after meals. Caution:chemotherapy (Patient not taking: Reported on 05/18/2024) 2 capsule 0   [START ON 05/24/2024] lomustine  (GLEOSTINE ) 40 MG capsule Take 1 capsule (40 mg total) by mouth once for 1 dose. Take on an empty stomach 1 hour before or 2 hours after meals. Caution:chemotherapy. (Patient not taking: Reported on 05/18/2024) 1 capsule 0   [START ON 05/24/2024] ondansetron  (ZOFRAN ) 8 MG tablet Take 1 tablet (8 mg total) by mouth every 8 (eight) hours as needed for nausea or vomiting. (Patient not taking: Reported on 05/18/2024) 30 tablet 1   No current facility-administered medications for this visit.   Facility-Administered Medications Ordered in Other Visits  Medication Dose Route Frequency Provider Last Rate Last Admin   topical emolient (BIAFINE) emulsion   Topical PRN Patrcia Cough, MD   Given at 11/15/11 1401     Medication reconciliation is based on the patient's most recent medication list in the electronic medical record (EMR) including herbal products and OTC medications.   The patient's medication list was reviewed today with the patient? Yes   Drug-drug interactions (DDIs) DDIs were evaluated? Yes Significant DDIs identified? Went over possible side effect of serotonin syndrome with trazodone and sertraline   Drug-Food Interactions Drug-food interactions were evaluated? Yes Drug-food interactions identified? No   Follow-up Plan  Patient education handout given to patient Start lomustine  240 mg by mouth ONCE. Administration date: TBD once finished with radiation at [facility] Dr. Buckley will then order labs and visits every two weeks Monitor for side effects Torey Reinard can follow up with clinical pharmacy as deemed necessary by Dr. Arthea Buckley going forward   Nathan Prince participated in the discussion, expressed understanding, and voiced agreement with the above plan. All questions were answered to their satisfaction. The patient was advised to contact the clinic at [phone] with any questions or concerns prior to their return visit.   I spent 30 minutes assessing the patient.  Kita Neace A. Lucila, PharmD, BCOP, CPP  Norleen DELENA Lucila, RPH-CPP, 05/18/2024 2:44 PM  **Disclaimer: This note was dictated with voice recognition software. Similar sounding words can inadvertently be transcribed and this note may contain transcription errors which may not have been corrected upon publication of note.**

## 2024-05-18 NOTE — Telephone Encounter (Signed)
 PC to patient's wife Nathan Prince to determine upcoming plan of care at [facility] in order to coordinate appointments here.   Nathan Prince states patient begins radiation at [facility] next week but they don't know entire schedule at this time.  Per Dr Buckley, gleostine  will be dosed after final RT.  Once patient has last RT & begins his gleostine , Dr Buckley wants to see patient for labs & MD visit every two weeks.    F/U call to Nathan Prince, no answer, left VM - requested that she contact us  once she knows the final RT date & we will make our appointments accordingly.

## 2024-05-18 NOTE — Progress Notes (Signed)
 Specialty Pharmacy Initial Fill Coordination Note  Nathan Prince is a 58 y.o. male contacted today regarding refills of specialty medication(s) Lomustine  (GLEOSTINE ) .  Patient requested [name] at [facility] at [place]  on 05/20/24   Medication will be filled on 05/19/24.   Patient is aware of $0.00 copayment.    Lucie Lamer, CPhT [place]  [facility] Pharmacy Technician Patient Advocate Specialist II THERESSA [place] Phone: [phone]  Fax: [phone] [email or web address]

## 2024-05-18 NOTE — Progress Notes (Signed)
 Patient counseled in clinic telephone visit note on 05/18/24

## 2024-05-18 NOTE — Telephone Encounter (Signed)
 Pharmacist Silvano Dolly notified Dr. Buckley and clinical pharmacy that 10 mg capsules of lomustine  are  unavailable. Dr. Buckley is okay with patient receiving 240 mg instead of initial 250 mg. Contacted patient to make them aware.

## 2024-05-19 ENCOUNTER — Other Ambulatory Visit: Payer: Self-pay

## 2024-05-19 ENCOUNTER — Telehealth: Payer: Self-pay | Admitting: Internal Medicine

## 2024-05-19 ENCOUNTER — Telehealth: Payer: Self-pay | Admitting: *Deleted

## 2024-05-19 NOTE — Telephone Encounter (Signed)
 Physician order for speech therapy signed by Dr Buckley & faxed to [facility], [phone].  Fax confirmation received.

## 2024-05-19 NOTE — Telephone Encounter (Signed)
 Scheduled appointments per staff message. Talked with the patient and his spouse and they are aware of the made appointments.

## 2024-05-20 ENCOUNTER — Telehealth: Payer: Self-pay | Admitting: *Deleted

## 2024-05-20 ENCOUNTER — Other Ambulatory Visit: Payer: Self-pay | Admitting: Internal Medicine

## 2024-05-20 NOTE — Telephone Encounter (Signed)
 HH Certification & Plan of care signed by Dr Buckley & returned by fax to [facility], [phone].  Fax confirmation received.

## 2024-05-24 ENCOUNTER — Other Ambulatory Visit (HOSPITAL_COMMUNITY): Payer: Self-pay

## 2024-05-25 ENCOUNTER — Encounter: Payer: Self-pay | Admitting: Internal Medicine

## 2024-05-25 ENCOUNTER — Other Ambulatory Visit (HOSPITAL_COMMUNITY): Payer: Self-pay

## 2024-05-27 ENCOUNTER — Telehealth: Payer: Self-pay | Admitting: Pharmacist

## 2024-05-27 NOTE — Telephone Encounter (Signed)
 Oral Oncology Pharmacist Encounter  Received call back from patient and patient's wife, [name]. They are aware that patient will start CCNU on 06/05/24. No other questions or concerns at this time.   Nathan Prince, PharmD, BCPS, BCOP Hematology/Oncology Clinical Pharmacist [phone] 05/27/2024 1:58 PM

## 2024-05-27 NOTE — Telephone Encounter (Signed)
 Oral Oncology Pharmacist Encounter  Attempted to call both patient and patient's wife to provide update on CCNU start date (06/05/24). Left voicemail for call back to discuss CCNU start date.   Nathan Prince, PharmD, BCPS, BCOP Hematology/Oncology Clinical Pharmacist [phone] 05/27/2024 1:08 PM

## 2024-06-07 ENCOUNTER — Inpatient Hospital Stay

## 2024-06-07 ENCOUNTER — Inpatient Hospital Stay: Admitting: Internal Medicine

## 2024-06-08 ENCOUNTER — Encounter: Payer: Self-pay | Admitting: Radiology

## 2024-06-08 ENCOUNTER — Other Ambulatory Visit: Payer: Self-pay

## 2024-06-08 ENCOUNTER — Telehealth: Payer: Self-pay | Admitting: *Deleted

## 2024-06-08 NOTE — Telephone Encounter (Signed)
 Physician order received from [facility] for this patient, signed by Dr Buckley.  Returned fax to [phone], fax confirmation received.

## 2024-06-17 ENCOUNTER — Other Ambulatory Visit: Payer: Self-pay

## 2024-06-21 ENCOUNTER — Inpatient Hospital Stay: Attending: Internal Medicine

## 2024-06-21 ENCOUNTER — Inpatient Hospital Stay: Admitting: Internal Medicine

## 2024-06-21 VITALS — BP 121/73 | HR 76 | Temp 97.7°F | Resp 17 | Ht 70.0 in | Wt 230.0 lb

## 2024-06-21 DIAGNOSIS — Z7952 Long term (current) use of systemic steroids: Secondary | ICD-10-CM | POA: Insufficient documentation

## 2024-06-21 DIAGNOSIS — C719 Malignant neoplasm of brain, unspecified: Secondary | ICD-10-CM

## 2024-06-21 DIAGNOSIS — Z79899 Other long term (current) drug therapy: Secondary | ICD-10-CM | POA: Diagnosis not present

## 2024-06-21 DIAGNOSIS — C712 Malignant neoplasm of temporal lobe: Secondary | ICD-10-CM | POA: Diagnosis present

## 2024-06-21 DIAGNOSIS — Z85828 Personal history of other malignant neoplasm of skin: Secondary | ICD-10-CM | POA: Insufficient documentation

## 2024-06-21 DIAGNOSIS — Z9221 Personal history of antineoplastic chemotherapy: Secondary | ICD-10-CM | POA: Insufficient documentation

## 2024-06-21 DIAGNOSIS — Z923 Personal history of irradiation: Secondary | ICD-10-CM | POA: Diagnosis not present

## 2024-06-21 DIAGNOSIS — R569 Unspecified convulsions: Secondary | ICD-10-CM

## 2024-06-21 DIAGNOSIS — Z79630 Long term (current) use of alkylating agent: Secondary | ICD-10-CM | POA: Diagnosis not present

## 2024-06-21 DIAGNOSIS — D696 Thrombocytopenia, unspecified: Secondary | ICD-10-CM | POA: Insufficient documentation

## 2024-06-21 DIAGNOSIS — R112 Nausea with vomiting, unspecified: Secondary | ICD-10-CM | POA: Insufficient documentation

## 2024-06-21 LAB — CBC WITH DIFFERENTIAL (CANCER CENTER ONLY)
Abs Immature Granulocytes: 0.01 K/uL (ref 0.00–0.07)
Basophils Absolute: 0 K/uL (ref 0.0–0.1)
Basophils Relative: 0 %
Eosinophils Absolute: 0 K/uL (ref 0.0–0.5)
Eosinophils Relative: 1 %
HCT: 43.5 % (ref 39.0–52.0)
Hemoglobin: 14.6 g/dL (ref 13.0–17.0)
Immature Granulocytes: 0 %
Lymphocytes Relative: 15 %
Lymphs Abs: 1 K/uL (ref 0.7–4.0)
MCH: 29.1 pg (ref 26.0–34.0)
MCHC: 33.6 g/dL (ref 30.0–36.0)
MCV: 86.7 fL (ref 80.0–100.0)
Monocytes Absolute: 0.4 K/uL (ref 0.1–1.0)
Monocytes Relative: 5 %
Neutro Abs: 5.3 K/uL (ref 1.7–7.7)
Neutrophils Relative %: 79 %
Platelet Count: 105 K/uL — ABNORMAL LOW (ref 150–400)
RBC: 5.02 MIL/uL (ref 4.22–5.81)
RDW: 13 % (ref 11.5–15.5)
Smear Review: NORMAL
WBC Count: 6.7 K/uL (ref 4.0–10.5)
nRBC: 0 % (ref 0.0–0.2)

## 2024-06-21 LAB — CMP (CANCER CENTER ONLY)
ALT: 23 U/L (ref 0–44)
AST: 16 U/L (ref 15–41)
Albumin: 4.4 g/dL (ref 3.5–5.0)
Alkaline Phosphatase: 75 U/L (ref 38–126)
Anion gap: 7 (ref 5–15)
BUN: 18 mg/dL (ref 6–20)
CO2: 27 mmol/L (ref 22–32)
Calcium: 10 mg/dL (ref 8.9–10.3)
Chloride: 108 mmol/L (ref 98–111)
Creatinine: 1.03 mg/dL (ref 0.61–1.24)
GFR, Estimated: >60 mL/min (ref >60)
Glucose, Bld: 119 mg/dL — ABNORMAL HIGH (ref 70–99)
Potassium: 4.1 mmol/L (ref 3.5–5.1)
Sodium: 142 mmol/L (ref 135–145)
Total Bilirubin: 0.4 mg/dL (ref 0.0–1.2)
Total Protein: 6.9 g/dL (ref 6.5–8.1)

## 2024-06-21 MED ORDER — DEXAMETHASONE 4 MG PO TABS: 4.0000 mg | ORAL_TABLET | Freq: Every day | ORAL | 1 refills | Status: DC

## 2024-06-21 NOTE — Progress Notes (Signed)
 [facility] at [facility] [address]  [place], [place] [address] [phone]   Interval Evaluation  Date of Service: 06/21/24 Patient Name: [name] Patient MRN: [id number] Patient DOB: 17-Jul-1966 Provider: Arthea MARLA Manns, MD  Identifying Statement:  Nathan Prince is a 58 y.o. 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 Pathology: astrocytoma (WHO Grade II) per patient report.  10/02/2011 Surgery Craniotomy for gross total resection performed by Peggye Li, MD. Pathology: anaplastic oligoastrocytoma (WHO Grade III).  - 12/31/2011 Radiation Radiographically stable after conclusion of radiation therapy with concurrent temozolomide  75 mg/m2 daily. Recommended proceeding with five day temozolomide , dosed at 150 mg/m2 for the first cycle and escalated to 200 mg/m2 for subsequent cycles if well tolerated.  01/01/2012 - 10/27/2012 Chemotherapy Eleven cycles of five day temozolomide . Hypometabolic PET scan. Recommended discontinuing temozolomide . Serial MRI monitoring initiated.  02/27/2021 Clinical Event-Other MRI on 02/27/21 reveals new very small area of enhancement medial to resection cavity best seen on axials, concerning for possible disease progression; note made of new left frontal enhancing lesion, which was not present on the 04/11/20 scan.  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 Peggye Li for resection of tumor.  03/27/2021 Radiology Study MRI brain 4 week follow up scan shows growth of the left frontal lesion. Increased left frontal convexity enhancing dural based lesion measuring 1.9 cm AP by 1.0 cm TV by 2.0 cm CC (1.4 x 0.7 x 1.8 cm on 02/27/2021 exam). Recommend resection with Dr. Li.  07/31/2021 Radiology Study MRI post radiation for left frontal meningioma and for left temporal oligoastrocytoma surveillance. These two sites are stable. Small enhancing focus medial to left temporal resection cavity is apparent on today's scan comparable to July 2022. Will follow up with a short term MRI in another 6 weeks.  Atypical meningioma of brain (CMS-HCC) 04/20/2021 Initial Diagnosis Atypical meningioma of brain (CMS-HCC)  04/20/2021 Surgery Resection of left frontal convexity mass with Peggye Li. Pathology: atypical meningioma, WHO grade II. TERT negative.  05/31/2021 - 07/16/2021 Radiation IMRT to the left frontal convexity resected typical meningioma total dose 57.6 Gy  07/31/2021 Radiology Study MRI post radiation for left frontal meningioma and for left temporal oligoastrocytoma surveillance. These two sites are stable. Small enhancing focus medial to left temporal resection cavity is apparent on today's scan comparable to July 2022. Will follow up with a short term MRI in another 6 weeks.  11/2022 - Chemotherapy  Tibsovo    04/29/24: Nodular progression left temporal; undergoes craniotomy, resection with Dr. Li.  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    06/04/24: Completes 25Gy/5 to resection site with Dr. Michaela  06/05/24: Doses cycle #1 Lomustine  110mg /m2   Biomarkers:  MGMT Unknown.  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  EGFR Unknown  TERT Unknown   Interval History: Nathan Prince presents today for follow up, now having completed radiation and dosing  first cycle of  CCNU as of 06/05/24.  He did well with radiation, and no significant issues dosing the CCNU.  Today he and his wife do report worsening expressive language, getting words out.  His balance is a little worse as well, having to hold on to objects at times in the home.  No frank breakthrough seizures.  Remains on Onfi  10mg  daily in additiona to Vimpat  200mg  BID, Keppra  2000mg  BID.  He is now officially retired.  Depression and mood swings continue to be a problem at times.  H+P (11/04/22) Patient presents today to establish care for local treatment of progressive grade 3 astrocytoma, IDHmt.  He and his wife describe decline in function, energy, cognition, language expression since his hospitalization in January for status epilepticus.  He is still teaching full time, and is having difficulty in class with communication and overall energy.  Currently dosing Keppra  2000mg  BID, Vimpat  200mg  BID, and Depakote  1000mg  BID.  Not on any steroids.  He will be proceeding with Tibsovo  therapy per [facility] team recommendations.  Medications: Current Outpatient Medications on File Prior to Visit  Medication Sig Dispense Refill   Calcium Carb-Cholecalciferol (CALCIUM 600/VITAMIN D3 PO) Take 1 tablet by mouth 2 (two) times daily.     cloBAZam  (ONFI ) 10 MG tablet Take 1 tablet (10 mg total) by mouth at bedtime. 30 tablet 1   clonazePAM (KLONOPIN) 1 MG disintegrating tablet Take 1 mg by mouth daily as needed. (Patient not taking: Reported on 05/18/2024)     dexamethasone  (DECADRON ) 4 MG tablet Take 1 mg by mouth daily. To finish on 05/08/24 (Patient taking differently: Take 1 mg by mouth daily. 0.5 mg until 05/23/24 per Dr. Michaela ([facility]))     lacosamide  (VIMPAT ) 200 MG TABS tablet TAKE 1 TABLET BY MOUTH TWICE A DAY 60 tablet 3   levETIRAcetam  1000 MG TB24 Take 2,000 mg by mouth 2 (two) times daily. 112 tablet 2   melatonin 3 MG TABS tablet Take 6 mg by mouth at bedtime as needed.     Multiple Vitamins-Minerals  (MULTIVITAMINS THER. W/MINERALS) TABS Take 1 tablet by mouth daily.      omeprazole (PRILOSEC) 20 MG capsule Take 20 mg by mouth daily.     ondansetron  (ZOFRAN ) 8 MG tablet Take 1 tablet (8 mg total) by mouth every 8 (eight) hours as needed for nausea or vomiting. (Patient not taking: Reported on 05/18/2024) 30 tablet 1   pyridoxine (B-6) 100 MG tablet Take by mouth.     sertraline  (ZOLOFT ) 25 MG tablet TAKE 1 TABLET (25 MG TOTAL) BY MOUTH DAILY. 90 tablet 0   traZODone (DESYREL) 50 MG tablet Take 25 mg by mouth at bedtime.     Current Facility-Administered Medications on File Prior to Visit  Medication Dose Route Frequency Provider Last Rate Last Admin   topical emolient (BIAFINE) emulsion   Topical PRN Patrcia Cough, MD   Given at 11/15/11 1401    Allergies: No Known Allergies Past Medical History:  Past Medical History:  Diagnosis Date   Astrocytoma (HCC) 09/17/2011   subsequent oligodendroglioma in 2022   Depression    Low testosterone  03/30/2013   Radiation 11/04/11-12/19/11   Left temporal brain 59.4 gray in 33 fractions   Skin cancer 09/09/1986   Past Surgical History:  Past Surgical History:  Procedure Laterality Date   BACK SURGERY  09/09/1994   ruptured disc   CRANIOTOMY  09/04/2011   Procedure: CRANIOTOMY TUMOR EXCISION;  Surgeon: Catalina CHRISTELLA Stains;  Location: [facility];  Service:  Neurosurgery;  Laterality: Left;  Left Temporal craniectomy for tumor   MASS BIOPSY  09/05/2011   left temporal lobe   skin cancer removal  1988   Removed skin cancer to left chest   Social History:  Social History   Socioeconomic History   Marital status: Married    Spouse name: Not on file   Number of children: Not on file   Years of education: Not on file   Highest education level: Not on file  Occupational History   Occupation: history teacher at [employer]  Tobacco Use   Smoking status: Never   Smokeless tobacco: Never   Tobacco comments:    never used tobacco  Vaping  Use   Vaping status: Never Used  Substance and Sexual Activity   Alcohol use: No    Alcohol/week: 0.0 standard drinks of alcohol   Drug use: No   Sexual activity: Not on file  Other Topics Concern   Not on file  Social History Narrative   Not on file   Social Drivers of Health   Financial Resource Strain: Low Risk  (04/30/2024)   Received from [facility]   Overall Financial Resource Strain (CARDIA)    Difficulty of Paying Living Expenses: Not hard at all  Food Insecurity: No Food Insecurity (04/30/2024)   Received from [facility]   Hunger Vital Sign    Within the past 12 months, you worried that your food would run out before you got the money to buy more.: Never true    Within the past 12 months, the food you bought just didn't last and you didn't have money to get more.: Never true  Transportation Needs: Unknown (04/30/2024)   Received from [facility] - Transportation    In the past 12 months, has lack of transportation kept you from medical appointments or from getting medications?: No    Lack of Transportation (Non-Medical): Not on file  Physical Activity: Not on file  Stress: Not on file  Social Connections: Not on file  Intimate Partner Violence: Not At Risk (09/24/2022)   Humiliation, Afraid, Rape, and Kick questionnaire    Fear of Current or Ex-Partner: No    Emotionally Abused: No    Physically Abused: No    Sexually Abused: No   Family History: No family history on file.  Review of Systems: Constitutional: Doesn't report fevers, chills or abnormal weight loss Eyes: Doesn't report blurriness of vision Ears, nose, mouth, throat, and face: Doesn't report sore throat Respiratory: Doesn't report cough, dyspnea or wheezes Cardiovascular: Doesn't report palpitation, chest discomfort  Gastrointestinal:  Doesn't report nausea, constipation, diarrhea GU: Doesn't report incontinence Skin: Doesn't report skin  rashes Neurological: Per HPI Musculoskeletal: Doesn't report joint pain Behavioral/Psych: Doesn't report anxiety  Physical Exam: Vitals:   06/21/24 1409 06/21/24 1412  BP: (!) 139/106 121/73  Pulse: 76   Resp: 17   Temp: 97.7 F (36.5 C)   SpO2: 100%    KPS: 70. General: Alert, cooperative, pleasant, in no acute distress Head: Craniotomy scar EENT: No conjunctival injection or scleral icterus.  Lungs: Resp effort normal Cardiac: Regular rate Abdomen: Non-distended abdomen Skin: No rashes cyanosis or petechiae. Extremities: No clubbing or edema  Neurologic Exam: Mental Status: Awake, alert, attentive to examiner. Oriented to self and environment. Language is modestly impaired with regards to fluency.  Age advanced pyschomotor slowing Cranial Nerves: Visual acuity is grossly normal. Visual fields are full. Extra-ocular  movements intact. No ptosis. Face is symmetric Motor: Tone and bulk are normal. Power is full in both arms and legs. Reflexes are symmetric, no pathologic reflexes present.  Sensory: Intact to light touch Gait: Dystaxic   Labs: I have reviewed the data as listed    Component Value Date/Time   NA 141 04/22/2024 1204   NA 141 10/28/2022 0946   NA 140 03/28/2015 0957   K 4.3 04/22/2024 1204   K 3.9 03/28/2015 0957   CL 106 04/22/2024 1204   CL 104 03/28/2015 0957   CO2 30 04/22/2024 1204   CO2 26 03/28/2015 0957   GLUCOSE 101 (H) 04/22/2024 1204   GLUCOSE 124 (H) 03/28/2015 0957   BUN 18 04/22/2024 1204   BUN 16 10/28/2022 0946   BUN 14 03/28/2015 0957   CREATININE 0.96 04/22/2024 1204   CREATININE 1.1 03/28/2015 0957   CALCIUM 9.6 04/22/2024 1204   CALCIUM 9.0 03/28/2015 0957   PROT 6.8 04/22/2024 1204   PROT 6.9 10/28/2022 0946   PROT 7.2 03/28/2015 0957   ALBUMIN 4.5 04/22/2024 1204   ALBUMIN 4.5 10/28/2022 0946   AST 16 04/22/2024 1204   ALT 16 04/22/2024 1204   ALT 27 03/28/2015 0957   ALKPHOS 72 04/22/2024 1204   ALKPHOS 81 03/28/2015  0957   BILITOT 0.4 04/22/2024 1204   GFRNONAA >60 04/22/2024 1204   GFRAA >90 09/05/2011 0402   Lab Results  Component Value Date   WBC 7.0 04/22/2024   NEUTROABS 4.6 04/22/2024   HGB 15.7 04/22/2024   HCT 46.0 04/22/2024   MCV 87.0 04/22/2024   PLT 217 04/22/2024     Assessment/Plan Astrocytoma (HCC)  Seizure (HCC)  Elsie Maiden presents today with modest clinical changes localizing generally to left hemisphere, following recent resection and irradiation of now transformed WHO grade 4 glioma.  He is now day 17/42 cycle #1 CCNU.  Labs demonstrate modest thrombocytopenia today.  Symptoms may be secondary to acute or sub-acute inflammation secondary to radiation.  Recommended trial of decadron  4mg  BID x1 day, followed by 4mg  daily.    Patient elected to continue with cycle #1 oral CCNU 110mg /m2 q6 weeks.  We reviewed side effects of CCNU, including fatigue, nausea vomiting, cytopenias, ILD.  We reviewed side effects of avastin, including hypertension, bleeding/clotting events, wound healing impairment.  The patient will have a complete blood count, a comprehensive metabolic panel q2 weeks. Labs may need to be performed more often. Zofran  will prescribed for home use for nausea/vomiting.   Chemotherapy should be held for the following:  ANC less than 1,000  Platelets less than 100,000  LFT or creatinine greater than 2x ULN  If clinical concerns/contraindications develop  Will con't Keppra  (2000mg  BID ER), Vimpat  (200mg  BID) and Onfi  (10mg  daily) at this time.    May con't zoloft  25mg  daily for depression symptoms.    We will give him a call in 1 week to assess response to decadron  and continue titration.  We otherwise ask that Londen Bok return to clinic following repeat labs in 2 weeks.  All questions were answered. The patient knows to call the clinic with any problems, questions or concerns. No barriers to learning were detected.  The total time spent in the  encounter was 30 minutes and more than 50% was on counseling and review of test results   Arthea MARLA Manns, MD Medical Director of Neuro-Oncology [facility] at [facility] 06/21/24 1:39 PM

## 2024-06-22 ENCOUNTER — Other Ambulatory Visit: Payer: Self-pay

## 2024-06-23 ENCOUNTER — Other Ambulatory Visit: Payer: Self-pay

## 2024-06-23 ENCOUNTER — Telehealth: Payer: Self-pay | Admitting: *Deleted

## 2024-06-23 ENCOUNTER — Inpatient Hospital Stay
Admission: RE | Admit: 2024-06-23 | Discharge: 2024-06-23 | Disposition: A | Discharge status: Home / Self Care | Payer: Self-pay | Source: Ambulatory Visit | Attending: Internal Medicine | Admitting: Internal Medicine

## 2024-06-23 ENCOUNTER — Other Ambulatory Visit: Payer: Self-pay | Admitting: *Deleted

## 2024-06-23 DIAGNOSIS — R569 Unspecified convulsions: Secondary | ICD-10-CM

## 2024-06-23 DIAGNOSIS — C719 Malignant neoplasm of brain, unspecified: Secondary | ICD-10-CM

## 2024-06-23 NOTE — Telephone Encounter (Signed)
 Fax sent to [facility] requesting MRI be pushed to [facility].  Email sent to link images.

## 2024-06-23 NOTE — Progress Notes (Signed)
 Specialty Pharmacy Ongoing Clinical Assessment Note  Nathan Prince is a 58 y.o. male who is being followed by the specialty pharmacy service for RxSp Oncology   Patient's specialty medication(s) reviewed today: Lomustine  (GLEOSTINE )   Missed doses in the last 4 weeks: 0   Patient/Caregiver did not have any additional questions or concerns.   Therapeutic benefit summary: Unable to assess   Adverse events/side effects summary: No adverse events/side effects   Patient's therapy is appropriate to: Continue    Goals Addressed             This Visit's Progress    Maintain optimal adherence to therapy   On track    Patient is on track. Patient will maintain adherence         Follow up: 3 months  [facility] Specialty Pharmacist

## 2024-06-24 ENCOUNTER — Ambulatory Visit: Admitting: Internal Medicine

## 2024-06-24 ENCOUNTER — Encounter: Payer: Self-pay | Admitting: *Deleted

## 2024-06-24 ENCOUNTER — Other Ambulatory Visit

## 2024-06-24 NOTE — Progress Notes (Signed)
 Requested images from [facility], MRI brain on 04/29/24.  Email to [employer]

## 2024-06-25 ENCOUNTER — Telehealth: Payer: Self-pay | Admitting: *Deleted

## 2024-06-25 NOTE — Telephone Encounter (Signed)
 PC to patient's wife [name], no answer, left VM - informed her patient's phone appointment has been changed to 06/28/24 at 10:00.  Instructed her to contact this office if the appointment needs to be changed.

## 2024-06-27 ENCOUNTER — Encounter: Payer: Self-pay | Admitting: Internal Medicine

## 2024-06-28 ENCOUNTER — Inpatient Hospital Stay: Admitting: Internal Medicine

## 2024-06-28 DIAGNOSIS — R569 Unspecified convulsions: Secondary | ICD-10-CM

## 2024-06-28 DIAGNOSIS — C719 Malignant neoplasm of brain, unspecified: Secondary | ICD-10-CM | POA: Diagnosis not present

## 2024-06-28 NOTE — Progress Notes (Signed)
 I connected with Nathan Prince on 06/28/24 at  3:00 PM EDT by telephone visit and verified that I am speaking with the correct person using two identifiers.  I discussed the limitations, risks, security and privacy concerns of performing an evaluation and management service by telemedicine and the availability of in-person appointments. I also discussed with the patient that there may be a patient responsible charge related to this service. The patient expressed understanding and agreed to proceed.   Other persons participating in the visit and their role in the encounter:  spouse  Patient's location:  Home Provider's location:  Office Chief Complaint:  Astrocytoma [facility])  Seizure (HCC)  History of Present Ilness: Nathan Prince reports clear improvement in speech output since starting the deacadron 4mg  daily.  The steroids is keeping him from sleeping fully through the night.  No frank seizures.  Observations: Language and cognition at baseline  Assessment and Plan: Astrocytoma (HCC)  Seizure (HCC)  Clinically improved; recommended decreasing decadron  to 2mg  daily if tolerated.  He has upcoming MRI and visit with [facility] team next week.  Follow Up Instructions: RTC following MRI with labs for evaluation  I discussed the assessment and treatment plan with the patient.  The patient was provided an opportunity to ask questions and all were answered.  The patient agreed with the plan and demonstrated understanding of the instructions.    The patient was advised to call back or seek an in-person evaluation if the symptoms worsen or if the condition fails to improve as anticipated.    Alanys Godino K Markisha Meding, MD   I provided 20 minutes of non face-to-face telephone visit time during this encounter, and > 50% was spent counseling as documented under my assessment & plan.

## 2024-06-29 ENCOUNTER — Other Ambulatory Visit: Payer: Self-pay

## 2024-07-02 ENCOUNTER — Other Ambulatory Visit: Payer: Self-pay

## 2024-07-05 ENCOUNTER — Inpatient Hospital Stay: Admitting: Internal Medicine

## 2024-07-05 ENCOUNTER — Telehealth: Payer: Self-pay | Admitting: *Deleted

## 2024-07-05 ENCOUNTER — Inpatient Hospital Stay

## 2024-07-05 ENCOUNTER — Other Ambulatory Visit: Payer: Self-pay

## 2024-07-05 NOTE — Telephone Encounter (Signed)
 Physician order for speech therapy received from [facility], signed by Dr Buckley & returned to fax #[phone].  Fax confirmation received.

## 2024-07-06 ENCOUNTER — Other Ambulatory Visit: Payer: Self-pay | Admitting: Internal Medicine

## 2024-07-06 ENCOUNTER — Other Ambulatory Visit (HOSPITAL_COMMUNITY): Payer: Self-pay

## 2024-07-06 DIAGNOSIS — C719 Malignant neoplasm of brain, unspecified: Secondary | ICD-10-CM

## 2024-07-08 ENCOUNTER — Inpatient Hospital Stay
Admission: RE | Admit: 2024-07-08 | Discharge: 2024-07-08 | Disposition: A | Payer: Self-pay | Source: Ambulatory Visit | Attending: Internal Medicine

## 2024-07-08 ENCOUNTER — Other Ambulatory Visit: Payer: Self-pay | Admitting: *Deleted

## 2024-07-08 ENCOUNTER — Encounter: Payer: Self-pay | Admitting: *Deleted

## 2024-07-08 ENCOUNTER — Other Ambulatory Visit: Payer: Self-pay

## 2024-07-08 DIAGNOSIS — C719 Malignant neoplasm of brain, unspecified: Secondary | ICD-10-CM

## 2024-07-08 NOTE — Progress Notes (Signed)
 Images requested from [facility], MRI brain on 07/05/24.  Email to [employer] to link images to outside films order.

## 2024-07-12 ENCOUNTER — Inpatient Hospital Stay

## 2024-07-12 ENCOUNTER — Inpatient Hospital Stay: Attending: Internal Medicine | Admitting: Internal Medicine

## 2024-07-12 ENCOUNTER — Other Ambulatory Visit: Payer: Self-pay

## 2024-07-12 VITALS — BP 137/87 | HR 78 | Temp 98.1°F | Resp 18 | Ht 70.0 in | Wt 230.5 lb

## 2024-07-12 DIAGNOSIS — C719 Malignant neoplasm of brain, unspecified: Secondary | ICD-10-CM | POA: Insufficient documentation

## 2024-07-12 DIAGNOSIS — Z7952 Long term (current) use of systemic steroids: Secondary | ICD-10-CM | POA: Diagnosis not present

## 2024-07-12 DIAGNOSIS — Z923 Personal history of irradiation: Secondary | ICD-10-CM | POA: Insufficient documentation

## 2024-07-12 DIAGNOSIS — D696 Thrombocytopenia, unspecified: Secondary | ICD-10-CM | POA: Diagnosis not present

## 2024-07-12 DIAGNOSIS — Z79630 Long term (current) use of alkylating agent: Secondary | ICD-10-CM | POA: Diagnosis not present

## 2024-07-12 DIAGNOSIS — Z85828 Personal history of other malignant neoplasm of skin: Secondary | ICD-10-CM | POA: Insufficient documentation

## 2024-07-12 DIAGNOSIS — R569 Unspecified convulsions: Secondary | ICD-10-CM

## 2024-07-12 DIAGNOSIS — C712 Malignant neoplasm of temporal lobe: Secondary | ICD-10-CM | POA: Insufficient documentation

## 2024-07-12 DIAGNOSIS — G40909 Epilepsy, unspecified, not intractable, without status epilepticus: Secondary | ICD-10-CM | POA: Diagnosis not present

## 2024-07-12 LAB — CBC WITH DIFFERENTIAL (CANCER CENTER ONLY)
Abs Immature Granulocytes: 0.01 K/uL (ref 0.00–0.07)
Basophils Absolute: 0 K/uL (ref 0.0–0.1)
Basophils Relative: 0 %
Eosinophils Absolute: 0.1 K/uL (ref 0.0–0.5)
Eosinophils Relative: 2 %
HCT: 42.9 % (ref 39.0–52.0)
Hemoglobin: 14.3 g/dL (ref 13.0–17.0)
Immature Granulocytes: 0 %
Lymphocytes Relative: 33 %
Lymphs Abs: 1.8 K/uL (ref 0.7–4.0)
MCH: 29.1 pg (ref 26.0–34.0)
MCHC: 33.3 g/dL (ref 30.0–36.0)
MCV: 87.4 fL (ref 80.0–100.0)
Monocytes Absolute: 0.3 K/uL (ref 0.1–1.0)
Monocytes Relative: 6 %
Neutro Abs: 3.2 K/uL (ref 1.7–7.7)
Neutrophils Relative %: 59 %
Platelet Count: 147 K/uL — ABNORMAL LOW (ref 150–400)
RBC: 4.91 MIL/uL (ref 4.22–5.81)
RDW: 13.8 % (ref 11.5–15.5)
WBC Count: 5.4 K/uL (ref 4.0–10.5)
nRBC: 0 % (ref 0.0–0.2)

## 2024-07-12 LAB — CMP (CANCER CENTER ONLY)
ALT: 19 U/L (ref 0–44)
AST: 14 U/L — ABNORMAL LOW (ref 15–41)
Albumin: 4.1 g/dL (ref 3.5–5.0)
Alkaline Phosphatase: 71 U/L (ref 38–126)
Anion gap: 6 (ref 5–15)
BUN: 23 mg/dL — ABNORMAL HIGH (ref 6–20)
CO2: 28 mmol/L (ref 22–32)
Calcium: 9.4 mg/dL (ref 8.9–10.3)
Chloride: 108 mmol/L (ref 98–111)
Creatinine: 1.05 mg/dL (ref 0.61–1.24)
GFR, Estimated: >60 mL/min (ref >60)
Glucose, Bld: 89 mg/dL (ref 70–99)
Potassium: 4.1 mmol/L (ref 3.5–5.1)
Sodium: 142 mmol/L (ref 135–145)
Total Bilirubin: 0.3 mg/dL (ref 0.0–1.2)
Total Protein: 6.7 g/dL (ref 6.5–8.1)

## 2024-07-12 MED ORDER — LOMUSTINE 10 MG PO CAPS
10.0000 mg | ORAL_CAPSULE | Freq: Once | ORAL | 0 refills | Status: AC
  Filled 2024-07-12: qty 1, 1d supply, fill #0

## 2024-07-12 MED ORDER — LOMUSTINE 40 MG PO CAPS
40.0000 mg | ORAL_CAPSULE | Freq: Once | ORAL | 0 refills | Status: AC
  Filled 2024-07-12: qty 1, 1d supply, fill #0
  Filled 2024-07-19: qty 1, 42d supply, fill #0

## 2024-07-12 MED ORDER — LOMUSTINE 100 MG PO CAPS
200.0000 mg | ORAL_CAPSULE | Freq: Once | ORAL | 0 refills | Status: AC
  Filled 2024-07-12: qty 2, 1d supply, fill #0
  Filled 2024-07-19: qty 2, 42d supply, fill #0

## 2024-07-12 NOTE — Progress Notes (Signed)
 [facility] at [facility] [address]  [place], [place] [address] [phone]   Interval Evaluation  Date of Service: 07/12/24 Patient Name: Nathan Prince Patient MRN: [id number] Patient DOB: 1966/02/01 Provider: Arthea MARLA Manns, MD  Identifying Statement:  Nathan Prince is a 58 y.o. 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 Pathology: astrocytoma (WHO Grade II) per patient report.  10/02/2011 Surgery Craniotomy for gross total resection performed by Peggye Li, MD. Pathology: anaplastic oligoastrocytoma (WHO Grade III).  - 12/31/2011 Radiation Radiographically stable after conclusion of radiation therapy with concurrent temozolomide  75 mg/m2 daily. Recommended proceeding with five day temozolomide , dosed at 150 mg/m2 for the first cycle and escalated to 200 mg/m2 for subsequent cycles if well tolerated.  01/01/2012 - 10/27/2012 Chemotherapy Eleven cycles of five day temozolomide . Hypometabolic PET scan. Recommended discontinuing temozolomide . Serial MRI monitoring initiated.  02/27/2021 Clinical Event-Other MRI on 02/27/21 reveals new very small area of enhancement medial to resection cavity best seen on axials, concerning for possible disease progression; note made of new left frontal enhancing lesion, which was not present on the 04/11/20 scan.  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 Peggye Li for resection of tumor.  03/27/2021 Radiology Study MRI brain 4 week follow up scan shows growth of the left frontal lesion. Increased left frontal convexity enhancing dural based lesion measuring 1.9 cm AP by 1.0 cm TV by 2.0 cm CC (1.4 x 0.7 x 1.8 cm on 02/27/2021 exam). Recommend resection with Dr. Li.  07/31/2021 Radiology Study MRI post radiation for left frontal meningioma and for left temporal oligoastrocytoma surveillance. These two sites are stable. Small enhancing focus medial to left temporal resection cavity is apparent on today's scan comparable to July 2022. Will follow up with a short term MRI in another 6 weeks.  Atypical meningioma of brain (CMS-HCC) 04/20/2021 Initial Diagnosis Atypical meningioma of brain (CMS-HCC)  04/20/2021 Surgery Resection of left frontal convexity mass with Peggye Li. Pathology: atypical meningioma, WHO grade II. TERT negative.  05/31/2021 - 07/16/2021 Radiation IMRT to the left frontal convexity resected typical meningioma total dose 57.6 Gy  07/31/2021 Radiology Study MRI post radiation for left frontal meningioma and for left temporal oligoastrocytoma surveillance. These two sites are stable. Small enhancing focus medial to left temporal resection cavity is apparent on today's scan comparable to July 2022. Will follow up with a short term MRI in another 6 weeks.  11/2022 - Chemotherapy  Tibsovo    04/29/24: Nodular progression left temporal; undergoes craniotomy, resection with Dr. Li.  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    06/04/24: Completes 25Gy/5 to resection site with Dr. Michaela  06/05/24: Doses cycle #1 Lomustine  110mg /m2   Biomarkers:  MGMT Unknown.  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  EGFR Unknown  TERT Unknown   Interval History: Nathan Prince presents today for follow up, now having completed cycle #1 of CCNU,  initially dosed  on 06/05/24.  No notable changes today.  Previously he and his wife reported worsening expressive language and balance which improved with decadron .  He is currently dosing 1mg  daily, decreased from 4mg .  No frank breakthrough seizures.  Remains on Onfi  10mg  daily in additiona to Vimpat  200mg  BID, Keppra  2000mg  BID.  He is now officially retired.  Depression and mood swings continue to be a problem at times.  H+P (11/04/22) Patient presents today to establish care for local treatment of progressive grade 3 astrocytoma, IDHmt.  He and his wife describe decline in function, energy, cognition, language expression since his hospitalization in January for status epilepticus.  He is still teaching full time, and is having difficulty in class with communication and overall energy.  Currently dosing Keppra  2000mg  BID, Vimpat  200mg  BID, and Depakote  1000mg  BID.  Not on any steroids.  He will be proceeding with Tibsovo  therapy per Nathan Prince recommendations.  Medications: Current Outpatient Medications on File Prior to Visit  Medication Sig Dispense Refill   Calcium Carb-Cholecalciferol (CALCIUM 600/VITAMIN D3 PO) Take 1 tablet by mouth 2 (two) times daily.     cloBAZam  (ONFI ) 10 MG tablet Take 1 tablet (10 mg total) by mouth at bedtime. 30 tablet 1   clonazePAM (KLONOPIN) 1 MG disintegrating tablet Take 1 mg by mouth daily as needed.     dexamethasone  (DECADRON ) 4 MG tablet Take 1 tablet (4 mg total) by mouth daily. 60 tablet 1   lacosamide  (VIMPAT ) 200 MG TABS tablet TAKE 1 TABLET BY MOUTH TWICE A DAY 60 tablet 3   levETIRAcetam  1000 MG TB24 Take 2,000 mg by mouth 2 (two) times daily. 112 tablet 2   melatonin 3 MG TABS tablet Take 6 mg by mouth at bedtime as needed.     Multiple Vitamins-Minerals (MULTIVITAMINS THER. W/MINERALS) TABS Take 1 tablet by mouth daily.      omeprazole (PRILOSEC) 20 MG capsule Take 20 mg by mouth daily.     ondansetron  (ZOFRAN ) 8 MG tablet Take 1 tablet (8 mg total) by  mouth every 8 (eight) hours as needed for nausea or vomiting. 30 tablet 1   pyridoxine (B-6) 100 MG tablet Take by mouth.     sertraline  (ZOLOFT ) 25 MG tablet TAKE 1 TABLET (25 MG TOTAL) BY MOUTH DAILY. (Patient taking differently: Take 50 mg by mouth daily.) 90 tablet 0   traZODone (DESYREL) 50 MG tablet Take 25 mg by mouth at bedtime.     Current Facility-Administered Medications on File Prior to Visit  Medication Dose Route Frequency Provider Last Rate Last Admin   topical emolient (BIAFINE) emulsion   Topical PRN Patrcia Cough, MD   Given at 11/15/11 1401    Allergies: No Known Allergies Past Medical History:  Past Medical History:  Diagnosis Date   Astrocytoma (HCC) 09/17/2011   subsequent oligodendroglioma in 2022   Depression    Low testosterone  03/30/2013   Radiation 11/04/11-12/19/11   Left temporal brain 59.4 gray in 33 fractions   Skin cancer 09/09/1986   Past Surgical History:  Past Surgical History:  Procedure Laterality Date   BACK SURGERY  09/09/1994   ruptured disc   CRANIOTOMY  09/04/2011   Procedure: CRANIOTOMY TUMOR EXCISION;  Surgeon: Catalina CHRISTELLA Stains;  Location: [facility];  Service: Neurosurgery;  Laterality: Left;  Left Temporal craniectomy for tumor   MASS BIOPSY  09/05/2011   left temporal lobe   skin cancer removal  1988   Removed skin cancer to left chest  Social History:  Social History   Socioeconomic History   Marital status: Married    Spouse name: Not on file   Number of children: Not on file   Years of education: Not on file   Highest education level: Not on file  Occupational History   Occupation: history teacher at [employer]  Tobacco Use   Smoking status: Never   Smokeless tobacco: Never   Tobacco comments:    never used tobacco  Vaping Use   Vaping status: Never Used  Substance and Sexual Activity   Alcohol use: No    Alcohol/week: 0.0 standard drinks of alcohol   Drug use: No   Sexual activity: Not on file  Other  Topics Concern   Not on file  Social History Narrative   Not on file   Social Drivers of Health   Financial Resource Strain: Low Risk  (04/30/2024)   Received from [facility]   Overall Financial Resource Strain (CARDIA)    Difficulty of Paying Living Expenses: Not hard at all  Food Insecurity: No Food Insecurity (04/30/2024)   Received from [facility]   Hunger Vital Sign    Within the past 12 months, you worried that your food would run out before you got the money to buy more.: Never true    Within the past 12 months, the food you bought just didn't last and you didn't have money to get more.: Never true  Transportation Needs: Unknown (04/30/2024)   Received from [facility] - Transportation    In the past 12 months, has lack of transportation kept you from medical appointments or from getting medications?: No    Lack of Transportation (Non-Medical): Not on file  Physical Activity: Not on file  Stress: Not on file  Social Connections: Not on file  Intimate Partner Violence: Not At Risk (09/24/2022)   Humiliation, Afraid, Rape, and Kick questionnaire    Fear of Current or Ex-Partner: No    Emotionally Abused: No    Physically Abused: No    Sexually Abused: No   Family History: No family history on file.  Review of Systems: Constitutional: Doesn't report fevers, chills or abnormal weight loss Eyes: Doesn't report blurriness of vision Ears, nose, mouth, throat, and face: Doesn't report sore throat Respiratory: Doesn't report cough, dyspnea or wheezes Cardiovascular: Doesn't report palpitation, chest discomfort  Gastrointestinal:  Doesn't report nausea, constipation, diarrhea GU: Doesn't report incontinence Skin: Doesn't report skin rashes Neurological: Per HPI Musculoskeletal: Doesn't report joint pain Behavioral/Psych: Doesn't report anxiety  Physical Exam: Vitals:   07/12/24 0935  BP: 137/87  Pulse: 78   Resp: 18  Temp: 98.1 F (36.7 C)    KPS: 70. General: Alert, cooperative, pleasant, in no acute distress Head: Craniotomy scar EENT: No conjunctival injection or scleral icterus.  Lungs: Resp effort normal Cardiac: Regular rate Abdomen: Non-distended abdomen Skin: No rashes cyanosis or petechiae. Extremities: No clubbing or edema  Neurologic Exam: Mental Status: Awake, alert, attentive to examiner. Oriented to self and environment. Language is modestly impaired with regards to fluency.  Age advanced pyschomotor slowing Cranial Nerves: Visual acuity is grossly normal. Visual fields are full. Extra-ocular movements intact. No ptosis. Face is symmetric Motor: Tone and bulk are normal. Power is full in both arms and legs. Reflexes are symmetric, no pathologic reflexes present.  Sensory: Intact to light touch Gait: Dystaxic   Labs: I have reviewed the data as listed  Component Value Date/Time   NA 142 06/21/2024 1311   NA 141 10/28/2022 0946   NA 140 03/28/2015 0957   K 4.1 06/21/2024 1311   K 3.9 03/28/2015 0957   CL 108 06/21/2024 1311   CL 104 03/28/2015 0957   CO2 27 06/21/2024 1311   CO2 26 03/28/2015 0957   GLUCOSE 119 (H) 06/21/2024 1311   GLUCOSE 124 (H) 03/28/2015 0957   BUN 18 06/21/2024 1311   BUN 16 10/28/2022 0946   BUN 14 03/28/2015 0957   CREATININE 1.03 06/21/2024 1311   CREATININE 1.1 03/28/2015 0957   CALCIUM 10.0 06/21/2024 1311   CALCIUM 9.0 03/28/2015 0957   PROT 6.9 06/21/2024 1311   PROT 6.9 10/28/2022 0946   PROT 7.2 03/28/2015 0957   ALBUMIN 4.4 06/21/2024 1311   ALBUMIN 4.5 10/28/2022 0946   AST 16 06/21/2024 1311   ALT 23 06/21/2024 1311   ALT 27 03/28/2015 0957   ALKPHOS 75 06/21/2024 1311   ALKPHOS 81 03/28/2015 0957   BILITOT 0.4 06/21/2024 1311   GFRNONAA >60 06/21/2024 1311   GFRAA >90 09/05/2011 0402   Lab Results  Component Value Date   WBC 5.4 07/12/2024   NEUTROABS 3.2 07/12/2024   HGB 14.3 07/12/2024   HCT 42.9  07/12/2024   MCV 87.4 07/12/2024   PLT 147 (L) 07/12/2024   Imaging:  [facility] Clinician Interpretation: I have personally reviewed the CNS images as listed.  My interpretation, in the context of the patient's clinical presentation, is likely treatment effect  INDICATION:     COMPARISON: MRI brain April 29, 2024   TECHNIQUE/PROTOCOL: standard adult brain protocol   CONTRAST This MRI was performed before and after IV administration of  contrast material. IV contrast material was administered to improve disease  detection   FINDINGS:  * A left temporal craniotomy and a left temporal lobe resection cavity are  present  * In general, a decrease in the degree of contrast contrast-enhancing  tissue is seen in the left temporal lobe. A rim-contrast-enhancing region  in the left insula is slightly increased in size (series 17 image 82)  *  A mild decrease in size of the region of hyperintense signal on FLAIR  images in the left temporal lobe is present with decreased mass effect as  evidenced by lack of compression of the left Sylvian fissure  *  A decrease in the size of the region of hyperintense signal on DWI  images is evident  *  A decrease in the amount of hemorrhagic products in the surgical cavity  is present  There is no recent infarct, intracranial hemorrhage, mass, or mass-effect.  The extra-axial spaces, basal cisterns, ventricles and sulci are normal.  Intracranial flow-voids appear normal. The orbits, cranium, mastoid  sinuses, and visualized paranasal sinuses are unremarkable.   IMPRESSION: A decrease in the degree of contrast enhancement, mass effect  and abnormal signal on FLAIR and DWI images is seen in the left temporal  lobe. A small region of rim contrast enhancement in the left temporal lobe  is slightly increased in size   Electronically Signed by:  Lynwood Bending, MD, [facility]  Electronically Signed on:  07/08/2024 11:09 AM     Assessment/Plan Astrocytoma (HCC)  Seizure (HCC)  Nathan Prince presents today, now having completed cycle #1 CCNU following re-irradiation therapy on 9/26.  Labs demonstrate modest improvement in thrombocytopenia today.  MRI at [facility] demonstrated stable findings, with some increased enhancement within radiation field only likely c/w  treatment effect.  Patient elected to continue with cycle #2 oral CCNU 110mg /m2 q6 weeks.  We reviewed side effects of CCNU, including fatigue, nausea vomiting, cytopenias, ILD.  We reviewed side effects of avastin, including hypertension, bleeding/clotting events, wound healing impairment.  The patient will have a complete blood count, a comprehensive metabolic panel q2 weeks. Labs may need to be performed more often. Zofran  will prescribed for home use for nausea/vomiting.   Recommended waiting and additional 2 weeks to dose CCNU given thrombocytopenia following cycle #1.  This will allow for additional bone marrow recovery and decrease the chance of future dose reduction or cessation.  Chemotherapy should be held for the following:  ANC less than 1,000  Platelets less than 100,000  LFT or creatinine greater than 2x ULN  If clinical concerns/contraindications develop  Ok to discontinue decadron  if tolerated.  Will con't Keppra  (2000mg  BID ER), Vimpat  (200mg  BID) and Onfi  (10mg  daily) at this time.    May con't zoloft  25mg  daily for depression symptoms.    We ask that Nathan Prince return to clinic in 6 weeks following week 4 lab draw during cycle #2, or sooner as needed.  Labs will also be drawn in 2 weeks.  All questions were answered. The patient knows to call the clinic with any problems, questions or concerns. No barriers to learning were detected.  The total time spent in the encounter was 40 minutes and more than 50% was on counseling and review of test results   Arthea MARLA Manns, MD Medical Director of [facility] at [facility] 07/12/24 9:33 AM

## 2024-07-12 NOTE — Addendum Note (Signed)
 Addended by: Yaqueline Gutter K on: 07/12/2024 10:39 AM   Modules accepted: Orders

## 2024-07-13 ENCOUNTER — Other Ambulatory Visit: Payer: Self-pay

## 2024-07-19 ENCOUNTER — Other Ambulatory Visit: Payer: Self-pay

## 2024-07-21 ENCOUNTER — Other Ambulatory Visit: Payer: Self-pay

## 2024-07-21 ENCOUNTER — Telehealth: Payer: Self-pay | Admitting: Internal Medicine

## 2024-07-21 NOTE — Telephone Encounter (Signed)
 Scheduled a lab appointment for 11/17 per Dagoberto in staff message. Called and left a voicemail with the details.

## 2024-07-21 NOTE — Progress Notes (Signed)
 Specialty Pharmacy Refill Coordination Note  Nathan Prince is a 58 y.o. male contacted today regarding refills of specialty medication(s) Lomustine  (GLEOSTINE )   Patient requested [name] at [facility] at [place] date: 07/22/24   Medication will be filled on: 07/21/24

## 2024-07-26 ENCOUNTER — Inpatient Hospital Stay

## 2024-07-26 DIAGNOSIS — C719 Malignant neoplasm of brain, unspecified: Secondary | ICD-10-CM

## 2024-07-26 LAB — CBC WITH DIFFERENTIAL (CANCER CENTER ONLY)
Abs Immature Granulocytes: 0.02 K/uL (ref 0.00–0.07)
Basophils Absolute: 0 K/uL (ref 0.0–0.1)
Basophils Relative: 1 %
Eosinophils Absolute: 0 K/uL (ref 0.0–0.5)
Eosinophils Relative: 1 %
HCT: 47.4 % (ref 39.0–52.0)
Hemoglobin: 15.8 g/dL (ref 13.0–17.0)
Immature Granulocytes: 1 %
Lymphocytes Relative: 27 %
Lymphs Abs: 1.2 K/uL (ref 0.7–4.0)
MCH: 29.1 pg (ref 26.0–34.0)
MCHC: 33.3 g/dL (ref 30.0–36.0)
MCV: 87.3 fL (ref 80.0–100.0)
Monocytes Absolute: 0.6 K/uL (ref 0.1–1.0)
Monocytes Relative: 13 %
Neutro Abs: 2.5 K/uL (ref 1.7–7.7)
Neutrophils Relative %: 57 %
Platelet Count: 162 K/uL (ref 150–400)
RBC: 5.43 MIL/uL (ref 4.22–5.81)
RDW: 13.8 % (ref 11.5–15.5)
WBC Count: 4.3 K/uL (ref 4.0–10.5)
nRBC: 0 % (ref 0.0–0.2)

## 2024-07-26 LAB — CMP (CANCER CENTER ONLY)
ALT: 20 U/L (ref 0–44)
AST: 20 U/L (ref 15–41)
Albumin: 4.5 g/dL (ref 3.5–5.0)
Alkaline Phosphatase: 80 U/L (ref 38–126)
Anion gap: 7 (ref 5–15)
BUN: 19 mg/dL (ref 6–20)
CO2: 30 mmol/L (ref 22–32)
Calcium: 10.1 mg/dL (ref 8.9–10.3)
Chloride: 104 mmol/L (ref 98–111)
Creatinine: 1.1 mg/dL (ref 0.61–1.24)
GFR, Estimated: >60 mL/min (ref >60)
Glucose, Bld: 91 mg/dL (ref 70–99)
Potassium: 4.6 mmol/L (ref 3.5–5.1)
Sodium: 141 mmol/L (ref 135–145)
Total Bilirubin: 0.3 mg/dL (ref 0.0–1.2)
Total Protein: 7.1 g/dL (ref 6.5–8.1)

## 2024-08-09 ENCOUNTER — Inpatient Hospital Stay: Attending: Internal Medicine

## 2024-08-09 DIAGNOSIS — Z79630 Long term (current) use of alkylating agent: Secondary | ICD-10-CM | POA: Diagnosis not present

## 2024-08-09 DIAGNOSIS — Z7952 Long term (current) use of systemic steroids: Secondary | ICD-10-CM | POA: Insufficient documentation

## 2024-08-09 DIAGNOSIS — R197 Diarrhea, unspecified: Secondary | ICD-10-CM | POA: Insufficient documentation

## 2024-08-09 DIAGNOSIS — C712 Malignant neoplasm of temporal lobe: Secondary | ICD-10-CM | POA: Diagnosis present

## 2024-08-09 DIAGNOSIS — Z85828 Personal history of other malignant neoplasm of skin: Secondary | ICD-10-CM | POA: Insufficient documentation

## 2024-08-09 DIAGNOSIS — Z79899 Other long term (current) drug therapy: Secondary | ICD-10-CM | POA: Insufficient documentation

## 2024-08-09 DIAGNOSIS — C719 Malignant neoplasm of brain, unspecified: Secondary | ICD-10-CM

## 2024-08-09 DIAGNOSIS — R112 Nausea with vomiting, unspecified: Secondary | ICD-10-CM | POA: Diagnosis not present

## 2024-08-09 LAB — CMP (CANCER CENTER ONLY)
ALT: 26 U/L (ref 0–44)
AST: 24 U/L (ref 15–41)
Albumin: 4.6 g/dL (ref 3.5–5.0)
Alkaline Phosphatase: 112 U/L (ref 38–126)
Anion gap: 10 (ref 5–15)
BUN: 13 mg/dL (ref 6–20)
CO2: 27 mmol/L (ref 22–32)
Calcium: 9.7 mg/dL (ref 8.9–10.3)
Chloride: 104 mmol/L (ref 98–111)
Creatinine: 1.12 mg/dL (ref 0.61–1.24)
GFR, Estimated: >60 mL/min (ref >60)
Glucose, Bld: 88 mg/dL (ref 70–99)
Potassium: 4.2 mmol/L (ref 3.5–5.1)
Sodium: 141 mmol/L (ref 135–145)
Total Bilirubin: 0.3 mg/dL (ref 0.0–1.2)
Total Protein: 7.3 g/dL (ref 6.5–8.1)

## 2024-08-09 LAB — CBC WITH DIFFERENTIAL (CANCER CENTER ONLY)
Abs Immature Granulocytes: 0.01 K/uL (ref 0.00–0.07)
Basophils Absolute: 0 K/uL (ref 0.0–0.1)
Basophils Relative: 0 %
Eosinophils Absolute: 0 K/uL (ref 0.0–0.5)
Eosinophils Relative: 1 %
HCT: 46.3 % (ref 39.0–52.0)
Hemoglobin: 15.7 g/dL (ref 13.0–17.0)
Immature Granulocytes: 0 %
Lymphocytes Relative: 22 %
Lymphs Abs: 1.2 K/uL (ref 0.7–4.0)
MCH: 29.8 pg (ref 26.0–34.0)
MCHC: 33.9 g/dL (ref 30.0–36.0)
MCV: 88 fL (ref 80.0–100.0)
Monocytes Absolute: 0.7 K/uL (ref 0.1–1.0)
Monocytes Relative: 12 %
Neutro Abs: 3.4 K/uL (ref 1.7–7.7)
Neutrophils Relative %: 65 %
Platelet Count: 172 K/uL (ref 150–400)
RBC: 5.26 MIL/uL (ref 4.22–5.81)
RDW: 14.8 % (ref 11.5–15.5)
WBC Count: 5.3 K/uL (ref 4.0–10.5)
nRBC: 0 % (ref 0.0–0.2)

## 2024-08-13 ENCOUNTER — Other Ambulatory Visit: Payer: Self-pay

## 2024-08-17 ENCOUNTER — Other Ambulatory Visit: Payer: Self-pay | Admitting: *Deleted

## 2024-08-17 DIAGNOSIS — R4701 Aphasia: Secondary | ICD-10-CM

## 2024-08-17 DIAGNOSIS — C719 Malignant neoplasm of brain, unspecified: Secondary | ICD-10-CM

## 2024-08-19 ENCOUNTER — Telehealth: Payer: Self-pay | Admitting: *Deleted

## 2024-08-19 NOTE — Telephone Encounter (Signed)
 Physician order signed by Dr Buckley & returned to [facility], fax #[phone].  Fax confirmation received.

## 2024-08-20 ENCOUNTER — Inpatient Hospital Stay

## 2024-08-20 DIAGNOSIS — C719 Malignant neoplasm of brain, unspecified: Secondary | ICD-10-CM

## 2024-08-20 DIAGNOSIS — C712 Malignant neoplasm of temporal lobe: Secondary | ICD-10-CM | POA: Diagnosis not present

## 2024-08-20 LAB — CMP (CANCER CENTER ONLY)
ALT: 27 U/L (ref 0–44)
AST: 27 U/L (ref 15–41)
Albumin: 4.7 g/dL (ref 3.5–5.0)
Alkaline Phosphatase: 98 U/L (ref 38–126)
Anion gap: 12 (ref 5–15)
BUN: 18 mg/dL (ref 6–20)
CO2: 26 mmol/L (ref 22–32)
Calcium: 10 mg/dL (ref 8.9–10.3)
Chloride: 102 mmol/L (ref 98–111)
Creatinine: 1.14 mg/dL (ref 0.61–1.24)
GFR, Estimated: >60 mL/min (ref >60)
Glucose, Bld: 90 mg/dL (ref 70–99)
Potassium: 4.1 mmol/L (ref 3.5–5.1)
Sodium: 139 mmol/L (ref 135–145)
Total Bilirubin: 0.3 mg/dL (ref 0.0–1.2)
Total Protein: 7.6 g/dL (ref 6.5–8.1)

## 2024-08-20 LAB — CBC WITH DIFFERENTIAL (CANCER CENTER ONLY)
Abs Immature Granulocytes: 0.05 K/uL (ref 0.00–0.07)
Basophils Absolute: 0.1 K/uL (ref 0.0–0.1)
Basophils Relative: 1 %
Eosinophils Absolute: 0 K/uL (ref 0.0–0.5)
Eosinophils Relative: 1 %
HCT: 45.2 % (ref 39.0–52.0)
Hemoglobin: 15.4 g/dL (ref 13.0–17.0)
Immature Granulocytes: 1 %
Lymphocytes Relative: 22 %
Lymphs Abs: 1.4 K/uL (ref 0.7–4.0)
MCH: 30 pg (ref 26.0–34.0)
MCHC: 34.1 g/dL (ref 30.0–36.0)
MCV: 87.9 fL (ref 80.0–100.0)
Monocytes Absolute: 0.7 K/uL (ref 0.1–1.0)
Monocytes Relative: 10 %
Neutro Abs: 4.4 K/uL (ref 1.7–7.7)
Neutrophils Relative %: 65 %
Platelet Count: 106 K/uL — ABNORMAL LOW (ref 150–400)
RBC: 5.14 MIL/uL (ref 4.22–5.81)
RDW: 15.6 % — ABNORMAL HIGH (ref 11.5–15.5)
WBC Count: 6.6 K/uL (ref 4.0–10.5)
nRBC: 0 % (ref 0.0–0.2)

## 2024-08-20 NOTE — Therapy (Signed)
 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   Depression    Low testosterone  03/30/2013   Radiation 11/04/11-12/19/11   Left temporal brain 59.4 gray in 33 fractions   Skin cancer 09/09/1986   Past Surgical History:  Procedure Laterality Date   BACK SURGERY  09/09/1994   ruptured disc   CRANIOTOMY  09/04/2011   Procedure: CRANIOTOMY TUMOR EXCISION;  Surgeon: Catalina CHRISTELLA Stains;  Location: [facility];  Service: Neurosurgery;  Laterality: Left;  Left Temporal craniectomy for tumor   MASS BIOPSY  09/05/2011   left temporal lobe   skin cancer removal  1988   Removed skin cancer to left chest   Patient Active Problem List   Diagnosis Date Noted   Stroke-like symptoms 09/22/2022   Seizure (HCC) 09/22/2022   Low testosterone  03/30/2013   Depression    Skin cancer    Radiation    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 Elray) And you know - his time was really good. She came to see me and she was great. You know, say me more (For tell me more)  Pt  accompanied by: significant other  PERTINENT HISTORY: Pt has had a course of home health ST between 05-10-24 and today. VASLOW-07/12/24: 04/20/2021 Initial Diagnosis Atypical meningioma of brain (CMS-HCC)  04/20/2021 Surgery Resection of left frontal convexity mass with Peggye Li. Pathology: atypical meningioma, WHO grade II. TERT negative.  05/31/2021 - 07/16/2021 Radiation IMRT to the left frontal convexity resected typical meningioma total dose 57.6 Gy  07/31/2021 Radiology Study MRI post radiation for left frontal meningioma and for left temporal oligoastrocytoma surveillance. These two sites are stable. Small enhancing focus medial to left temporal resection cavity is apparent on today's scan comparable to July 2022. Will follow up with a short term MRI in another 6 weeks.   11/2022 - Chemotherapy  Tibsovo     04/29/24: Nodular progression left temporal; undergoes craniotomy, resection with Dr. Li.  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     06/04/24: Completes 25Gy/5 to resection site with Dr. 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 An MRI in October showed reduced inflammation and tumor size. He experiences transcortical motor aphasia, with increased difficulty in the afternoon,  and a visual field defect with right superior quadrantanopia. No new seizures have occurred since surgery, indicating effective tumor control. Continue the current antiepileptic regimen and speech therapy.   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  The current antiepileptic regimen includes Lacosamide , Levetiracetam , Clonazepam, and Clobazam , with some noted fatigue but no significant side effects. Seizure control is prioritized over potential medication side effects. Continue the current antiepileptic regimen and monitor for seizure recurrence. Consider weaning medications if seizure-free for six months.   Transcortical motor aphasia   Post-surgical language difficulties are characterized by transcortical motor aphasia, with word finding and substitution issues, particularly in the afternoon. Speech therapy is ongoing. Continue speech therapy.   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   Gait disturbance   A slightly broad-based stooped gait is more pronounced with increased activity, likely related to post-surgical changes. No concerning features like Parkinsonism are present. Continue to monitor gait changes.  PAIN:  Are you having pain? No  FALLS: Has patient fallen in last 6 months?  No  LIVING ENVIRONMENT: Lives with: lives with their spouse Lives in: House/apartment  PLOF:  Level of assistance: Independent with ADLs, Needed assistance with IADLS Employment: Retired but would like to return to work  PATIENT GOALS: Improve to the point of being able to work  OBJECTIVE:  Note: Objective measures were completed at Evaluation unless otherwise noted.  DIAGNOSTIC FINDINGS:   COGNITION: Overall cognitive status: Within functional limits for tasks assessed  AUDITORY COMPREHENSION: Overall auditory comprehension: Impaired: moderately complex and complex Conversation: Complex and Moderately Complex Comments: Pt was definitely functional in evaluation session today with auditory comprehension in simple to simple-mod complex conversation Interfering components: processing speed Effective technique: extra processing time and repetition/stressing words  READING  COMPREHENSION: Impaired: sentence and paragraph  EXPRESSION: verbal  VERBAL EXPRESSION: Level of generative/spontaneous verbalization: conversation Repetition: Appears intact Naming: Confrontation: 26-50% - 15/30 [place] Naming Test Pragmatics: Appears intact Comments: Pt was definitely functional in evaluation session today with verbal expression in simple to simple-mod complex conversation. I have to have an ear out when he pays bills online, [name] said. Interfering components: anatomical changes to brain Effective technique: extra time and minimizing negative feeilngs about errors Non-verbal means of communication: gestures  WRITTEN EXPRESSION: Dominant hand: right Written expression: Not tested, however [name] shared pt's texts are not functional 100% of the time  MOTOR SPEECH:  Overall motor speech: Appears intact Respiration: thoracic breathing Phonation: normal Resonance: WFL Articulation: Appears intact Intelligibility: Intelligible Motor planning: Appears intact  ORAL MOTOR EXAMINATION: Overall status: Did not assess due to time - will assess next session  STANDARDIZED ASSESSMENTS: [place] NAMING TEST: (BNT-2): Pt scored 15/30 with odds presentation of BNT-2 which is >2 standard deviations below the mean.  PATIENT REPORTED OUTCOME MEASURES (PROM): Communication Effectiveness Survey: will be completed in first 2 therapy sessions  TREATMENT DATE:   08/23/24: Pt needs QAB and PROM next session. 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 SLP ascertained that pt largely does not benefit from phonemic cues and shared this with Ray and [name]. Lastly, SLP talked briefly with pt and wife  about past ST and tasks that SLP performed with Ray. SLP encouraged Ray to cont with those tasks at home for more practice.   PATIENT EDUCATION: Education details: see treatment date Person educated: Patient and Spouse Education method: Explanation, Demonstration, and Handouts Education comprehension: verbalized understanding and needs further education   GOALS: Goals reviewed with patient? No  SHORT TERM GOALS: Target date: 09/24/24  Pt will name 8 items in a simple-mod complex category, using trained compensations, 85% of opportunities   Baseline: Goal status: INITIAL  2.  Pt will demo use of trained compensatory strategies in 5 minutes simple conversation x3 sessions Baseline:  Goal status: INITIAL  3.  Pt will develop a 1-minute presentation on topic of choice using trained compensations  Baseline:  Goal status: INITIAL  4.  Pt will deliver a 1-minute presentation using trained compensations Baseline:  Goal status: INITIAL  5.  Pt will demo use of speech to text for functional completion of 1-sentence text messages Baseline:  Goal status: INITIAL  6.  With trained compensations pt will complete billpay via telephone with 100% success in 2/2 opportunities Baseline:  Goal status: INITIAL  7.  Pt will complete QAB or other aphasia assessment Baseline:  Goal status: INITIAL  LONG TERM GOALS: Target date: 10/08/24  Pt will develop a 3-minute presentation about a topic of choice with mod I Baseline:  Goal status: INITIAL  2.  Pt will deliver a 3-minute presentation with mod I Baseline:  Goal status: INITIAL  3.  Pt will demo use of speech to text for functional completion of 2-sentence text messages   Baseline:  Goal status: INITIAL  4.  With trained compensations pt will complete billpay via telephone with 100% success in at least 2/2 opportunities Baseline:  Goal status: INITIAL  5.  Pt will implement trained compensations in 3 opportunities, between  sessions, when he thinks he is getting anxious about making errors in verbal expression outside of ST Baseline:  Goal status: INITIAL  5.  Pt will improve PROM Baseline:  Goal status: INITIAL   ASSESSMENT:  CLINICAL IMPRESSION: Patient is a 58 y.o. M who was seen today for assessment of speech and language in light of hx of seizures and of tumor resection. Pt is in week 5 of a 6-week oral chemo regimen following resection of lt sided brain tumor in August 2025 and radiation to the affected area. He has had a course of HHST since that sx. Functionally, pt was forced to retire recently, and has difficulty with verbal expression so that [name] (wife) needs to listen when pt pays bills via phone, and that pt uses pronouns interchangeably at times making tracking his conversation challenging at times. Lastly, his language declines when he experiences difficulty due to anxiety about making errors. [name] indicated pt is still working through some depression regarding his current situation. 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  OBJECTIVE IMPAIRMENTS: include aphasia. These impairments are limiting patient from managing medications, managing appointments, managing finances, household  responsibilities, ADLs/IADLs, and effectively communicating at home and in community. Factors affecting potential to achieve goals and functional outcome are co-morbidities, medical prognosis, and severity of impairments. Patient will benefit from skilled SLP services to address above impairments and improve overall function.  REHAB POTENTIAL: Fair given above factors  PLAN:  SLP FREQUENCY: 2x/week  SLP DURATION: 6 weeks  PLANNED INTERVENTIONS: Language facilitation, Environmental controls, Cueing hierachy, Internal/external aids, Functional tasks, Multimodal communication approach, SLP instruction and feedback, Compensatory strategies, Patient/family education,  and [id number] Treatment of speech (30 or 45 min)     Othar Curto, CCC-SLP 08/23/2024, 11:11 AM

## 2024-08-22 ENCOUNTER — Other Ambulatory Visit: Payer: Self-pay

## 2024-08-23 ENCOUNTER — Inpatient Hospital Stay: Admitting: Internal Medicine

## 2024-08-23 ENCOUNTER — Other Ambulatory Visit: Payer: Self-pay

## 2024-08-23 ENCOUNTER — Inpatient Hospital Stay

## 2024-08-23 ENCOUNTER — Ambulatory Visit: Attending: Internal Medicine

## 2024-08-23 VITALS — BP 144/89 | HR 81 | Temp 97.8°F | Resp 18 | Ht 70.0 in | Wt 228.9 lb

## 2024-08-23 DIAGNOSIS — R569 Unspecified convulsions: Secondary | ICD-10-CM | POA: Diagnosis not present

## 2024-08-23 DIAGNOSIS — R4701 Aphasia: Secondary | ICD-10-CM | POA: Diagnosis present

## 2024-08-23 DIAGNOSIS — C719 Malignant neoplasm of brain, unspecified: Secondary | ICD-10-CM | POA: Insufficient documentation

## 2024-08-23 DIAGNOSIS — C712 Malignant neoplasm of temporal lobe: Secondary | ICD-10-CM | POA: Diagnosis not present

## 2024-08-23 NOTE — Progress Notes (Signed)
 [facility] [address]  [place], [place] [address] [phone]   Interval Evaluation  Date of Service: 08/23/2024 Patient Name: Nathan Prince Patient MRN: [id number] Patient DOB: 04/10/1966 Provider: Arthea MARLA Manns, MD  Identifying Statement:  Nathan Prince is a 58 y.o. 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 Pathology: astrocytoma (WHO Grade II) per patient report.  10/02/2011 Surgery Craniotomy for gross total resection performed by Peggye Li, MD. Pathology: anaplastic oligoastrocytoma (WHO Grade III).  - 12/31/2011 Radiation Radiographically stable after conclusion of radiation therapy with concurrent temozolomide  75 mg/m2 daily. Recommended proceeding with five day temozolomide , dosed at 150 mg/m2 for the first cycle and escalated to 200 mg/m2 for subsequent cycles if well tolerated.  01/01/2012 - 10/27/2012 Chemotherapy Eleven cycles of five day temozolomide . Hypometabolic PET scan. Recommended discontinuing temozolomide . Serial MRI monitoring initiated.  02/27/2021 Clinical Event-Other MRI on 02/27/21 reveals new very small area of enhancement medial to resection cavity best seen on axials, concerning for possible disease progression; note made of new left frontal enhancing lesion, which was not present on the 04/11/20 scan.  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 Peggye Li for resection of tumor.  03/27/2021 Radiology Study MRI brain 4 week follow up scan shows growth of the left frontal lesion. Increased left frontal convexity enhancing dural based lesion measuring 1.9 cm AP by 1.0 cm TV by 2.0 cm CC (1.4 x 0.7 x 1.8 cm on 02/27/2021 exam). Recommend resection with Dr. Li.  07/31/2021 Radiology Study MRI post radiation for left frontal meningioma and for left temporal oligoastrocytoma surveillance. These two sites are stable. Small enhancing focus medial to left temporal resection cavity is apparent on today's scan comparable to July 2022. Will follow up with a short term MRI in another 6 weeks.  Atypical meningioma of brain (CMS-HCC) 04/20/2021 Initial Diagnosis Atypical meningioma of brain (CMS-HCC)  04/20/2021 Surgery Resection of left frontal convexity mass with Peggye Li. Pathology: atypical meningioma, WHO grade II. TERT negative.  05/31/2021 - 07/16/2021 Radiation IMRT to the left frontal convexity resected typical meningioma total dose 57.6 Gy  07/31/2021 Radiology Study MRI post radiation for left frontal meningioma and for left temporal oligoastrocytoma surveillance. These two sites are stable. Small enhancing focus medial to left temporal resection cavity is apparent on today's scan comparable to July 2022. Will follow up with a short term MRI in another 6 weeks.  11/2022 - Chemotherapy  Tibsovo    04/29/24: Nodular progression left temporal; undergoes craniotomy, resection with Dr. Li.  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    06/04/24: Completes 25Gy/5 to resection site with Dr. Michaela  06/05/24: Doses cycle #1 Lomustine  110mg /m2   Biomarkers:  MGMT Unknown.  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  EGFR Unknown  TERT Unknown   Interval History: Nathan Prince presents today for follow up, now having dosed cycle #2 of CCNU on  07/26/24.  No notable changes today.  He did experience some self limited diarrhea (again) following dosing of chemotherapy.  He has formally discontinued decadron .  No frank breakthrough seizures.  Remains on Onfi  10mg  daily in addition to Vimpat  200mg  BID, Keppra  2000mg  BID.  Depression and mood swings continue to be a problem at times.  H+P (11/04/22) Patient presents today to establish care for local treatment of progressive grade 3 astrocytoma, IDHmt.  He and his wife describe decline in function, energy, cognition, language expression since his hospitalization in January for status epilepticus.  He is still teaching full time, and is having difficulty in class with communication and overall energy.  Currently dosing Keppra  2000mg  BID, Vimpat  200mg  BID, and Depakote  1000mg  BID.  Not on any steroids.  He will be proceeding with Tibsovo  therapy per [facility] team recommendations.  Medications: Current Outpatient Medications on File Prior to Visit  Medication Sig Dispense Refill   Calcium Carb-Cholecalciferol (CALCIUM 600/VITAMIN D3 PO) Take 1 tablet by mouth 2 (two) times daily.     cloBAZam  (ONFI ) 10 MG tablet Take 1 tablet (10 mg total) by mouth at bedtime. 30 tablet 1   clonazePAM (KLONOPIN) 1 MG disintegrating tablet Take 1 mg by mouth daily as needed.     dexamethasone  (DECADRON ) 4 MG tablet Take 1 tablet (4 mg total) by mouth daily. (Patient not taking: Reported on 08/23/2024) 60 tablet 1   lacosamide  (VIMPAT ) 200 MG TABS tablet TAKE 1 TABLET BY MOUTH TWICE A DAY 60 tablet 3   levETIRAcetam  1000 MG TB24 Take 2,000 mg by mouth 2 (two) times daily. 112 tablet 2   melatonin 3 MG TABS tablet Take 6 mg by mouth at bedtime as needed.     Multiple Vitamins-Minerals (MULTIVITAMINS THER. W/MINERALS) TABS Take 1 tablet by mouth daily.      omeprazole (PRILOSEC) 20 MG capsule Take 20 mg by mouth daily. (Patient not taking: Reported on 08/23/2024)     ondansetron  (ZOFRAN ) 8 MG tablet Take 1 tablet (8 mg  total) by mouth every 8 (eight) hours as needed for nausea or vomiting. 30 tablet 1   pyridoxine (B-6) 100 MG tablet Take by mouth.     sertraline  (ZOLOFT ) 25 MG tablet TAKE 1 TABLET (25 MG TOTAL) BY MOUTH DAILY. (Patient taking differently: Take 100 mg by mouth daily.) 90 tablet 0   traZODone (DESYREL) 50 MG tablet Take 25 mg by mouth at bedtime.     Current Facility-Administered Medications on File Prior to Visit  Medication Dose Route Frequency Provider Last Rate Last Admin   topical emolient (BIAFINE) emulsion   Topical PRN Patrcia Cough, MD   Given at 11/15/11 1401    Allergies: No Known Allergies Past Medical History:  Past Medical History:  Diagnosis Date   Astrocytoma (HCC) 09/17/2011   subsequent oligodendroglioma in 2022   Depression    Low testosterone  03/30/2013   Radiation 11/04/11-12/19/11   Left temporal brain 59.4 gray in 33 fractions   Skin cancer 09/09/1986   Past Surgical History:  Past Surgical History:  Procedure Laterality Date   BACK SURGERY  09/09/1994   ruptured disc   CRANIOTOMY  09/04/2011   Procedure: CRANIOTOMY TUMOR EXCISION;  Surgeon: Catalina CHRISTELLA Stains;  Location: [facility];  Service: Neurosurgery;  Laterality: Left;  Left Temporal craniectomy for tumor   MASS BIOPSY  09/05/2011   left temporal lobe   skin cancer removal  1988   Removed skin cancer to left chest   Social History:  Social  History   Socioeconomic History   Marital status: Married    Spouse name: Not on file   Number of children: Not on file   Years of education: Not on file   Highest education level: Not on file  Occupational History   Occupation: history teacher at [employer]  Tobacco Use   Smoking status: Never   Smokeless tobacco: Never   Tobacco comments:    never used tobacco  Vaping Use   Vaping status: Never Used  Substance and Sexual Activity   Alcohol use: No    Alcohol/week: 0.0 standard drinks of alcohol   Drug use: No   Sexual activity: Not on  file  Other Topics Concern   Not on file  Social History Narrative   Not on file   Social Drivers of Health   Tobacco Use: Low Risk  (07/19/2024)   Received from [facility]   Patient History    Smoking Tobacco Use: Never    Smokeless Tobacco Use: Never    Passive Exposure: Past  Financial Resource Strain: Low Risk  (04/30/2024)   Received from [facility]   Overall Financial Resource Strain (CARDIA)    Difficulty of Paying Living Expenses: Not hard at all  Food Insecurity: No Food Insecurity (04/30/2024)   Received from [facility]   Epic    Within the past 12 months, you worried that your food would run out before you got the money to buy more.: Never true    Within the past 12 months, the food you bought just didn't last and you didn't have money to get more.: Never true  Transportation Needs: Unknown (04/30/2024)   Received from [facility]   PRAPARE - Transportation    In the past 12 months, has lack of transportation kept you from medical appointments or from getting medications?: No    Lack of Transportation (Non-Medical): Not on file  Physical Activity: Not on file  Stress: Not on file  Social Connections: Not on file  Intimate Partner Violence: Not At Risk (09/24/2022)   Humiliation, Afraid, Rape, and Kick questionnaire    Fear of Current or Ex-Partner: No    Emotionally Abused: No    Physically Abused: No    Sexually Abused: No  Depression (PHQ2-9): Low Risk (07/12/2024)   Depression (PHQ2-9)    PHQ-2 Score: 0  Alcohol Screen: Not on file  Housing: Low Risk  (04/30/2024)   Received from [facility]   Epic    In the last 12 months, was there a time when you were not able to pay the mortgage or rent on time?: No    In the past 12 months, how many times have you moved where you were living?: 0    At any time in the past 12 months, were you homeless or living in a shelter (including  now)?: No  Utilities: Not At Risk (04/30/2024)   Received from [facility]   Epic    In the past 12 months has the electric, gas, oil, or water company threatened to shut off services in your home?: No  Health Literacy: Not on file   Family History: No family history on file.  Review of Systems: Constitutional: Doesn't report fevers, chills or abnormal weight loss Eyes: Doesn't report blurriness of vision Ears, nose, mouth, throat, and face: Doesn't report sore throat Respiratory: Doesn't report cough, dyspnea or wheezes Cardiovascular: Doesn't report palpitation,  chest discomfort  Gastrointestinal:  Doesn't report nausea, constipation, diarrhea GU: Doesn't report incontinence Skin: Doesn't report skin rashes Neurological: Per HPI Musculoskeletal: Doesn't report joint pain Behavioral/Psych: Doesn't report anxiety  Physical Exam: Vitals:   08/23/24 0954  BP: (!) 144/89  Pulse: 81  Resp: 18  Temp: 97.8 F (36.6 C)  SpO2: 100%    KPS: 70. General: Alert, cooperative, pleasant, in no acute distress Head: Craniotomy scar EENT: No conjunctival injection or scleral icterus.  Lungs: Resp effort normal Cardiac: Regular rate Abdomen: Non-distended abdomen Skin: No rashes cyanosis or petechiae. Extremities: No clubbing or edema  Neurologic Exam: Mental Status: Awake, alert, attentive to examiner. Oriented to self and environment. Language is modestly impaired with regards to fluency.  Age advanced pyschomotor slowing Cranial Nerves: Visual acuity is grossly normal. Visual fields are full. Extra-ocular movements intact. No ptosis. Face is symmetric Motor: Tone and bulk are normal. Power is full in both arms and legs. Reflexes are symmetric, no pathologic reflexes present.  Sensory: Intact to light touch Gait: Dystaxic   Labs: I have reviewed the data as listed    Component Value Date/Time   NA 139 08/20/2024 1514   NA 141 10/28/2022 0946   NA 140  03/28/2015 0957   K 4.1 08/20/2024 1514   K 3.9 03/28/2015 0957   CL 102 08/20/2024 1514   CL 104 03/28/2015 0957   CO2 26 08/20/2024 1514   CO2 26 03/28/2015 0957   GLUCOSE 90 08/20/2024 1514   GLUCOSE 124 (H) 03/28/2015 0957   BUN 18 08/20/2024 1514   BUN 16 10/28/2022 0946   BUN 14 03/28/2015 0957   CREATININE 1.14 08/20/2024 1514   CREATININE 1.1 03/28/2015 0957   CALCIUM 10.0 08/20/2024 1514   CALCIUM 9.0 03/28/2015 0957   PROT 7.6 08/20/2024 1514   PROT 6.9 10/28/2022 0946   PROT 7.2 03/28/2015 0957   ALBUMIN 4.7 08/20/2024 1514   ALBUMIN 4.5 10/28/2022 0946   AST 27 08/20/2024 1514   ALT 27 08/20/2024 1514   ALT 27 03/28/2015 0957   ALKPHOS 98 08/20/2024 1514   ALKPHOS 81 03/28/2015 0957   BILITOT 0.3 08/20/2024 1514   GFRNONAA >60 08/20/2024 1514   GFRAA >90 09/05/2011 0402   Lab Results  Component Value Date   WBC 6.6 08/20/2024   NEUTROABS 4.4 08/20/2024   HGB 15.4 08/20/2024   HCT 45.2 08/20/2024   MCV 87.9 08/20/2024   PLT 106 (L) 08/20/2024     Assessment/Plan Astrocytoma (HCC)  Seizure (HCC)  Dawsen Krieger presents today, now amidst cycle #2 CCNU day 29/42, following re-irradiation therapy on 06/04/24.  Labs demonstrate recurrence of thrombocytopenia today, seen also during initial CCNU cycle.  MRI at [facility] currently scheduled for 09/07/24.  Patient elected to continue with cycle #2 oral CCNU 110mg /m2 q6 weeks.  We reviewed side effects of CCNU, including fatigue, nausea vomiting, cytopenias, ILD.  We reviewed side effects of avastin, including hypertension, bleeding/clotting events, wound healing impairment.  The patient will have a complete blood count, a comprehensive metabolic panel q2 weeks. Labs may need to be performed more often. Zofran  will prescribed for home use for nausea/vomiting.   Chemotherapy should be held for the following:  ANC less than 1,000  Platelets less than 100,000  LFT or creatinine greater than 2x ULN  If clinical  concerns/contraindications develop  Should remain off decadron  if tolerated.  Will con't Keppra  (2000mg  BID ER), Vimpat  (200mg  BID) and Onfi  (10mg  daily) at this time.  May con't zoloft  25mg  daily for depression symptoms.    We ask that Brent Taillon return to clinic in 3 weeks following cycle #2, or sooner as needed.  Labs will also be drawn in 2 weeks, although due to travel he may ask [facility] team to perform labs on 12/30.  All questions were answered. The patient knows to call the clinic with any problems, questions or concerns. No barriers to learning were detected.  The total time spent in the encounter was 30 minutes and more than 50% was on counseling and review of test results   Arthea MARLA Manns, MD Medical Director of Neuro-Oncology [facility] 08/23/2024 10:02 AM

## 2024-08-23 NOTE — Patient Instructions (Signed)
°  SLP provided pt with some worksheets to do at home

## 2024-08-24 ENCOUNTER — Other Ambulatory Visit: Payer: Self-pay

## 2024-08-24 NOTE — Therapy (Unsigned)
 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   Depression    Low testosterone  03/30/2013   Radiation 11/04/11-12/19/11   Left temporal brain 59.4 gray in 33 fractions   Skin cancer 09/09/1986   Past Surgical History:  Procedure Laterality Date   BACK SURGERY  09/09/1994   ruptured disc   CRANIOTOMY  09/04/2011   Procedure: CRANIOTOMY TUMOR EXCISION;  Surgeon: Catalina CHRISTELLA Stains;  Location: [facility];  Service: Neurosurgery;  Laterality: Left;  Left Temporal craniectomy for tumor   MASS BIOPSY  09/05/2011   left temporal lobe   skin cancer removal  1988   Removed skin cancer to left chest   Patient Active Problem List   Diagnosis Date Noted   Stroke-like symptoms 09/22/2022   Seizure (HCC) 09/22/2022   Low testosterone  03/30/2013   Depression    Skin cancer    Radiation    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  Pt accompanied by: significant other  PERTINENT HISTORY: Pt has had a course of home health ST between 05-10-24 and  today. VASLOW-07/12/24: 04/20/2021 Initial Diagnosis Atypical meningioma of brain (CMS-HCC)  04/20/2021 Surgery Resection of left frontal convexity mass with Peggye Li. Pathology: atypical meningioma, WHO grade II. TERT negative.  05/31/2021 - 07/16/2021 Radiation IMRT to the left frontal convexity resected typical meningioma total dose 57.6 Gy  07/31/2021 Radiology Study MRI post radiation for left frontal meningioma and for left temporal oligoastrocytoma surveillance. These two sites are stable. Small enhancing focus medial to left temporal resection cavity is apparent on today's scan comparable to July 2022. Will follow up with a short term MRI in another 6 weeks.   11/2022 - Chemotherapy  Tibsovo     04/29/24: Nodular progression left temporal; undergoes craniotomy, resection with Dr. Li.  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     06/04/24: Completes 25Gy/5 to resection site with Dr. 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 An MRI in October showed reduced inflammation and tumor size. He experiences transcortical motor aphasia, with increased difficulty in the afternoon, and a visual field defect with right superior quadrantanopia. No new seizures have occurred since surgery, indicating effective tumor control.  Continue the current antiepileptic regimen and speech therapy.   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 The current antiepileptic regimen includes Lacosamide , Levetiracetam , Clonazepam, and Clobazam , with some noted  fatigue but no significant side effects. Seizure control is prioritized over potential medication side effects. Continue the current antiepileptic regimen and monitor for seizure recurrence. Consider weaning medications if seizure-free for six months.   Transcortical motor aphasia   Post-surgical language difficulties are characterized by transcortical motor aphasia, with word finding and substitution issues, particularly in the afternoon. Speech therapy is ongoing. Continue speech therapy.   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   Gait disturbance   A slightly broad-based stooped gait is more pronounced with increased activity, likely related to post-surgical changes. No concerning features like Parkinsonism are present. Continue to monitor gait changes.  PAIN:  Are you having pain? No  FALLS: Has patient fallen in last 6 months?  No   PATIENT GOALS: Improve to the point of being able to work  OBJECTIVE:  Note: Objective measures were completed at Evaluation unless otherwise noted.   ORAL MOTOR EXAMINATION: Overall status: Did not assess due to time - will assess next session  STANDARDIZED ASSESSMENTS: B[place]ON NAMING TEST: (BNT-2): Pt scored 15/30 with odds presentation of BNT-2 which is >2 standard deviations below the mean.         QUICK APHASIA BATTERY - 08/25/24 Summary      Word comprehension 10.00  Sentence comprehension 7.92  Word finding 5.75  Grammatical construction 5.50  Speech motor programming 10.00  Repetition 7.92  Reading 8.75  QAB overall 7.50   Pt's performance with this assessment indicates a mild to moderate aphasia mainly with pt's expressive language. Strength is reading and word and sentence comprehension. PT's conversational speech includes omitted content words, some semantic paraphasias, anomia fostering intermittently successful compensations. QAB overall  score Severity 0.00-4.99  severe 5.00-7.49  moderate 7.50-8.89  mild 8.90-10.00  no aphasia   PATIENT REPORTED OUTCOME MEASURES (PROM): Communication Effectiveness Survey: will be completed in first 2 therapy sessions                                                                                                                            TREATMENT DATE:   08/25/24: Needs PROM next session, and introduction to Talk Path therapy (and similar tech-based options) next session. Pt brought homework in, completed. SLP assisted pt with responsive naming task as pt's responses were not 100% the category targeted. Pt benefited from mod semantic cues, and rarely from phonemic cues. QAB completed today with pt. Scores above.in standardized assessments. SLP provided homework for pt until next session. Told him to work at least 20-30 minutes (or until mentally fatigued) as often as he likes during the day.   08/23/24: Pt needs QAB and PROM next session. 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 SLP ascertained that pt largely does not benefit from phonemic cues and shared this with Ray and [name]. Lastly, SLP talked briefly with pt and wife about past ST and tasks that SLP performed with Ray. SLP encouraged Ray to cont with those tasks at home for more practice.   PATIENT EDUCATION: Education details: see treatment date Person educated: Patient and Spouse Education method: Explanation, Demonstration, and Handouts Education comprehension: verbalized understanding and needs further education   GOALS: Goals reviewed with patient? No  SHORT TERM GOALS: Target date: 09/24/24  Pt will name 8 items in a simple-mod complex category, using trained compensations, 85% of opportunities   Baseline: Goal status: INITIAL  2.  Pt will demo  use of trained compensatory strategies in 5 minutes simple conversation x3 sessions Baseline:  Goal status: INITIAL  3.  Pt will develop a 1-minute presentation on topic of choice using trained compensations  Baseline:  Goal status: INITIAL  4.  Pt will deliver a 1-minute presentation using trained compensations Baseline:  Goal status: INITIAL  5.  Pt will demo use of speech to text for functional completion of 1-sentence text messages Baseline:  Goal status: INITIAL  6.  With trained compensations pt will complete billpay via telephone with 100% success in 2/2 opportunities Baseline:  Goal status: INITIAL  7.  Pt will complete QAB or other aphasia assessment Baseline:  Goal status: INITIAL  LONG TERM GOALS: Target date: 10/08/24  Pt will develop a 3-minute presentation about a topic of choice with mod I Baseline:  Goal status: INITIAL  2.  Pt will deliver a 3-minute presentation with mod I Baseline:  Goal status: INITIAL  3.  Pt will demo use of speech to text for functional completion of 2-sentence text messages   Baseline:  Goal status: INITIAL  4.  With trained compensations pt will complete billpay via telephone with 100% success in at least 2/2 opportunities Baseline:  Goal status: INITIAL  5.  Pt will implement trained compensations in 3 opportunities, between sessions, when he thinks he is getting anxious about making errors in verbal expression outside of ST Baseline:  Goal status: INITIAL  5.  Pt will improve PROM Baseline:  Goal status: INITIAL   ASSESSMENT:  CLINICAL IMPRESSION: Patient is a 58 y.o. M who was seen today for treatment of speech and language in light of hx of seizures and of tumor resection. See treatment date above for today's date for further details on today's session. On eval date pt was in week 5 of a 6-week oral chemo regimen following resection of lt sided brain tumor in August 2025 and radiation to the affected area. He has had  a course of HHST since that sx. Functionally, pt was forced to retire recently, and has difficulty with verbal expression so that [name] (wife) needs to listen when pt pays bills via phone, and that pt uses pronouns interchangeably at times making tracking his conversation challenging at times. Lastly, his language declines when he experiences difficulty due to anxiety about making errors. [name] indicated pt is still working through some depression regarding his current situation. 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  OBJECTIVE IMPAIRMENTS: include aphasia. These impairments are limiting patient from managing medications, managing appointments, managing finances, household responsibilities, ADLs/IADLs,  and effectively communicating at home and in community. Factors affecting potential to achieve goals and functional outcome are co-morbidities, medical prognosis, and severity of impairments. Patient will benefit from skilled SLP services to address above impairments and improve overall function.  REHAB POTENTIAL: Fair given above factors  PLAN:  SLP FREQUENCY: 2x/week  SLP DURATION: 6 weeks  PLANNED INTERVENTIONS: Language facilitation, Environmental controls, Cueing hierachy, Internal/external aids, Functional tasks, Multimodal communication approach, SLP instruction and feedback, Compensatory strategies, Patient/family education, and [id number] Treatment of speech (30 or 45 min)     Stedman [place], CCC-SLP 08/26/2024, 11:08 AM

## 2024-08-25 ENCOUNTER — Ambulatory Visit

## 2024-08-25 DIAGNOSIS — R4701 Aphasia: Secondary | ICD-10-CM | POA: Diagnosis not present

## 2024-08-30 ENCOUNTER — Ambulatory Visit

## 2024-08-30 DIAGNOSIS — R4701 Aphasia: Secondary | ICD-10-CM

## 2024-08-30 NOTE — Therapy (Signed)
 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   Depression    Low testosterone  03/30/2013   Radiation 11/04/11-12/19/11   Left temporal brain 59.4 gray in 33 fractions   Skin cancer 09/09/1986   Past Surgical History:  Procedure Laterality Date   BACK SURGERY  09/09/1994   ruptured disc   CRANIOTOMY  09/04/2011   Procedure: CRANIOTOMY TUMOR EXCISION;  Surgeon: Catalina CHRISTELLA Stains;  Location: [facility];  Service: Neurosurgery;  Laterality: Left;  Left Temporal craniectomy for tumor   MASS BIOPSY  09/05/2011   left temporal lobe   skin cancer removal  1988   Removed skin cancer to left chest   Patient Active Problem List   Diagnosis Date Noted   Stroke-like symptoms 09/22/2022   Seizure (HCC) 09/22/2022   Low testosterone  03/30/2013   Depression    Skin cancer    Radiation    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  Pt accompanied by: significant other wife  PERTINENT HISTORY: Pt has had a course of home health ST between 05-10-24 and  today. VASLOW-07/12/24: 04/20/2021 Initial Diagnosis Atypical meningioma of brain (CMS-HCC)  04/20/2021 Surgery Resection of left frontal convexity mass with Peggye Li. Pathology: atypical meningioma, WHO grade II. TERT negative.  05/31/2021 - 07/16/2021 Radiation IMRT to the left frontal convexity resected typical meningioma total dose 57.6 Gy  07/31/2021 Radiology Study MRI post radiation for left frontal meningioma and for left temporal oligoastrocytoma surveillance. These two sites are stable. Small enhancing focus medial to left temporal resection cavity is apparent on today's scan comparable to July 2022. Will follow up with a short term MRI in another 6 weeks.   11/2022 - Chemotherapy  Tibsovo     04/29/24: Nodular progression left temporal; undergoes craniotomy, resection with Dr. Li.  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     06/04/24: Completes 25Gy/5 to resection site with Dr. 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 An MRI in October showed reduced inflammation and tumor size. He experiences transcortical motor aphasia, with increased difficulty in the afternoon, and a visual field defect with right superior quadrantanopia. No new seizures have occurred since surgery, indicating effective tumor control. Continue the current antiepileptic regimen  and speech therapy.   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 The current antiepileptic regimen includes Lacosamide , Levetiracetam , Clonazepam, and Clobazam , with some noted  fatigue but no significant side effects. Seizure control is prioritized over potential medication side effects. Continue the current antiepileptic regimen and monitor for seizure recurrence. Consider weaning medications if seizure-free for six months.   Transcortical motor aphasia   Post-surgical language difficulties are characterized by transcortical motor aphasia, with word finding and substitution issues, particularly in the afternoon. Speech therapy is ongoing. Continue speech therapy.   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   Gait disturbance   A slightly broad-based stooped gait is more pronounced with increased activity, likely related to post-surgical changes. No concerning features like Parkinsonism are present. Continue to monitor gait changes.  PAIN:  Are you having pain? No  FALLS: Has patient fallen in last 6 months?  No   PATIENT GOALS: Improve to the point of being able to work  OBJECTIVE:  Note: Objective measures were completed at Evaluation unless otherwise noted.   ORAL MOTOR EXAMINATION: Overall status: Did not assess due to time - will assess next session  STANDARDIZED ASSESSMENTS: B[place] NAMING TEST: (BNT-2): Pt scored 15/30 with odds presentation of BNT-2 which is >2 standard deviations below the mean.         QUICK APHASIA BATTERY - 08/25/24 Summary      Word comprehension 10.00  Sentence comprehension 7.92  Word finding 5.75  Grammatical construction 5.50  Speech motor programming 10.00  Repetition 7.92  Reading 8.75  QAB overall 7.50   Pt's performance with this assessment indicates a mild to moderate aphasia mainly with pt's expressive language. Strength is reading and word and sentence comprehension. PT's conversational speech includes omitted content words, some semantic paraphasias, anomia fostering intermittently successful compensations. QAB overall  score Severity 0.00-4.99  severe 5.00-7.49  moderate 7.50-8.89  mild 8.90-10.00  no aphasia   PATIENT REPORTED OUTCOME MEASURES (PROM): Communication Effectiveness Survey: provided 08/30/24 to pt and asked to return next session.                                                                                                                            TREATMENT DATE:   08/30/24: SLP introduced Online/app-based therapy and [employer] for home-based therapy tasks. See pt instructions. Education occurred for pt/wife around the neurological processes that occur with practice. Pt's wife reported a challenging situation is ordering at restaurants with pt difficulty with asking questions: are these real potatoes? Is there sugar in the cornbread? Pt would order half diet, half regular Coke, and prefers a baguette with his soup. Another situation is longer and deeper conversations, and a difficulty with pronouns. SLP to address these situations in upcoming sessions.   08/25/24: Needs PROM next session, and introduction to Talk Path therapy (and similar tech-based  options) next session. Pt brought homework in, completed. SLP assisted pt with responsive naming task as pt's responses were not 100% the category targeted. Pt benefited from mod semantic cues, and rarely from phonemic cues. QAB completed today with pt. Scores above.in standardized assessments. SLP provided homework for pt until next session. Told him to work at least 20-30 minutes (or until mentally fatigued) as often as he likes during the day.   08/23/24: Pt needs QAB and PROM next session. 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 SLP ascertained that pt largely does not benefit from phonemic cues and shared this with Nathan Prince and Nathan Prince. Lastly, SLP talked briefly  with pt and wife about past ST and tasks that SLP performed with Nathan Prince. SLP encouraged Nathan Prince to cont with those tasks at home for more practice.   PATIENT EDUCATION: Education details: see treatment date Person educated: Patient and Spouse Education method: Explanation, Demonstration, and Handouts Education comprehension: verbalized understanding and needs further education   GOALS: Goals reviewed with patient? No  SHORT TERM GOALS: Target date: 09/24/24  Pt will name 8 items in a simple-mod complex category, using trained compensations, 85% of opportunities   Baseline: Goal status: INITIAL  2.  Pt will demo use of trained compensatory strategies in 5 minutes simple conversation x3 sessions Baseline:  Goal status: INITIAL  3.  Pt will develop a 1-minute presentation on topic of choice using trained compensations  Baseline:  Goal status: INITIAL  4.  Pt will deliver a 1-minute presentation using trained compensations Baseline:  Goal status: INITIAL  5.  Pt will demo use of speech to text for functional completion of 1-sentence text messages Baseline:  Goal status: INITIAL  6.  With trained compensations pt will complete billpay via telephone with 100% success in 2/2 opportunities Baseline:  Goal status: INITIAL  7.  Pt will complete QAB or other aphasia assessment Baseline:  Goal status: met  LONG TERM GOALS: Target date: 10/08/24  Pt will develop a 3-minute presentation about a topic of choice with mod I Baseline:  Goal status: INITIAL  2.  Pt will deliver a 3-minute presentation with mod I Baseline:  Goal status: INITIAL  3.  Pt will demo use of speech to text for functional completion of 2-sentence text messages   Baseline:  Goal status: INITIAL  4.  With trained compensations pt will complete billpay via telephone with 100% success in at least 2/2 opportunities Baseline:  Goal status: INITIAL  5.  Pt will implement trained compensations in 3 opportunities,  between sessions, when he thinks he is getting anxious about making errors in verbal expression outside of ST Baseline:  Goal status: INITIAL  5.  Pt will improve PROM Baseline:  Goal status: INITIAL   ASSESSMENT:  CLINICAL IMPRESSION: Patient is a 58 y.o. M who was seen today for treatment of speech and language in light of hx of seizures and of tumor resection. See treatment date above for today's date for further details on today's session. On eval date pt was in week 5 of a 6-week oral chemo regimen following resection of lt sided brain tumor in August 2025 and radiation to the affected area. He has had a course of HHST since that sx. Functionally, pt was forced to retire recently, and has difficulty with verbal expression so that Nathan Prince (  wife) needs to listen when pt pays bills via phone, and that pt uses pronouns interchangeably at times making tracking his conversation challenging at times. Lastly, his language declines when he experiences difficulty due to anxiety about making errors. Nathan Prince indicated pt is still working through some depression regarding his current situation. 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  OBJECTIVE IMPAIRMENTS: include aphasia. These impairments are limiting patient from managing medications, managing appointments, managing finances, household responsibilities, ADLs/IADLs, and effectively communicating at home and in community. Factors affecting potential to achieve goals and functional outcome are co-morbidities, medical prognosis, and severity of impairments. Patient will benefit from skilled SLP services to address above impairments and improve overall function.  REHAB POTENTIAL: Fair given above factors  PLAN:  SLP FREQUENCY: 2x/week  SLP DURATION: 6 weeks  PLANNED INTERVENTIONS: Language facilitation, Environmental controls, Cueing hierachy, Internal/external aids, Functional tasks, Multimodal  communication approach, SLP instruction and feedback, Compensatory strategies, Patient/family education, and [id number] Treatment of speech (30 or 45 min)     Hermon Zea, CCC-SLP 08/30/2024, 10:49 PM      "

## 2024-08-30 NOTE — Patient Instructions (Addendum)
" °  TalkPath Therapy [email or web address]  App - [employer] (ioS, android) Free for 14 days; After that there is a subscription - $29.99/month, and you can try [email or web address] for 15% off subscription rate  [facility] Once at [facility], once is virtual  [employer] [email or web address] "

## 2024-08-31 ENCOUNTER — Other Ambulatory Visit: Payer: Self-pay | Admitting: *Deleted

## 2024-08-31 DIAGNOSIS — C719 Malignant neoplasm of brain, unspecified: Secondary | ICD-10-CM

## 2024-09-06 ENCOUNTER — Ambulatory Visit

## 2024-09-06 ENCOUNTER — Inpatient Hospital Stay

## 2024-09-06 DIAGNOSIS — C719 Malignant neoplasm of brain, unspecified: Secondary | ICD-10-CM

## 2024-09-06 DIAGNOSIS — R4701 Aphasia: Secondary | ICD-10-CM | POA: Diagnosis not present

## 2024-09-06 DIAGNOSIS — C712 Malignant neoplasm of temporal lobe: Secondary | ICD-10-CM | POA: Diagnosis not present

## 2024-09-06 LAB — CBC WITH DIFFERENTIAL (CANCER CENTER ONLY)
Abs Immature Granulocytes: 0 K/uL (ref 0.00–0.07)
Basophils Absolute: 0 K/uL (ref 0.0–0.1)
Basophils Relative: 0 %
Eosinophils Absolute: 0.1 K/uL (ref 0.0–0.5)
Eosinophils Relative: 2 %
HCT: 43.4 % (ref 39.0–52.0)
Hemoglobin: 14.8 g/dL (ref 13.0–17.0)
Immature Granulocytes: 0 %
Lymphocytes Relative: 18 %
Lymphs Abs: 0.9 K/uL (ref 0.7–4.0)
MCH: 30.6 pg (ref 26.0–34.0)
MCHC: 34.1 g/dL (ref 30.0–36.0)
MCV: 89.9 fL (ref 80.0–100.0)
Monocytes Absolute: 0.4 K/uL (ref 0.1–1.0)
Monocytes Relative: 9 %
Neutro Abs: 3.5 K/uL (ref 1.7–7.7)
Neutrophils Relative %: 71 %
Platelet Count: 160 K/uL (ref 150–400)
RBC: 4.83 MIL/uL (ref 4.22–5.81)
RDW: 15.6 % — ABNORMAL HIGH (ref 11.5–15.5)
WBC Count: 4.9 K/uL (ref 4.0–10.5)
nRBC: 0 % (ref 0.0–0.2)

## 2024-09-06 LAB — CMP (CANCER CENTER ONLY)
ALT: 19 U/L (ref 0–44)
AST: 24 U/L (ref 15–41)
Albumin: 4.5 g/dL (ref 3.5–5.0)
Alkaline Phosphatase: 96 U/L (ref 38–126)
Anion gap: 12 (ref 5–15)
BUN: 17 mg/dL (ref 6–20)
CO2: 24 mmol/L (ref 22–32)
Calcium: 9.8 mg/dL (ref 8.9–10.3)
Chloride: 106 mmol/L (ref 98–111)
Creatinine: 1.14 mg/dL (ref 0.61–1.24)
GFR, Estimated: >60 mL/min
Glucose, Bld: 90 mg/dL (ref 70–99)
Potassium: 4.1 mmol/L (ref 3.5–5.1)
Sodium: 141 mmol/L (ref 135–145)
Total Bilirubin: 0.3 mg/dL (ref 0.0–1.2)
Total Protein: 7.3 g/dL (ref 6.5–8.1)

## 2024-09-06 NOTE — Therapy (Signed)
 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   Depression    Low testosterone  03/30/2013   Radiation 11/04/11-12/19/11   Left temporal brain 59.4 gray in 33 fractions   Skin cancer 09/09/1986   Past Surgical History:  Procedure Laterality Date   BACK SURGERY  09/09/1994   ruptured disc   CRANIOTOMY  09/04/2011   Procedure: CRANIOTOMY TUMOR EXCISION;  Surgeon: Catalina CHRISTELLA Stains;  Location: [facility];  Service: Neurosurgery;  Laterality: Left;  Left Temporal craniectomy for tumor   MASS BIOPSY  09/05/2011   left temporal lobe   skin cancer removal  1988   Removed skin cancer to left chest   Patient Active Problem List   Diagnosis Date Noted   Stroke-like symptoms 09/22/2022   Seizure (HCC) 09/22/2022   Low testosterone  03/30/2013   Depression    Skin cancer    Radiation    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  Pt accompanied by: significant other wife  PERTINENT HISTORY: Pt has had a course of home health ST between 05-10-24 and today. VASLOW-07/12/24: 04/20/2021  Initial Diagnosis Atypical meningioma of brain (CMS-HCC)  04/20/2021 Surgery Resection of left frontal convexity mass with Peggye Li. Pathology: atypical meningioma, WHO grade II. TERT negative.  05/31/2021 - 07/16/2021 Radiation IMRT to the left frontal convexity resected typical meningioma total dose 57.6 Gy  07/31/2021 Radiology Study MRI post radiation for left frontal meningioma and for left temporal oligoastrocytoma surveillance. These two sites are stable. Small enhancing focus medial to left temporal resection cavity is apparent on today's scan comparable to July 2022. Will follow up with a short term MRI in another 6 weeks.   11/2022 - Chemotherapy  Tibsovo     04/29/24: Nodular progression left temporal; undergoes craniotomy, resection with Dr. Li.  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     06/04/24: Completes 25Gy/5 to resection site with Dr. 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 An MRI in October showed reduced inflammation and tumor size. He experiences transcortical motor aphasia, with increased difficulty in the afternoon, and a visual field defect with right superior quadrantanopia. No new seizures have occurred since surgery, indicating effective tumor control. Continue the current  antiepileptic regimen and speech therapy.   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 The current antiepileptic regimen includes Lacosamide , Levetiracetam , Clonazepam, and Clobazam , with some noted fatigue but no significant side  effects. Seizure control is prioritized over potential medication side effects. Continue the current antiepileptic regimen and monitor for seizure recurrence. Consider weaning medications if seizure-free for six months.   Transcortical motor aphasia   Post-surgical language difficulties are characterized by transcortical motor aphasia, with word finding and substitution issues, particularly in the afternoon. Speech therapy is ongoing. Continue speech therapy.   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   Gait disturbance   A slightly broad-based stooped gait is more pronounced with increased activity, likely related to post-surgical changes. No concerning features like Parkinsonism are present. Continue to monitor gait changes.  PAIN:  Are you having pain? No  FALLS: Has patient fallen in last 6 months?  No   PATIENT GOALS: Improve to the point of being able to work  OBJECTIVE:  Note: Objective measures were completed at Evaluation unless otherwise noted.   ORAL MOTOR EXAMINATION: Overall status: Did not assess due to time - will assess next session  STANDARDIZED ASSESSMENTS: BOSTON NAMING TEST: (BNT-2): Pt scored 15/30 with odds presentation of BNT-2 which is >2 standard deviations below the mean.         QUICK APHASIA BATTERY - 08/25/24 Summary      Word comprehension 10.00  Sentence comprehension 7.92  Word finding 5.75  Grammatical construction 5.50  Speech motor programming 10.00  Repetition 7.92  Reading 8.75  QAB overall 7.50   Pt's performance with this assessment indicates a mild to moderate aphasia mainly with pt's expressive language. Strength is reading and word and sentence comprehension. PT's conversational speech includes omitted content words, some semantic paraphasias, anomia fostering intermittently successful compensations. QAB overall  score Severity 0.00-4.99  severe 5.00-7.49  moderate 7.50-8.89  mild 8.90-10.00  no aphasia   PATIENT REPORTED OUTCOME MEASURES (PROM): Communication Effectiveness Survey: provided 08/30/24 to pt and asked to return next session.                                                                                                                            TREATMENT DATE:   09/06/24: PROM not brought back. SLP provided another copy for [name] and pt to fill out and return next session. Based on pt/wife report last session, SLP worked with pt to develop scripts for [employer], and a baguette. SLP discussed with pt/wife how these could be helpful for more than just at restaurants - SLP suggested pt and [name] develop these on 3x5 cards to practice and take with him.[name] demonstrated understanding how to develop cards/scripts with pt today.   08/30/24: SLP introduced Online/app-based therapy and [employer] for home-based therapy tasks. See pt instructions. Education occurred for pt/wife around  the neurological processes that occur with practice. Pt's wife reported a challenging situation is ordering at restaurants with pt difficulty with asking questions: are these real potatoes? Is there sugar in the cornbread? Pt would order half diet, half regular Coke, and prefers a baguette with his soup. Another situation is longer and deeper conversations, and a difficulty with pronouns. SLP to address these situations in upcoming sessions.   08/25/24: Needs PROM next session, and introduction to Talk [employer] therapy (and similar tech-based options) next session. Pt brought homework in, completed. SLP assisted pt with responsive naming task as pt's responses were not 100% the category targeted. Pt benefited from mod semantic cues, and rarely from phonemic cues. QAB completed today with pt. Scores above.in standardized assessments. SLP provided homework for pt until next session. Told him to work at  least 20-30 minutes (or until mentally fatigued) as often as he likes during the day.   08/23/24: Pt needs QAB and PROM next session. 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 SLP ascertained that pt largely does not benefit from phonemic cues and shared this with Ray and [name]. Lastly, SLP talked briefly with pt and wife about past ST and tasks that SLP performed with Ray. SLP encouraged Ray to cont with those tasks at home for more practice.   PATIENT EDUCATION: Education details: see treatment date Person educated: Patient and Spouse Education method: Explanation, Demonstration, and Handouts Education comprehension: verbalized understanding and needs further education   GOALS: Goals reviewed with patient? No  SHORT TERM GOALS: Target date: 09/24/24  Pt will name 8 items in a simple-mod complex category, using trained compensations, 85% of opportunities   Baseline: Goal status: INITIAL  2.  Pt will demo use of trained compensatory strategies in 5 minutes simple conversation x3 sessions Baseline:  Goal status: INITIAL  3.  Pt will develop a 1-minute presentation on topic of choice using trained compensations  Baseline:  Goal status: INITIAL  4.  Pt will deliver a 1-minute presentation using trained compensations Baseline:  Goal status: INITIAL  5.  Pt will demo use of speech to text for functional completion of 1-sentence text messages Baseline:  Goal status: INITIAL  6.  With trained compensations pt will complete billpay via telephone with 100% success in 2/2 opportunities Baseline:  Goal status: INITIAL  7.  Pt will complete QAB or other aphasia assessment Baseline:  Goal status: met  LONG TERM GOALS: Target date: 10/08/24  Pt will develop a 3-minute presentation about a topic of choice with mod  I Baseline:  Goal status: INITIAL  2.  Pt will deliver a 3-minute presentation with mod I Baseline:  Goal status: INITIAL  3.  Pt will demo use of speech to text for functional completion of 2-sentence text messages   Baseline:  Goal status: INITIAL  4.  With trained compensations pt will complete billpay via telephone with 100% success in at least 2/2 opportunities Baseline:  Goal status: INITIAL  5.  Pt will implement trained compensations in 3 opportunities, between sessions, when he thinks he is getting anxious about making errors in verbal expression outside of ST Baseline:  Goal status: INITIAL  5.  Pt will improve PROM Baseline:  Goal status: INITIAL   ASSESSMENT:  CLINICAL IMPRESSION: Patient is a 58 y.o. M who was seen today for  treatment of speech and language in light of hx of seizures and of tumor resection. See treatment date above for today's date for further details on today's session. On eval date pt was in week 5 of a 6-week oral chemo regimen following resection of lt sided brain tumor in August 2025 and radiation to the affected area. He has had a course of HHST since that sx. Functionally, pt was forced to retire recently, and has difficulty with verbal expression so that [name] (wife) needs to listen when pt pays bills via phone, and that pt uses pronouns interchangeably at times making tracking his conversation challenging at times. Lastly, his language declines when he experiences difficulty due to anxiety about making errors. [name] indicated pt is still working through some depression regarding his current situation. 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  OBJECTIVE IMPAIRMENTS: include aphasia. These impairments are limiting patient from managing medications, managing appointments, managing finances, household responsibilities, ADLs/IADLs, and effectively communicating at home and in community. Factors  affecting potential to achieve goals and functional outcome are co-morbidities, medical prognosis, and severity of impairments. Patient will benefit from skilled SLP services to address above impairments and improve overall function.  REHAB POTENTIAL: Fair given above factors  PLAN:  SLP FREQUENCY: 2x/week  SLP DURATION: 6 weeks  PLANNED INTERVENTIONS: Language facilitation, Environmental controls, Cueing hierachy, Internal/external aids, Functional tasks, Multimodal communication approach, SLP instruction and feedback, Compensatory strategies, Patient/family education, and [id number] Treatment of speech (30 or 45 min)     Cait Locust, CCC-SLP 09/06/2024, 8:51 AM      "

## 2024-09-07 ENCOUNTER — Other Ambulatory Visit: Payer: Self-pay

## 2024-09-08 ENCOUNTER — Ambulatory Visit

## 2024-09-08 DIAGNOSIS — R4701 Aphasia: Secondary | ICD-10-CM | POA: Diagnosis not present

## 2024-09-08 NOTE — Therapy (Signed)
 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   Depression    Low testosterone  03/30/2013   Radiation 11/04/11-12/19/11   Left temporal brain 59.4 gray in 33 fractions   Skin cancer 09/09/1986   Past Surgical History:  Procedure Laterality Date   BACK SURGERY  09/09/1994   ruptured disc   CRANIOTOMY  09/04/2011   Procedure: CRANIOTOMY TUMOR EXCISION;  Surgeon: Catalina CHRISTELLA Stains;  Location: [facility];  Service: Neurosurgery;  Laterality: Left;  Left Temporal craniectomy for tumor   MASS BIOPSY  09/05/2011   left temporal lobe   skin cancer removal  1988   Removed skin cancer to left chest   Patient Active Problem List   Diagnosis Date Noted   Stroke-like symptoms 09/22/2022   Seizure (HCC) 09/22/2022   Low testosterone  03/30/2013   Depression    Skin cancer    Radiation    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 (Pt after wife told Ray to tell SLP what happened yesterday, with cue)  Pt accompanied by: significant other wife Nathan Prince  PERTINENT HISTORY: Pt has had a course of home health  ST between 05-10-24 and today. VASLOW-07/12/24: 04/20/2021 Initial Diagnosis Atypical meningioma of brain (CMS-HCC)  04/20/2021 Surgery Resection of left frontal convexity mass with Peggye Li. Pathology: atypical meningioma, WHO grade II. TERT negative.  05/31/2021 - 07/16/2021 Radiation IMRT to the left frontal convexity resected typical meningioma total dose 57.6 Gy  07/31/2021 Radiology Study MRI post radiation for left frontal meningioma and for left temporal oligoastrocytoma surveillance. These two sites are stable. Small enhancing focus medial to left temporal resection cavity is apparent on today's scan comparable to July 2022. Will follow up with a short term MRI in another 6 weeks.   11/2022 - Chemotherapy  Tibsovo     04/29/24: Nodular progression left temporal; undergoes craniotomy, resection with Dr. Li.  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     06/04/24: Completes 25Gy/5 to resection site with Dr. 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 An MRI in October showed reduced inflammation and tumor size. He experiences transcortical motor aphasia, with increased difficulty in the afternoon, and a visual field defect with right superior quadrantanopia. No new seizures  have occurred since surgery, indicating effective tumor control. Continue the current antiepileptic regimen and speech therapy.   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 The current antiepileptic regimen includes Lacosamide , Levetiracetam , Clonazepam, and  Clobazam , with some noted fatigue but no significant side effects. Seizure control is prioritized over potential medication side effects. Continue the current antiepileptic regimen and monitor for seizure recurrence. Consider weaning medications if seizure-free for six months.   Transcortical motor aphasia   Post-surgical language difficulties are characterized by transcortical motor aphasia, with word finding and substitution issues, particularly in the afternoon. Speech therapy is ongoing. Continue speech therapy.   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   Gait disturbance   A slightly broad-based stooped gait is more pronounced with increased activity, likely related to post-surgical changes. No concerning features like Parkinsonism are present. Continue to monitor gait changes.  PAIN:  Are you having pain? No  FALLS: Has patient fallen in last 6 months?  No   PATIENT GOALS: Improve to the point of being able to work  OBJECTIVE:  Note: Objective measures were completed at Evaluation unless otherwise noted.   ORAL MOTOR EXAMINATION: Overall status: Did not assess due to time - will assess next session  STANDARDIZED ASSESSMENTS: BOSTON NAMING TEST: (BNT-2): Pt scored 15/30 with odds presentation of BNT-2 which is >2 standard deviations below the mean.         QUICK APHASIA BATTERY - 08/25/24 Summary      Word comprehension 10.00  Sentence comprehension 7.92  Word finding 5.75  Grammatical construction 5.50  Speech motor programming 10.00  Repetition 7.92  Reading 8.75  QAB overall 7.50   Pt's performance with this assessment indicates a mild to moderate aphasia mainly with pt's expressive language. Strength is reading and word and sentence comprehension. PT's conversational speech includes omitted content words, some semantic paraphasias, anomia fostering intermittently successful compensations. QAB overall  score Severity 0.00-4.99  severe 5.00-7.49  moderate 7.50-8.89  mild 8.90-10.00  no aphasia   PATIENT REPORTED OUTCOME MEASURES (PROM): Communication Effectiveness Survey: provided 08/30/24 to pt and asked to return next session.                                                                                                                            TREATMENT DATE:   09/08/24: PROM to be brought back next session. Today SLP worked with pt to develop word finding and accuracy with verbal expression with divergent naming task. With mod complex categories pt able to generate average 6 items without cues. Pt noted to have more difficulty with spelling after 20 minutes. SLP educated pt/Karen about work-break cycle. Homework to write at least 10 2-sentence facts about the [employer] revolution that SLP might not know.    09/06/24: PROM not brought back. SLP provided another copy for Nathan Prince and pt to fill out  and return next session. Based on pt/wife report last session, SLP worked with pt to develop scripts for [employer], and a baguette. SLP discussed with pt/wife how these could be helpful for more than just at restaurants - SLP suggested pt and Nathan Prince develop these on 3x5 cards to practice and take with him.Nathan Prince demonstrated understanding how to develop cards/scripts with pt today.   08/30/24: SLP introduced Online/app-based therapy and [employer] for home-based therapy tasks. See pt instructions. Education occurred for pt/wife around the neurological processes that occur with practice. Pt's wife reported a challenging situation is ordering at restaurants with pt difficulty with asking questions: are these real potatoes? Is there sugar in the cornbread? Pt would order half diet, half regular Coke, and prefers a baguette with his soup. Another situation is longer and deeper conversations, and a difficulty with pronouns. SLP to address these situations in upcoming sessions.   08/25/24:  Needs PROM next session, and introduction to Talk Path therapy (and similar tech-based options) next session. Pt brought homework in, completed. SLP assisted pt with responsive naming task as pt's responses were not 100% the category targeted. Pt benefited from mod semantic cues, and rarely from phonemic cues. QAB completed today with pt. Scores above.in standardized assessments. SLP provided homework for pt until next session. Told him to work at least 20-30 minutes (or until mentally fatigued) as often as he likes during the day.   08/23/24: Pt needs QAB and PROM next session. 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 SLP ascertained that pt largely does not benefit from phonemic cues and shared this with Ray and Nathan Prince. Lastly, SLP talked briefly with pt and wife about past ST and tasks that SLP performed with Ray. SLP encouraged Ray to cont with those tasks at home for more practice.   PATIENT EDUCATION: Education details: see treatment date Person educated: Patient and Spouse Education method: Explanation, Demonstration, and Handouts Education comprehension: verbalized understanding and needs further education   GOALS: Goals reviewed with patient? No  SHORT TERM GOALS: Target date: 09/24/24  Pt will name 8 items in a simple-mod complex category, using trained compensations, 85% of opportunities   Baseline: Goal status: INITIAL  2.  Pt will demo use of trained compensatory strategies in 5 minutes simple conversation x3 sessions Baseline:  Goal status: INITIAL  3.  Pt will develop a 1-minute presentation on topic of choice using trained compensations  Baseline:  Goal status: INITIAL  4.  Pt will deliver a 1-minute presentation using trained compensations Baseline:  Goal status: INITIAL  5.  Pt will demo use of  speech to text for functional completion of 1-sentence text messages Baseline:  Goal status: INITIAL  6.  With trained compensations pt will complete billpay via telephone with 100% success in 2/2 opportunities Baseline:  Goal status: INITIAL  7.  Pt will complete QAB or other aphasia assessment Baseline:  Goal status: met  LONG TERM GOALS: Target date: 10/08/24  Pt will develop a 3-minute presentation about a topic of choice with mod I Baseline:  Goal status: INITIAL  2.  Pt will deliver a 3-minute presentation with mod I Baseline:  Goal status: INITIAL  3.  Pt will demo use of speech to text for functional completion of 2-sentence text messages   Baseline:  Goal status: INITIAL  4.  With trained compensations pt will complete billpay via telephone with 100% success in at least 2/2 opportunities Baseline:  Goal status: INITIAL  5.  Pt will implement trained compensations in 3 opportunities, between sessions, when he thinks he is getting anxious about making errors in verbal expression outside of ST Baseline:  Goal status: INITIAL  5.  Pt will improve PROM Baseline:  Goal status: INITIAL   ASSESSMENT:  CLINICAL IMPRESSION: Patient is a 58 y.o. M who was seen today for treatment of speech and language in light of hx of seizures and of tumor resection. See treatment date above for today's date for further details on today's session. Pt's next oral chemo will be 09/17/24. Lt-sided brain tumor resected in August 2025 with radiation completed to the affected area. He has had a course of HHST since that sx. Functionally, pt was forced to retire recently, and has difficulty with verbal expression so that Nathan Prince (wife) needs to listen when pt pays bills via phone, and that pt uses pronouns interchangeably at times making tracking his conversation challenging at times. Lastly, his language declines when he experiences difficulty due to anxiety about making errors. Nathan Prince indicated pt is  still working through some depression regarding his current situation. 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  OBJECTIVE IMPAIRMENTS: include aphasia. These impairments are limiting patient from managing medications, managing appointments, managing finances, household responsibilities, ADLs/IADLs, and effectively communicating at home and in community. Factors affecting potential to achieve goals and functional outcome are co-morbidities, medical prognosis, and severity of impairments. Patient will benefit from skilled SLP services to address above impairments and improve overall function.  REHAB POTENTIAL: Fair given above factors  PLAN:  SLP FREQUENCY: 2x/week  SLP DURATION: 6 weeks  PLANNED INTERVENTIONS: Language facilitation, Environmental controls, Cueing hierachy, Internal/external aids, Functional tasks, Multimodal communication approach, SLP instruction and feedback, Compensatory strategies, Patient/family education, and [id number] Treatment of speech (30 or 45 min)     Aubrei Bouchie, CCC-SLP 09/08/2024, 3:45 PM      "

## 2024-09-10 ENCOUNTER — Encounter: Payer: Self-pay | Admitting: *Deleted

## 2024-09-10 ENCOUNTER — Inpatient Hospital Stay
Admission: RE | Admit: 2024-09-10 | Discharge: 2024-09-10 | Disposition: A | Discharge status: Home / Self Care | Payer: Self-pay | Source: Ambulatory Visit | Attending: Internal Medicine | Admitting: Internal Medicine

## 2024-09-10 ENCOUNTER — Telehealth: Payer: Self-pay | Admitting: *Deleted

## 2024-09-10 ENCOUNTER — Other Ambulatory Visit: Payer: Self-pay | Admitting: *Deleted

## 2024-09-10 DIAGNOSIS — C719 Malignant neoplasm of brain, unspecified: Secondary | ICD-10-CM

## 2024-09-10 NOTE — Progress Notes (Signed)
 Requested images from MRI brain done at [facility] on 09/07/24 be pushed to [facility].  Email to [employer] to link images to outside films order.

## 2024-09-10 NOTE — Telephone Encounter (Signed)
 Received call from pt's spouse, [name]. She is requesting that her husband's lab appt on 09/13/24 be cancelled as he just had labs done on 09/06/24. Advised that those results are available to us  and can be seen in his chart. Advised that if Dr. Buckley needed any other labs prior to Ray's next cycle of chemo, we could get it after the visit with Dr. Buckley on 09/13/24. [name] voiced understanding. Lab appt cancelled for 09/13/24

## 2024-09-13 ENCOUNTER — Other Ambulatory Visit: Payer: Self-pay

## 2024-09-13 ENCOUNTER — Other Ambulatory Visit (HOSPITAL_COMMUNITY): Payer: Self-pay

## 2024-09-13 ENCOUNTER — Inpatient Hospital Stay: Attending: Internal Medicine | Admitting: Internal Medicine

## 2024-09-13 ENCOUNTER — Inpatient Hospital Stay: Attending: Internal Medicine

## 2024-09-13 VITALS — BP 121/90 | HR 88 | Temp 97.7°F | Resp 17 | Ht 70.0 in | Wt 226.0 lb

## 2024-09-13 DIAGNOSIS — R569 Unspecified convulsions: Secondary | ICD-10-CM | POA: Diagnosis not present

## 2024-09-13 DIAGNOSIS — C719 Malignant neoplasm of brain, unspecified: Secondary | ICD-10-CM

## 2024-09-13 MED ORDER — LOMUSTINE 40 MG PO CAPS
40.0000 mg | ORAL_CAPSULE | Freq: Once | ORAL | 0 refills | Status: AC
  Filled 2024-09-14: qty 1, 42d supply, fill #0
  Filled 2024-09-14: qty 1, 1d supply, fill #0

## 2024-09-13 MED ORDER — LOMUSTINE 10 MG PO CAPS
10.0000 mg | ORAL_CAPSULE | Freq: Once | ORAL | 0 refills | Status: AC
  Filled 2024-09-13: qty 1, 1d supply, fill #0
  Filled 2024-09-14: qty 1, 42d supply, fill #0

## 2024-09-13 MED ORDER — LOMUSTINE 100 MG PO CAPS
200.0000 mg | ORAL_CAPSULE | Freq: Once | ORAL | 0 refills | Status: AC
  Filled 2024-09-14: qty 2, 42d supply, fill #0
  Filled 2024-09-14: qty 2, 1d supply, fill #0

## 2024-09-13 NOTE — Progress Notes (Signed)
 "  [facility] [address]  [place], [place] [address] [phone]   Interval Evaluation  Date of Service: 09/13/2024 Patient Name: Nathan Prince Patient MRN: [id number] Patient DOB: 1966-05-05 Provider: Arthea MARLA Manns, MD  Identifying Statement:  Nathan Prince is a 59 y.o. 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 Pathology: astrocytoma (WHO Grade II) per patient report.  10/02/2011 Surgery Craniotomy for gross total resection performed by Peggye Li, MD. Pathology: anaplastic oligoastrocytoma (WHO Grade III).  - 12/31/2011 Radiation Radiographically stable after conclusion of radiation therapy with concurrent temozolomide  75 mg/m2 daily. Recommended proceeding with five day temozolomide , dosed at 150 mg/m2 for the first cycle and escalated to 200 mg/m2 for subsequent cycles if well tolerated.  01/01/2012 - 10/27/2012 Chemotherapy Eleven cycles of five day temozolomide . Hypometabolic PET scan. Recommended discontinuing temozolomide . Serial MRI monitoring initiated.  02/27/2021 Clinical Event-Other MRI on 02/27/21 reveals new very small area of enhancement medial to resection cavity best seen on axials, concerning for possible disease progression; note made of new left frontal enhancing lesion, which was not present on the 04/11/20 scan.  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 Peggye Li for resection of tumor.  03/27/2021 Radiology Study MRI brain 4 week follow up scan shows growth of the left frontal lesion. Increased left frontal convexity enhancing dural based lesion measuring 1.9 cm AP by 1.0 cm TV by 2.0 cm CC (1.4 x 0.7 x 1.8 cm on 02/27/2021 exam). Recommend resection with Dr. Li.  07/31/2021 Radiology Study MRI post radiation for left frontal meningioma and for left temporal oligoastrocytoma surveillance. These two sites are stable. Small enhancing focus medial to left temporal resection cavity is apparent on today's scan comparable to July 2022. Will follow up with a short term MRI in another 6 weeks.  Atypical meningioma of brain (CMS-HCC) 04/20/2021 Initial Diagnosis Atypical meningioma of brain (CMS-HCC)  04/20/2021 Surgery Resection of left frontal convexity mass with Peggye Li. Pathology: atypical meningioma, WHO grade II. TERT negative.  05/31/2021 - 07/16/2021 Radiation IMRT to the left frontal convexity resected typical meningioma total dose 57.6 Gy  07/31/2021 Radiology Study MRI post radiation for left frontal meningioma and for left temporal oligoastrocytoma surveillance. These two sites are stable. Small enhancing focus medial to left temporal resection cavity is apparent on today's scan comparable to July 2022. Will follow up with a short term MRI in another 6 weeks.  11/2022 - Chemotherapy  Tibsovo    04/29/24: Nodular progression left temporal; undergoes craniotomy, resection with Dr. Li.  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    06/04/24: Completes 25Gy/5 to resection site with Dr. Michaela  06/05/24: Doses cycle #1 Lomustine  110mg /m2   Biomarkers:  MGMT Unknown.  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  EGFR Unknown  TERT Unknown   Interval History: Nathan Prince presents today for follow up, now having completed cycle #2 of CCNU,  recent  MRI brain.  No notable changes today.  Continues to have issues with expressive language, as prior.  No further decadron .  No frank breakthrough seizures.  Remains on Onfi  10mg  daily in addition to Vimpat  200mg  BID, Keppra  2000mg  BID.  Depression and mood swings continue to be a problem at times.  H+P (11/04/22) Patient presents today to establish care for local treatment of progressive grade 3 astrocytoma, IDHmt.  Nathan Prince and Nathan Prince wife describe decline in function, energy, cognition, language expression since Nathan Prince hospitalization in January for status epilepticus.  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  Currently dosing Keppra  2000mg  BID, Vimpat  200mg  BID, and Depakote  1000mg  BID.  Not on any steroids.  Nathan Prince will be proceeding with Tibsovo  therapy per [facility] team recommendations.  Medications: Current Outpatient Medications on File Prior to Visit  Medication Sig Dispense Refill   Calcium Carb-Cholecalciferol (CALCIUM 600/VITAMIN D3 PO) Take 1 tablet by mouth 2 (two) times daily.     cloBAZam  (ONFI ) 10 MG tablet Take 1 tablet (10 mg total) by mouth at bedtime. 30 tablet 1   clonazePAM (KLONOPIN) 1 MG disintegrating tablet Take 1 mg by mouth daily as needed.     dexamethasone  (DECADRON ) 4 MG tablet Take 1 tablet (4 mg total) by mouth daily. (Patient not taking: Reported on 08/23/2024) 60 tablet 1   lacosamide  (VIMPAT ) 200 MG TABS tablet TAKE 1 TABLET BY MOUTH TWICE A DAY 60 tablet 3   levETIRAcetam  1000 MG TB24 Take 2,000 mg by mouth 2 (two) times daily. 112 tablet 2   melatonin 3 MG TABS tablet Take 6 mg by mouth at bedtime.     Multiple Vitamins-Minerals (MULTIVITAMINS THER. W/MINERALS) TABS Take 1 tablet by mouth daily.      omeprazole (PRILOSEC) 20 MG capsule Take 20 mg by mouth daily. (Patient not taking: Reported on 08/23/2024)     ondansetron  (ZOFRAN ) 8 MG tablet Take 1 tablet (8 mg total) by mouth every 8 (eight) hours as needed for  nausea or vomiting. 30 tablet 1   pyridoxine (B-6) 100 MG tablet Take by mouth.     sertraline  (ZOLOFT ) 25 MG tablet TAKE 1 TABLET (25 MG TOTAL) BY MOUTH DAILY. (Patient taking differently: Take 100 mg by mouth daily.) 90 tablet 0   traZODone (DESYREL) 50 MG tablet Take 25 mg by mouth at bedtime.     Current Facility-Administered Medications on File Prior to Visit  Medication Dose Route Frequency Provider Last Rate Last Admin   topical emolient (BIAFINE) emulsion   Topical PRN Patrcia Cough, MD   Given at 11/15/11 1401    Allergies: No Known Allergies Past Medical History:  Past Medical History:  Diagnosis Date   Astrocytoma (HCC) 09/17/2011   subsequent oligodendroglioma in 2022   Depression    Low testosterone  03/30/2013   Radiation 11/04/11-12/19/11   Left temporal brain 59.4 gray in 33 fractions   Skin cancer 09/09/1986   Past Surgical History:  Past Surgical History:  Procedure Laterality Date   BACK SURGERY  09/09/1994   ruptured disc   CRANIOTOMY  09/04/2011   Procedure: CRANIOTOMY TUMOR EXCISION;  Surgeon: Catalina CHRISTELLA Stains;  Location: [facility];  Service: Neurosurgery;  Laterality: Left;  Left Temporal craniectomy for tumor   MASS BIOPSY  09/05/2011   left temporal lobe   skin cancer removal  1988   Removed skin cancer to left chest   Social History:  Social History   Socioeconomic  History   Marital status: Married    Spouse name: Not on file   Number of children: Not on file   Years of education: Not on file   Highest education level: Not on file  Occupational History   Occupation: history teacher at [employer]  Tobacco Use   Smoking status: Never   Smokeless tobacco: Never   Tobacco comments:    never used tobacco  Vaping Use   Vaping status: Never Used  Substance and Sexual Activity   Alcohol use: No    Alcohol/week: 0.0 standard drinks of alcohol   Drug use: No   Sexual activity: Not on file  Other Topics Concern   Not on file  Social  History Narrative   Not on file   Social Drivers of Health   Tobacco Use: Low Risk (09/06/2024)   Patient History    Smoking Tobacco Use: Never    Smokeless Tobacco Use: Never    Passive Exposure: Not on file  Financial Resource Strain: Low Risk  (04/30/2024)   Received from [facility]   Overall Financial Resource Strain (CARDIA)    Difficulty of Paying Living Expenses: Not hard at all  Food Insecurity: No Food Insecurity (04/30/2024)   Received from [facility]   Epic    Within the past 12 months, you worried that your food would run out before you got the money to buy more.: Never true    Within the past 12 months, the food you bought just didn't last and you didn't have money to get more.: Never true  Transportation Needs: Unknown (04/30/2024)   Received from [facility] - Transportation    In the past 12 months, has lack of transportation kept you from medical appointments or from getting medications?: No    Lack of Transportation (Non-Medical): Not on file  Physical Activity: Not on file  Stress: Not on file  Social Connections: Not on file  Intimate Partner Violence: Not At Risk (09/24/2022)   Humiliation, Afraid, Rape, and Kick questionnaire    Fear of Current or Ex-Partner: No    Emotionally Abused: No    Physically Abused: No    Sexually Abused: No  Depression (PHQ2-9): Medium Risk (08/23/2024)   Depression (PHQ2-9)    PHQ-2 Score: 5  Alcohol Screen: Not on file  Housing: Low Risk  (09/07/2024)   Received from [facility]   Epic    In the last 12 months, was there a time when you were not able to pay the mortgage or rent on time?: No    In the past 12 months, how many times have you moved where you were living?: 0    At any time in the past 12 months, were you homeless or living in a shelter (including now)?: No  Utilities: Not At Risk (04/30/2024)   Received from [facility]   Epic    In the past 12 months has the electric, gas, oil, or water company threatened to shut off services in your home?: No  Health Literacy: Not on file   Family History: No family history on file.  Review of Systems: Constitutional: Doesn't report fevers, chills or abnormal weight loss Eyes: Doesn't report blurriness of vision Ears, nose, mouth, throat, and face: Doesn't report sore throat Respiratory: Doesn't report cough, dyspnea or wheezes Cardiovascular: Doesn't report palpitation, chest discomfort  Gastrointestinal:  Doesn't report nausea, constipation, diarrhea GU:  Doesn't report incontinence Skin: Doesn't report skin rashes Neurological: Per HPI Musculoskeletal: Doesn't report joint pain Behavioral/Psych: Doesn't report anxiety  Physical Exam: Vitals:   09/13/24 1518  BP: (!) 121/90  Pulse: 88  Resp: 17  Temp: 97.7 F (36.5 C)  SpO2: 99%   KPS: 70. General: Alert, cooperative, pleasant, in no acute distress Head: Craniotomy scar EENT: No conjunctival injection or scleral icterus.  Lungs: Resp effort normal Cardiac: Regular rate Abdomen: Non-distended abdomen Skin: No rashes cyanosis or petechiae. Extremities: No clubbing or edema  Neurologic Exam: Mental Status: Awake, alert, attentive to examiner. Oriented to self and environment. Language is modestly impaired with regards to fluency.  Age advanced pyschomotor slowing Cranial Nerves: Visual acuity is grossly normal. Visual fields are full. Extra-ocular movements intact. No ptosis. Face is symmetric Motor: Tone and bulk are normal. Power is full in both arms and legs. Reflexes are symmetric, no pathologic reflexes present.  Sensory: Intact to light touch Gait: Dystaxic   Labs: I have reviewed the data as listed    Component Value Date/Time   NA 141 09/06/2024 0913   NA 141 10/28/2022 0946   NA 140 03/28/2015 0957   K 4.1 09/06/2024 0913   K 3.9 03/28/2015 0957   CL 106 09/06/2024 0913   CL  104 03/28/2015 0957   CO2 24 09/06/2024 0913   CO2 26 03/28/2015 0957   GLUCOSE 90 09/06/2024 0913   GLUCOSE 124 (H) 03/28/2015 0957   BUN 17 09/06/2024 0913   BUN 16 10/28/2022 0946   BUN 14 03/28/2015 0957   CREATININE 1.14 09/06/2024 0913   CREATININE 1.1 03/28/2015 0957   CALCIUM 9.8 09/06/2024 0913   CALCIUM 9.0 03/28/2015 0957   PROT 7.3 09/06/2024 0913   PROT 6.9 10/28/2022 0946   PROT 7.2 03/28/2015 0957   ALBUMIN 4.5 09/06/2024 0913   ALBUMIN 4.5 10/28/2022 0946   AST 24 09/06/2024 0913   ALT 19 09/06/2024 0913   ALT 27 03/28/2015 0957   ALKPHOS 96 09/06/2024 0913   ALKPHOS 81 03/28/2015 0957   BILITOT 0.3 09/06/2024 0913   GFRNONAA >60 09/06/2024 0913   GFRAA >90 09/05/2011 0402   Lab Results  Component Value Date   WBC 4.9 09/06/2024   NEUTROABS 3.5 09/06/2024   HGB 14.8 09/06/2024   HCT 43.4 09/06/2024   MCV 89.9 09/06/2024   PLT 160 09/06/2024    EXAM:  Unenhanced and enhanced Brain MRI   INDICATION:  Brain tumor follow-up. Left temporal astrocytoma. WHO grade 3.   TECHNIQUE: Sagittal T1, axial T1, T2, FLAIR, and diffusion images were  obtained without and with contrast.   CONTRAST DOSE: 10 mL Vueway.   COMPARISON: 07/05/2024. Radiation planning MRI 05/20/2024, brain MRI  04/29/2024   FINDINGS:   Postoperative changes from left frontotemporal craniotomy. There is a  region of thickened and nodular enhancement in the left temporal lobe for  example image 76 series 11 measuring 1.6 x 0.9 cm, previously 1.5 x 0.8 cm  on 07/05/2024, not well seen on the previous exams from August and  September.   A region of nodular enhancement just superior to this for example image 83  series 11 and image 112 series 1101 also appears increased from the prior  examination. There is mildly thickened enhancement along the periphery of  the resection cavity extending down inferiorly for instance image 65-68 of  series 11 also appearing increased from the recent prior  examinations. The  other linear areas of enhancement more  posteriorly and superiorly are  similar to decreased from the prior.   There is overall similar confluent FLAIR signal surrounding the temporal  resection cavity and extending into the periventricular region as compared  with 07/05/2024.   Ex vacuo dilatation of the left lateral ventricle. No acute or subacute  infarct. There is susceptibility associated with the temporal resection  cavity.   IMPRESSION:   Increased nodular enhancement in the left temporal lobe compared with  07/05/2024 and 05/20/2024 concerning for disease progression.   Electronically Signed by:  Sybil Fairly, MD, [facility] Radiology  Electronically Signed on:  09/07/2024 1:46 PM    Assessment/Plan Astrocytoma [facility])  Seizure (HCC)  Dairon Procter presents today, now having completed cycle #2 CCNU, following re-irradiation therapy on 06/04/24.  Labs from 12/29 demonstrate resolution of thrombocytopenia.  MRI brain demonstrates some increased enhancement with the re-irradiation field only, consistent with likely treatment effect.   [facility] team has recommended continuing current treatment plan.   Patient elected to continue with cycle #3 oral CCNU 110mg /m2 q6 weeks.  We reviewed side effects of CCNU, including fatigue, nausea vomiting, cytopenias, ILD.  We reviewed side effects of avastin, including hypertension, bleeding/clotting events, wound healing impairment.  The patient will have a complete blood count, a comprehensive metabolic panel q2 weeks. Labs may need to be performed more often. Zofran  will prescribed for home use for nausea/vomiting.   Chemotherapy should be held for the following:  ANC less than 1,000  Platelets less than 100,000  LFT or creatinine greater than 2x ULN  If clinical concerns/contraindications develop  Should remain off decadron  if tolerated.  Will con't Keppra  (2000mg  BID ER), Vimpat  (200mg  BID) and Onfi  (10mg  daily) at this  time.    May con't zoloft  25mg  daily for depression symptoms.    We ask that Cortez Flippen return to clinic in 6 weeks following cycle #3, or sooner as needed.  Labs will also be drawn every 2 weeks, or more often if needed.  All questions were answered. The patient knows to call the clinic with any problems, questions or concerns. No barriers to learning were detected.  The total time spent in the encounter was 40 minutes and more than 50% was on counseling and review of test results   Arthea MARLA Manns, MD Medical Director of Neuro-Oncology [facility] [place] 09/13/2024 3:16 PM "

## 2024-09-14 ENCOUNTER — Other Ambulatory Visit (HOSPITAL_COMMUNITY): Payer: Self-pay

## 2024-09-14 ENCOUNTER — Ambulatory Visit: Attending: Internal Medicine

## 2024-09-14 ENCOUNTER — Other Ambulatory Visit: Payer: Self-pay

## 2024-09-14 DIAGNOSIS — R4701 Aphasia: Secondary | ICD-10-CM | POA: Insufficient documentation

## 2024-09-14 NOTE — Therapy (Signed)
 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   Depression    Low testosterone  03/30/2013   Radiation 11/04/11-12/19/11   Left temporal brain 59.4 gray in 33 fractions   Skin cancer 09/09/1986   Past Surgical History:  Procedure Laterality Date   BACK SURGERY  09/09/1994   ruptured disc   CRANIOTOMY  09/04/2011   Procedure: CRANIOTOMY TUMOR EXCISION;  Surgeon: Catalina CHRISTELLA Stains;  Location: [facility];  Service: Neurosurgery;  Laterality: Left;  Left Temporal craniectomy for tumor   MASS BIOPSY  09/05/2011   left temporal lobe   skin cancer removal  1988   Removed skin cancer to left chest   Patient Active Problem List   Diagnosis Date Noted   Stroke-like symptoms 09/22/2022   Seizure (HCC) 09/22/2022   Low testosterone  03/30/2013   Depression    Skin cancer    Radiation    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  Pt accompanied by: significant other wife [name]  PERTINENT HISTORY: Pt has had a course of home health ST between 05-10-24 and  today. VASLOW-07/12/24: 04/20/2021 Initial Diagnosis Atypical meningioma of brain (CMS-HCC)  04/20/2021 Surgery Resection of left frontal convexity mass with Peggye Li. Pathology: atypical meningioma, WHO grade II. TERT negative.  05/31/2021 - 07/16/2021 Radiation IMRT to the left frontal convexity resected typical meningioma total dose 57.6 Gy  07/31/2021 Radiology Study MRI post radiation for left frontal meningioma and for left temporal oligoastrocytoma surveillance. These two sites are stable. Small enhancing focus medial to left temporal resection cavity is apparent on today's scan comparable to July 2022. Will follow up with a short term MRI in another 6 weeks.   11/2022 - Chemotherapy  Tibsovo     04/29/24: Nodular progression left temporal; undergoes craniotomy, resection with Dr. Li.  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     06/04/24: Completes 25Gy/5 to resection site with Dr. 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 An MRI in October showed reduced inflammation and tumor size. He experiences transcortical motor aphasia, with increased difficulty in the afternoon, and a visual field defect with right superior quadrantanopia. No new seizures have occurred since surgery, indicating  effective tumor control. Continue the current antiepileptic regimen and speech therapy.   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 The current antiepileptic regimen includes Lacosamide , Levetiracetam , Clonazepam, and Clobazam , with some noted  fatigue but no significant side effects. Seizure control is prioritized over potential medication side effects. Continue the current antiepileptic regimen and monitor for seizure recurrence. Consider weaning medications if seizure-free for six months.   Transcortical motor aphasia   Post-surgical language difficulties are characterized by transcortical motor aphasia, with word finding and substitution issues, particularly in the afternoon. Speech therapy is ongoing. Continue speech therapy.   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   Gait disturbance   A slightly broad-based stooped gait is more pronounced with increased activity, likely related to post-surgical changes. No concerning features like Parkinsonism are present. Continue to monitor gait changes.  PAIN:  Are you having pain? No  FALLS: Has patient fallen in last 6 months?  No   PATIENT GOALS: Improve to the point of being able to work  OBJECTIVE:  Note: Objective measures were completed at Evaluation unless otherwise noted.   ORAL MOTOR EXAMINATION: Overall status: Did not assess due to time - will assess next session  STANDARDIZED ASSESSMENTS: B[place] NAMING TEST: (BNT-2): Pt scored 15/30 with odds presentation of BNT-2 which is >2 standard deviations below the mean.         QUICK APHASIA BATTERY - 08/25/24 Summary      Word comprehension 10.00  Sentence comprehension 7.92  Word finding 5.75  Grammatical construction 5.50  Speech motor programming 10.00  Repetition 7.92  Reading 8.75  QAB overall 7.50   Pt's performance with this assessment indicates a mild to moderate aphasia mainly with pt's expressive language. Strength is reading and word and sentence comprehension. PT's conversational speech includes omitted content words, some semantic paraphasias, anomia fostering intermittently successful compensations. QAB overall  score Severity 0.00-4.99  severe 5.00-7.49  moderate 7.50-8.89  mild 8.90-10.00  no aphasia   PATIENT REPORTED OUTCOME MEASURES (PROM):  RETURNED 09/14/24 Communication Effectiveness Survey - KAREN        How effective is your speech... Pt Rating  Having a conversation with a family member or friends at home 3  Participating in conversation with strangers in a quiet place  3  Conversing with a familiar person over the telephone 3  Conversing with a stranger over the telephone 2  Being part of a conversation in a noisy environment  2  Speaking to a friend when you are emotionally upset or angry 3  Having a conversation while traveling in the car 3  Having a conversation with someone at a distance 3                                                                                               Total:  22   1= not at all effective  4= very effective    Communication Effectiveness Survey - RAY        How effective is your speech... Pt Rating  Having a conversation with a family member or friends at home 3  Participating in conversation with strangers in a quiet place  4  Conversing with a familiar person over the telephone 3  Conversing with a stranger over the telephone 3  Being part of a conversation in a noisy environment  2  Speaking to a friend when you are emotionally upset or angry 3  Having a conversation while traveling in the car 3  Having a conversation with someone at a distance 2                                                                                               Total:  23/32   1= not at all effective                                                                      4= very effective                                                                                                                             TREATMENT DATE:   09/14/24: Today PROM returned with scores above. 23/32-Ray and  22/32-Karen, with higher scores indicating less impact of speech on pt's communicative situations.  Today SLP used pt's 10 facts about [employer] to target clarity of expressive language with written support. SLP also used occasional mod cues to assist pt in communicating his idea more accurately. Written cues were helpful for Ray. Usually, pt was unable to recall words he had just verbalized 10-15 seconds prior, when cued by SLP to write down the spoken thought. Homework to complete pursuit of happiness concept.  09/08/24: PROM to be brought back next session. Today SLP worked with pt to develop word finding and accuracy with verbal expression with divergent naming task. With mod complex categories pt able to generate average 6 items without cues. Pt noted to have more difficulty with spelling after 20 minutes. SLP educated pt/Karen about work-break cycle. Homework to write at least 10 2-sentence facts about the [place] revolution that SLP might not know.    09/06/24: PROM not brought back. SLP provided another copy for [name]  and pt to fill out and return next session. Based on pt/wife report last session, SLP worked with pt to develop scripts for [employer], and a baguette. SLP discussed with pt/wife how these could be helpful for more than just at restaurants - SLP suggested pt and [name] develop these on 3x5 cards to practice and take with him.[name] demonstrated understanding how to develop cards/scripts with pt today.   08/30/24: SLP introduced Online/app-based therapy and [employer] for home-based therapy tasks. See pt instructions. Education occurred for pt/wife around the neurological processes that occur with practice. Pt's wife reported a challenging situation is ordering at restaurants with pt difficulty with asking questions: are these real potatoes? Is there sugar in the cornbread? Pt would order half diet, half regular Coke, and prefers a baguette with his soup.  Another situation is longer and deeper conversations, and a difficulty with pronouns. SLP to address these situations in upcoming sessions.   08/25/24: Needs PROM next session, and introduction to Talk [employer] therapy (and similar tech-based options) next session. Pt brought homework in, completed. SLP assisted pt with responsive naming task as pt's responses were not 100% the category targeted. Pt benefited from mod semantic cues, and rarely from phonemic cues. QAB completed today with pt. Scores above.in standardized assessments. SLP provided homework for pt until next session. Told him to work at least 20-30 minutes (or until mentally fatigued) as often as he likes during the day.   08/23/24: Pt needs QAB and PROM next session. 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 SLP ascertained that pt largely does not benefit from phonemic cues and shared this with Ray and [name]. Lastly, SLP talked briefly with pt and wife about past ST and tasks that SLP performed with Ray. SLP encouraged Ray to cont with those tasks at home for more practice.   PATIENT EDUCATION: Education details: see treatment date Person educated: Patient and Spouse Education method: Explanation, Demonstration, and Handouts Education comprehension: verbalized understanding and needs further education   GOALS: Goals reviewed with patient? No  SHORT TERM GOALS: Target date: 09/24/24  Pt will name 8 items in a simple-mod complex category, using trained compensations, 85% of opportunities   Baseline: Goal status: INITIAL  2.  Pt will demo use of trained compensatory strategies in 5 minutes simple conversation x3 sessions Baseline:  Goal status: INITIAL  3.  Pt will develop a 1-minute presentation on topic of choice using trained compensations  Baseline:   Goal status: INITIAL  4.  Pt will deliver a 1-minute presentation using trained compensations Baseline:  Goal status: INITIAL  5.  Pt will demo use of speech to text for functional completion of 1-sentence text messages Baseline:  Goal status: INITIAL  6.  With trained compensations pt will complete billpay via telephone with 100% success in 2/2 opportunities Baseline:  Goal status: INITIAL  7.  Pt will complete QAB or other aphasia assessment Baseline:  Goal status: met  LONG TERM GOALS: Target date: 10/08/24  Pt will develop a 3-minute presentation about a topic of choice with mod I Baseline:  Goal status: INITIAL  2.  Pt will deliver a 3-minute presentation with mod I Baseline:  Goal status: INITIAL  3.  Pt will demo use of speech to text for functional completion of 2-sentence text messages   Baseline:  Goal status: INITIAL  4.  With trained compensations pt will complete billpay via telephone with 100% success in at least 2/2 opportunities Baseline:  Goal status: INITIAL  5.  Pt will implement trained compensations in 3 opportunities, between sessions, when he thinks he is getting anxious about making errors in verbal expression outside of ST Baseline:  Goal status: INITIAL  5.  Pt will improve PROM Baseline:  Goal status: INITIAL   ASSESSMENT:  CLINICAL IMPRESSION: Patient is a 59 y.o. M who was seen today for treatment of speech and language in light of hx of seizures and of tumor resection. See treatment date above for today's date for further details on today's session. Pt's next oral chemo will be 09/17/24. Lt-sided brain tumor resected in August 2025 with radiation completed to the affected area. He has had a course of HHST since that sx. Functionally, pt was forced to retire recently, and has difficulty with verbal expression so that [name] (wife) needs to listen when pt pays bills via phone, and that pt uses pronouns interchangeably at times making  tracking his conversation challenging at times. Lastly, his language declines when he experiences difficulty due to anxiety about making errors. [name] indicated pt is still working through some depression regarding his current situation. 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  OBJECTIVE IMPAIRMENTS: include aphasia. These impairments are limiting patient from managing medications, managing appointments, managing finances, household responsibilities, ADLs/IADLs, and effectively communicating at home and in community. Factors affecting potential to achieve goals and functional outcome are co-morbidities, medical prognosis, and severity of impairments. Patient will benefit from skilled SLP services to address above impairments and improve overall function.  REHAB POTENTIAL: Fair given above factors  PLAN:  SLP FREQUENCY: 2x/week  SLP DURATION: 6 weeks  PLANNED INTERVENTIONS: Language facilitation, Environmental controls, Cueing hierachy, Internal/external aids, Functional tasks, Multimodal communication approach, SLP instruction and feedback, Compensatory strategies, Patient/family education, and [id number] Treatment of speech (30 or 45 min)     Earnestine Tuohey, CCC-SLP 09/14/2024, 5:42 PM      "

## 2024-09-15 ENCOUNTER — Encounter: Payer: Self-pay | Admitting: Pharmacist

## 2024-09-15 ENCOUNTER — Other Ambulatory Visit: Payer: Self-pay

## 2024-09-15 ENCOUNTER — Other Ambulatory Visit (HOSPITAL_COMMUNITY): Payer: Self-pay

## 2024-09-15 NOTE — Progress Notes (Signed)
 Specialty Pharmacy Ongoing Clinical Assessment Note  Nathan Prince is a 59 y.o. male who is being followed by the specialty pharmacy service for RxSp Oncology   Patient's specialty medication(s) reviewed today: Lomustine  (GLEOSTINE )   Missed doses in the last 4 weeks: 0   Patient/Caregiver did not have any additional questions or concerns.   Therapeutic benefit summary: Patient is achieving benefit   Adverse events/side effects summary: Experienced adverse events/side effects   Patient's therapy is appropriate to: Continue    Goals Addressed             This Visit's Progress    Maintain optimal adherence to therapy   On track    Patient is on track. Patient will maintain adherence         Follow up: 3 months  Nathan Prince Specialty Pharmacist

## 2024-09-15 NOTE — Progress Notes (Signed)
 Specialty Pharmacy Refill Coordination Note  Nathan Prince is a 59 y.o. male contacted today regarding refills of specialty medication(s) Lomustine  (GLEOSTINE )   Patient requested [name] at [facility] [place] date: 09/16/24   Medication will be filled on: 09/16/24      Okay with copays-pay at pick up

## 2024-09-16 ENCOUNTER — Ambulatory Visit

## 2024-09-16 ENCOUNTER — Other Ambulatory Visit: Payer: Self-pay

## 2024-09-16 DIAGNOSIS — R4701 Aphasia: Secondary | ICD-10-CM | POA: Diagnosis not present

## 2024-09-16 NOTE — Therapy (Signed)
 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   Depression    Low testosterone  03/30/2013   Radiation 11/04/11-12/19/11   Left temporal brain 59.4 gray in 33 fractions   Skin cancer 09/09/1986   Past Surgical History:  Procedure Laterality Date   BACK SURGERY  09/09/1994   ruptured disc   CRANIOTOMY  09/04/2011   Procedure: CRANIOTOMY TUMOR EXCISION;  Surgeon: Catalina CHRISTELLA Stains;  Location: [facility];  Service: Neurosurgery;  Laterality: Left;  Left Temporal craniectomy for tumor   MASS BIOPSY  09/05/2011   left temporal lobe   skin cancer removal  1988   Removed skin cancer to left chest   Patient Active Problem List   Diagnosis Date Noted   Stroke-like symptoms 09/22/2022   Seizure (HCC) 09/22/2022   Low testosterone  03/30/2013   Depression    Skin cancer    Radiation    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 A busy place today.  Pt accompanied by: self   PERTINENT HISTORY: Pt has had a course of home health ST between 05-10-24 and today. VASLOW-07/12/24: 04/20/2021 Initial Diagnosis Atypical meningioma  of brain (CMS-HCC)  04/20/2021 Surgery Resection of left frontal convexity mass with Peggye Li. Pathology: atypical meningioma, WHO grade II. TERT negative.  05/31/2021 - 07/16/2021 Radiation IMRT to the left frontal convexity resected typical meningioma total dose 57.6 Gy  07/31/2021 Radiology Study MRI post radiation for left frontal meningioma and for left temporal oligoastrocytoma surveillance. These two sites are stable. Small enhancing focus medial to left temporal resection cavity is apparent on today's scan comparable to July 2022. Will follow up with a short term MRI in another 6 weeks.   11/2022 - Chemotherapy  Tibsovo     04/29/24: Nodular progression left temporal; undergoes craniotomy, resection with Dr. Li.  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     06/04/24: Completes 25Gy/5 to resection site with Dr. 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 An MRI in October showed reduced inflammation and tumor size. He experiences transcortical motor aphasia, with increased difficulty in the afternoon, and a visual field defect with right superior quadrantanopia. No new seizures have occurred since surgery, indicating effective tumor control. Continue the current antiepileptic regimen and speech  therapy.   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 The current antiepileptic regimen includes Lacosamide , Levetiracetam , Clonazepam, and Clobazam , with some noted fatigue but no significant side effects. Seizure control is prioritized  over potential medication side effects. Continue the current antiepileptic regimen and monitor for seizure recurrence. Consider weaning medications if seizure-free for six months.   Transcortical motor aphasia   Post-surgical language difficulties are characterized by transcortical motor aphasia, with word finding and substitution issues, particularly in the afternoon. Speech therapy is ongoing. Continue speech therapy.   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   Gait disturbance   A slightly broad-based stooped gait is more pronounced with increased activity, likely related to post-surgical changes. No concerning features like Parkinsonism are present. Continue to monitor gait changes.  PAIN:  Are you having pain? No  FALLS: Has patient fallen in last 6 months?  No   PATIENT GOALS: Improve to the point of being able to work  OBJECTIVE:  Note: Objective measures were completed at Evaluation unless otherwise noted.   ORAL MOTOR EXAMINATION: Overall status: Did not assess due to time - will assess next session  STANDARDIZED ASSESSMENTS: B[place]ON NAMING TEST: (BNT-2): Pt scored 15/30 with odds presentation of BNT-2 which is >2 standard deviations below the mean.         QUICK APHASIA BATTERY - 08/25/24 Summary      Word comprehension 10.00  Sentence comprehension 7.92  Word finding 5.75  Grammatical construction 5.50  Speech motor programming 10.00  Repetition 7.92  Reading 8.75  QAB overall 7.50   Pt's performance with this assessment indicates a mild to moderate aphasia mainly with pt's expressive language. Strength is reading and word and sentence comprehension. PT's conversational speech includes omitted content words, some semantic paraphasias, anomia fostering intermittently successful compensations. QAB overall score Severity 0.00-4.99  severe 5.00-7.49  moderate 7.50-8.89  mild 8.90-10.00  no  aphasia   PATIENT REPORTED OUTCOME MEASURES (PROM):  RETURNED 09/14/24 Communication Effectiveness Survey - KAREN        How effective is your speech... Pt Rating  Having a conversation with a family member or friends at home 3  Participating in conversation with strangers in a quiet place  3  Conversing with a familiar person over the telephone 3  Conversing with a stranger over the telephone 2  Being part of a conversation in a noisy environment  2  Speaking to a friend when you are emotionally upset or angry 3  Having a conversation while traveling in the car 3  Having a conversation with someone at a distance 3                                                                                               Total:  22   1= not at all effective  4= very effective    Communication Effectiveness Survey - RAY        How effective is your speech... Pt Rating  Having a conversation with a family member or friends at home 3  Participating in conversation with strangers in a quiet place  4  Conversing with a familiar person over the telephone 3  Conversing with a stranger over the telephone 3  Being part of a conversation in a noisy environment  2  Speaking to a friend when you are emotionally upset or angry 3  Having a conversation while traveling in the car 3  Having a conversation with someone at a distance 2                                                                                               Total:  23/32   1= not at all effective                                                                      4= very effective                                                                                                                             TREATMENT DATE:   09/16/24:Talk about billpay compensations next session, with [name] present. Today in prep for pt speaking for one minute about a topic of choice, SLP and pt  corrected pt's grammar and his wording. When pt read what he wrote prior to last session he demonstrated understanding that it did not make sense but req'd consistent mod-max cues for problem solving how to correct the phrasing. Pt to correct the last three points at home and then write out the correct responses at home for next session.   09/14/24: Today PROM returned with scores above. 23/32-Ray and 22/32-Karen, with higher scores indicating less impact of speech on pt's communicative situations.  Today SLP used pt's 10 facts about [employer] to target clarity of expressive language with written support. SLP also used occasional mod cues to assist pt in communicating his idea more accurately. Written cues were helpful for Ray. Usually, pt was unable to recall words he had just verbalized 10-15 seconds prior, when cued by SLP to write down the spoken thought. Homework to complete pursuit of happiness concept.  09/08/24: PROM to be brought back next session. Today SLP worked with pt to develop word finding and accuracy with verbal expression with divergent naming task. With mod complex categories pt able to generate average 6 items without cues. Pt noted to have more difficulty with spelling after 20 minutes. SLP educated pt/Karen about work-break cycle. Homework to write at least 10 2-sentence facts about the [employer] revolution that SLP might not know.    09/06/24: PROM not brought back. SLP provided another copy for [name] and pt to fill out and return next session. Based on pt/wife report last session, SLP worked with pt to develop scripts for [employer], and a baguette. SLP discussed with pt/wife how these could be helpful for more than just at restaurants - SLP suggested pt and [name] develop these on 3x5 cards to practice and take with him.[name] demonstrated understanding how to develop cards/scripts with pt today.   08/30/24: SLP introduced Online/app-based therapy and [employer] for home-based therapy tasks. See pt instructions. Education occurred for pt/wife around the neurological processes that occur with practice. Pt's wife reported a challenging situation is ordering at restaurants with pt difficulty with asking questions: are these real potatoes? Is there sugar in the cornbread? Pt would order half diet, half regular Coke, and prefers a baguette with his soup. Another situation is longer and deeper conversations, and a difficulty with pronouns. SLP to address these situations in upcoming sessions.   08/25/24: Needs PROM next session, and introduction to Talk [employer] therapy (and similar tech-based options) next session. Pt brought homework in, completed. SLP assisted pt with responsive naming task as pt's responses were not 100% the category targeted. Pt benefited from mod semantic cues, and rarely from phonemic cues. QAB completed today with pt. Scores above.in standardized assessments. SLP provided homework for pt until next session. Told him to work at least 20-30 minutes (or until mentally fatigued) as often as he likes during the day.   08/23/24: Pt needs QAB and PROM next session. 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 SLP ascertained that pt largely does not benefit from phonemic cues and shared this with Ray and [name]. Lastly, SLP talked briefly with pt and wife about past ST and tasks that SLP performed with Ray. SLP encouraged Ray to cont with those tasks at home for more practice.   PATIENT EDUCATION: Education details: see treatment date Person educated: Patient and Spouse Education method: Explanation, Demonstration, and Handouts Education comprehension: verbalized understanding and needs further education   GOALS: Goals reviewed with patient? No  SHORT TERM GOALS: Target  date: 09/24/24  Pt will name 8 items in a simple-mod complex category, using trained compensations, 85% of opportunities   Baseline: Goal status: INITIAL  2.  Pt will demo use of trained compensatory strategies in 5 minutes simple conversation x3 sessions Baseline:  Goal status: INITIAL  3.  Pt will develop a 1-minute presentation on topic of choice using trained compensations  Baseline:  Goal status: INITIAL  4.  Pt will deliver a 1-minute presentation using trained compensations Baseline:  Goal status: INITIAL  5.  Pt will demo use of speech to text for functional completion of 1-sentence text messages Baseline:  Goal status: INITIAL  6.  With trained compensations pt will complete billpay via telephone with 100% success in 2/2  opportunities Baseline:  Goal status: INITIAL  7.  Pt will complete QAB or other aphasia assessment Baseline:  Goal status: met  LONG TERM GOALS: Target date: 10/08/24  Pt will develop a 3-minute presentation about a topic of choice with mod I Baseline:  Goal status: INITIAL  2.  Pt will deliver a 3-minute presentation with mod I Baseline:  Goal status: INITIAL  3.  Pt will demo use of speech to text for functional completion of 2-sentence text messages   Baseline:  Goal status: INITIAL  4.  With trained compensations pt will complete billpay via telephone with 100% success in at least 2/2 opportunities Baseline:  Goal status: INITIAL  5.  Pt will implement trained compensations in 3 opportunities, between sessions, when he thinks he is getting anxious about making errors in verbal expression outside of ST Baseline:  Goal status: INITIAL  5.  Pt will improve PROM Baseline:  Goal status: INITIAL   ASSESSMENT:  CLINICAL IMPRESSION: Patient is a 59 y.o. M who was seen today for treatment of speech and language in light of hx of seizures and of tumor resection. See treatment date above for today's date for further details on today's  session. Pt's next oral chemo will be 09/17/24. Lt-sided brain tumor resected in August 2025 with radiation completed to the affected area. He has had a course of HHST since that sx. Functionally, pt was forced to retire recently, and has difficulty with verbal expression so that [name] (wife) needs to listen when pt pays bills via phone, and that pt uses pronouns interchangeably at times making tracking his conversation challenging at times. Lastly, his language declines when he experiences difficulty due to anxiety about making errors. [name] indicated pt is still working through some depression regarding his current situation. 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  OBJECTIVE IMPAIRMENTS: include aphasia. These impairments are limiting patient from managing medications, managing appointments, managing finances, household responsibilities, ADLs/IADLs, and effectively communicating at home and in community. Factors affecting potential to achieve goals and functional outcome are co-morbidities, medical prognosis, and severity of impairments. Patient will benefit from skilled SLP services to address above impairments and improve overall function.  REHAB POTENTIAL: Fair given above factors  PLAN:  SLP FREQUENCY: 2x/week  SLP DURATION: 6 weeks  PLANNED INTERVENTIONS: Language facilitation, Environmental controls, Cueing hierachy, Internal/external aids, Functional tasks, Multimodal communication approach, SLP instruction and feedback, Compensatory strategies, Patient/family education, and [id number] Treatment of speech (30 or 45 min)     Dustine Stickler, CCC-SLP 09/16/2024, 4:27 PM      "

## 2024-09-20 ENCOUNTER — Other Ambulatory Visit: Payer: Self-pay

## 2024-09-21 ENCOUNTER — Ambulatory Visit

## 2024-09-21 ENCOUNTER — Other Ambulatory Visit: Payer: Self-pay

## 2024-09-21 DIAGNOSIS — R4701 Aphasia: Secondary | ICD-10-CM | POA: Diagnosis not present

## 2024-09-21 NOTE — Therapy (Signed)
 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   Depression    Low testosterone  03/30/2013   Radiation 11/04/11-12/19/11   Left temporal brain 59.4 gray in 33 fractions   Skin cancer 09/09/1986   Past Surgical History:  Procedure Laterality Date   BACK SURGERY  09/09/1994   ruptured disc   CRANIOTOMY  09/04/2011   Procedure: CRANIOTOMY TUMOR EXCISION;  Surgeon: Catalina CHRISTELLA Stains;  Location: [facility];  Service: Neurosurgery;  Laterality: Left;  Left Temporal craniectomy for tumor   MASS BIOPSY  09/05/2011   left temporal lobe   skin cancer removal  1988   Removed skin cancer to left chest   Patient Active Problem List   Diagnosis Date Noted   Stroke-like symptoms 09/22/2022   Seizure (HCC) 09/22/2022   Low testosterone  03/30/2013   Depression    Skin cancer    Radiation    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   Pt accompanied by: self   PERTINENT HISTORY: Pt has had a course of home health ST  between 05-10-24 and today. VASLOW-07/12/24: 04/20/2021 Initial Diagnosis Atypical meningioma of brain (CMS-HCC)  04/20/2021 Surgery Resection of left frontal convexity mass with Peggye Li. Pathology: atypical meningioma, WHO grade II. TERT negative.  05/31/2021 - 07/16/2021 Radiation IMRT to the left frontal convexity resected typical meningioma total dose 57.6 Gy  07/31/2021 Radiology Study MRI post radiation for left frontal meningioma and for left temporal oligoastrocytoma surveillance. These two sites are stable. Small enhancing focus medial to left temporal resection cavity is apparent on today's scan comparable to July 2022. Will follow up with a short term MRI in another 6 weeks.   11/2022 - Chemotherapy  Tibsovo     04/29/24: Nodular progression left temporal; undergoes craniotomy, resection with Dr. Li.  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     06/04/24: Completes 25Gy/5 to resection site with Dr. 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 An MRI in October showed reduced inflammation and tumor size. He experiences transcortical motor aphasia, with increased difficulty in the afternoon, and a visual field defect with right superior quadrantanopia. No new seizures have occurred  since surgery, indicating effective tumor control. Continue the current antiepileptic regimen and speech therapy.   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 The current antiepileptic regimen includes Lacosamide , Levetiracetam , Clonazepam, and  Clobazam , with some noted fatigue but no significant side effects. Seizure control is prioritized over potential medication side effects. Continue the current antiepileptic regimen and monitor for seizure recurrence. Consider weaning medications if seizure-free for six months.   Transcortical motor aphasia   Post-surgical language difficulties are characterized by transcortical motor aphasia, with word finding and substitution issues, particularly in the afternoon. Speech therapy is ongoing. Continue speech therapy.   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   Gait disturbance   A slightly broad-based stooped gait is more pronounced with increased activity, likely related to post-surgical changes. No concerning features like Parkinsonism are present. Continue to monitor gait changes.  PAIN:  Are you having pain? No  FALLS: Has patient fallen in last 6 months?  No   PATIENT GOALS: Improve to the point of being able to work  OBJECTIVE:  Note: Objective measures were completed at Evaluation unless otherwise noted.   ORAL MOTOR EXAMINATION: Overall status: Did not assess due to time - will assess next session  STANDARDIZED ASSESSMENTS: B[place]ON NAMING TEST: (BNT-2): Pt scored 15/30 with odds presentation of BNT-2 which is >2 standard deviations below the mean.         QUICK APHASIA BATTERY - 08/25/24 Summary      Word comprehension 10.00  Sentence comprehension 7.92  Word finding 5.75  Grammatical construction 5.50  Speech motor programming 10.00  Repetition 7.92  Reading 8.75  QAB overall 7.50   Pt's performance with this assessment indicates a mild to moderate aphasia mainly with pt's expressive language. Strength is reading and word and sentence comprehension. PT's conversational speech includes omitted content words, some semantic paraphasias, anomia fostering intermittently successful compensations. QAB overall  score Severity 0.00-4.99  severe 5.00-7.49  moderate 7.50-8.89  mild 8.90-10.00  no aphasia   PATIENT REPORTED OUTCOME MEASURES (PROM):  RETURNED 09/14/24 Communication Effectiveness Survey - KAREN        How effective is your speech... Pt Rating  Having a conversation with a family member or friends at home 3  Participating in conversation with strangers in a quiet place  3  Conversing with a familiar person over the telephone 3  Conversing with a stranger over the telephone 2  Being part of a conversation in a noisy environment  2  Speaking to a friend when you are emotionally upset or angry 3  Having a conversation while traveling in the car 3  Having a conversation with someone at a distance 3                                                                                               Total:  22   1= not at all effective  4= very effective    Communication Effectiveness Survey - RAY        How effective is your speech... Pt Rating  Having a conversation with a family member or friends at home 3  Participating in conversation with strangers in a quiet place  4  Conversing with a familiar person over the telephone 3  Conversing with a stranger over the telephone 3  Being part of a conversation in a noisy environment  2  Speaking to a friend when you are emotionally upset or angry 3  Having a conversation while traveling in the car 3  Having a conversation with someone at a distance 2                                                                                               Total:  23/32   1= not at all effective                                                                      4= very effective                                                                                                                             TREATMENT DATE:   09/21/24: JENINE ABOUT BILLPAY COMPENSATIONS NEXT SESSION. SLP assisted  pt correct 2 of his last 3 homework selections from previous session Pt noted his errors 100%, but SLP necessary to provide mod-max cues for how to fix these errors. Pt omitted functor words and used near-correct vocabulary. Pt fatigue noted as session progressed as he initially made more suggestions to correct errors than after 35 minutes into the session. SLP observed (and [name] indicated) [name] was taking notes how to assist pt at home.   09/16/24:Talk about billpay compensations next session, with [name] present. Today in prep for pt speaking for one minute about a topic of choice, SLP and pt corrected pt's grammar and his wording. When pt read what he wrote prior to last session he demonstrated understanding that it did not make sense but req'd consistent mod-max cues for problem solving how to correct the phrasing. Pt to correct the last three points at home and then write out the correct responses at home for next session.   09/14/24: Today PROM returned with scores above. 23/32-Ray  and 22/32-Karen, with higher scores indicating less impact of speech on pt's communicative situations.  Today SLP used pt's 10 facts about [employer] to target clarity of expressive language with written support. SLP also used occasional mod cues to assist pt in communicating his idea more accurately. Written cues were helpful for Ray. Usually, pt was unable to recall words he had just verbalized 10-15 seconds prior, when cued by SLP to write down the spoken thought. Homework to complete pursuit of happiness concept.  09/08/24: PROM to be brought back next session. Today SLP worked with pt to develop word finding and accuracy with verbal expression with divergent naming task. With mod complex categories pt able to generate average 6 items without cues. Pt noted to have more difficulty with spelling after 20 minutes. SLP educated pt/Karen about work-break cycle. Homework to write at least 10 2-sentence facts about the  [place] revolution that SLP might not know.    09/06/24: PROM not brought back. SLP provided another copy for [name] and pt to fill out and return next session. Based on pt/wife report last session, SLP worked with pt to develop scripts for [employer], and a baguette. SLP discussed with pt/wife how these could be helpful for more than just at restaurants - SLP suggested pt and [name] develop these on 3x5 cards to practice and take with him.[name] demonstrated understanding how to develop cards/scripts with pt today.   08/30/24: SLP introduced Online/app-based therapy and [employer] for home-based therapy tasks. See pt instructions. Education occurred for pt/wife around the neurological processes that occur with practice. Pt's wife reported a challenging situation is ordering at restaurants with pt difficulty with asking questions: are these real potatoes? Is there sugar in the cornbread? Pt would order half diet, half regular Coke, and prefers a baguette with his soup. Another situation is longer and deeper conversations, and a difficulty with pronouns. SLP to address these situations in upcoming sessions.   08/25/24: Needs PROM next session, and introduction to Talk Path therapy (and similar tech-based options) next session. Pt brought homework in, completed. SLP assisted pt with responsive naming task as pt's responses were not 100% the category targeted. Pt benefited from mod semantic cues, and rarely from phonemic cues. QAB completed today with pt. Scores above.in standardized assessments. SLP provided homework for pt until next session. Told him to work at least 20-30 minutes (or until mentally fatigued) as often as he likes during the day.   08/23/24: Pt needs QAB and PROM next session. 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 SLP ascertained that pt largely does not benefit from phonemic cues and shared this with Ray and [name]. Lastly, SLP talked briefly with pt and wife about past ST and tasks that SLP performed with Ray. SLP encouraged Ray to cont with those tasks at home for more practice.   PATIENT EDUCATION: Education details: see treatment date Person educated: Patient and Spouse Education method: Explanation, Demonstration, and Handouts Education comprehension: verbalized understanding and needs further education   GOALS: Goals reviewed with patient? No  SHORT TERM GOALS: Target date: 09/24/24  Pt will name 8 items in a simple-mod complex category, using trained compensations, 85% of opportunities   Baseline: Goal status: INITIAL  2.  Pt will demo use of trained compensatory strategies in 5 minutes simple conversation  x3 sessions Baseline:  Goal status: INITIAL  3.  Pt will develop a 1-minute presentation on topic of choice using trained compensations  Baseline:  Goal status: INITIAL  4.  Pt will deliver a 1-minute presentation using trained compensations Baseline:  Goal status: INITIAL  5.  Pt will demo use of speech to text for functional completion of 1-sentence text messages Baseline:  Goal status: INITIAL  6.  With trained compensations pt will complete billpay via telephone with 100% success in 2/2 opportunities Baseline:  Goal status: INITIAL  7.  Pt will complete QAB or other aphasia assessment Baseline:  Goal status: met  LONG TERM GOALS: Target date: 10/08/24  Pt will develop a 3-minute presentation about a topic of choice with mod I Baseline:  Goal status: INITIAL  2.  Pt will deliver a 3-minute presentation with mod I Baseline:  Goal status: INITIAL  3.  Pt will demo use of speech to text for functional completion of 2-sentence text messages   Baseline:  Goal status: INITIAL  4.  With trained compensations pt will complete billpay via telephone with  100% success in at least 2/2 opportunities Baseline:  Goal status: INITIAL  5.  Pt will implement trained compensations in 3 opportunities, between sessions, when he thinks he is getting anxious about making errors in verbal expression outside of ST Baseline:  Goal status: INITIAL  5.  Pt will improve PROM Baseline:  Goal status: INITIAL   ASSESSMENT:  CLINICAL IMPRESSION: Patient is a 59 y.o. M who was seen today for treatment of speech and language in light of hx of seizures and of tumor resection. See treatment date above for today's date for further details on today's session. Oral chemo was taken Thursday 09/16/24. Lt-sided brain tumor resected in August 2025 with radiation completed to the affected area. He has had a course of HHST since that sx. Functionally, pt was forced to retire recently, and has difficulty with verbal expression so that [name] (wife) needs to listen when pt pays bills via phone, and that pt uses pronouns interchangeably at times making tracking his conversation challenging at times. Lastly, his language declines when he experiences difficulty due to anxiety about making errors. [name] indicated pt is still working through some depression regarding his current situation. 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  OBJECTIVE IMPAIRMENTS: include aphasia. These impairments are limiting patient from managing medications, managing appointments, managing finances, household responsibilities, ADLs/IADLs, and effectively communicating at home and in community. Factors affecting potential to achieve goals and functional outcome are co-morbidities, medical prognosis, and severity of impairments. Patient will benefit from skilled SLP services to address above impairments and improve overall function.  REHAB POTENTIAL: Fair given above factors  PLAN:  SLP FREQUENCY: 2x/week  SLP DURATION: 6 weeks  PLANNED INTERVENTIONS:  Language facilitation, Environmental controls, Cueing hierachy, Internal/external aids, Functional tasks, Multimodal communication approach, SLP instruction and feedback, Compensatory strategies, Patient/family education, and [id number] Treatment of speech (30 or 45 min)     Alahni Varone, CCC-SLP 09/21/2024, 5:36 PM      "

## 2024-09-22 ENCOUNTER — Other Ambulatory Visit: Payer: Self-pay

## 2024-09-22 NOTE — Progress Notes (Signed)
 Clinical Intervention Note  Clinical Intervention Notes: Patient's wife called on call nurse over the weekend after patient had fallen and asked about taking lomustine  with Tylenol . Left VM x2 with wife to check and see if she still needed an answer to that question but wife did not return our call. There are no DDIs between lomustine  and Tylenol .   Clinical Intervention Outcomes: Prevention of an adverse drug event   Advertising Account Planner

## 2024-09-23 ENCOUNTER — Ambulatory Visit

## 2024-09-23 DIAGNOSIS — R4701 Aphasia: Secondary | ICD-10-CM | POA: Diagnosis not present

## 2024-09-23 NOTE — Therapy (Signed)
 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   Depression    Low testosterone  03/30/2013   Radiation 11/04/11-12/19/11   Left temporal brain 59.4 gray in 33 fractions   Skin cancer 09/09/1986   Past Surgical History:  Procedure Laterality Date   BACK SURGERY  09/09/1994   ruptured disc   CRANIOTOMY  09/04/2011   Procedure: CRANIOTOMY TUMOR EXCISION;  Surgeon: Catalina CHRISTELLA Stains;  Location: [facility];  Service: Neurosurgery;  Laterality: Left;  Left Temporal craniectomy for tumor   MASS BIOPSY  09/05/2011   left temporal lobe   skin cancer removal  1988   Removed skin cancer to left chest   Patient Active Problem List   Diagnosis Date Noted   Stroke-like symptoms 09/22/2022   Seizure (HCC) 09/22/2022   Low testosterone  03/30/2013   Depression    Skin cancer    Radiation    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   Pt accompanied by: self   PERTINENT HISTORY: Pt has had a course of home health ST between 05-10-24 and today. VASLOW-07/12/24: 04/20/2021 Initial Diagnosis Atypical meningioma of brain  (CMS-HCC)  04/20/2021 Surgery Resection of left frontal convexity mass with Peggye Li. Pathology: atypical meningioma, WHO grade II. TERT negative.  05/31/2021 - 07/16/2021 Radiation IMRT to the left frontal convexity resected typical meningioma total dose 57.6 Gy  07/31/2021 Radiology Study MRI post radiation for left frontal meningioma and for left temporal oligoastrocytoma surveillance. These two sites are stable. Small enhancing focus medial to left temporal resection cavity is apparent on today's scan comparable to July 2022. Will follow up with a short term MRI in another 6 weeks.   11/2022 - Chemotherapy  Tibsovo     04/29/24: Nodular progression left temporal; undergoes craniotomy, resection with Dr. Li.  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     06/04/24: Completes 25Gy/5 to resection site with Dr. 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 An MRI in October showed reduced inflammation and tumor size. He experiences transcortical motor aphasia, with increased difficulty in the afternoon, and a visual field defect with right superior quadrantanopia. No new seizures have occurred since surgery, indicating effective tumor control. Continue the current antiepileptic regimen and speech therapy.  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 The current antiepileptic regimen includes Lacosamide , Levetiracetam , Clonazepam, and Clobazam , with some noted fatigue but no significant side effects. Seizure control is prioritized over  potential medication side effects. Continue the current antiepileptic regimen and monitor for seizure recurrence. Consider weaning medications if seizure-free for six months.   Transcortical motor aphasia   Post-surgical language difficulties are characterized by transcortical motor aphasia, with word finding and substitution issues, particularly in the afternoon. Speech therapy is ongoing. Continue speech therapy.   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   Gait disturbance   A slightly broad-based stooped gait is more pronounced with increased activity, likely related to post-surgical changes. No concerning features like Parkinsonism are present. Continue to monitor gait changes.  PAIN:  Are you having pain? No  FALLS: Has patient fallen in last 6 months?  No   PATIENT GOALS: Improve to the point of being able to work  OBJECTIVE:  Note: Objective measures were completed at Evaluation unless otherwise noted.   ORAL MOTOR EXAMINATION: Overall status: Did not assess due to time - will assess next session  STANDARDIZED ASSESSMENTS: B[place]ON NAMING TEST: (BNT-2): Pt scored 15/30 with odds presentation of BNT-2 which is >2 standard deviations below the mean.         QUICK APHASIA BATTERY - 08/25/24 Summary      Word comprehension 10.00  Sentence comprehension 7.92  Word finding 5.75  Grammatical construction 5.50  Speech motor programming 10.00  Repetition 7.92  Reading 8.75  QAB overall 7.50   Pt's performance with this assessment indicates a mild to moderate aphasia mainly with pt's expressive language. Strength is reading and word and sentence comprehension. PT's conversational speech includes omitted content words, some semantic paraphasias, anomia fostering intermittently successful compensations. QAB overall score Severity 0.00-4.99  severe 5.00-7.49  moderate 7.50-8.89  mild 8.90-10.00  no aphasia   PATIENT  REPORTED OUTCOME MEASURES (PROM):  RETURNED 09/14/24 Communication Effectiveness Survey - KAREN        How effective is your speech... Pt Rating  Having a conversation with a family member or friends at home 3  Participating in conversation with strangers in a quiet place  3  Conversing with a familiar person over the telephone 3  Conversing with a stranger over the telephone 2  Being part of a conversation in a noisy environment  2  Speaking to a friend when you are emotionally upset or angry 3  Having a conversation while traveling in the car 3  Having a conversation with someone at a distance 3                                                                                               Total:  22   1= not at all effective  4= very effective    Communication Effectiveness Survey - RAY        How effective is your speech... Pt Rating  Having a conversation with a family member or friends at home 3  Participating in conversation with strangers in a quiet place  4  Conversing with a familiar person over the telephone 3  Conversing with a stranger over the telephone 3  Being part of a conversation in a noisy environment  2  Speaking to a friend when you are emotionally upset or angry 3  Having a conversation while traveling in the car 3  Having a conversation with someone at a distance 2                                                                                               Total:  23/32   1= not at all effective                                                                      4= very effective                                                                                                                             TREATMENT DATE:   09/23/24: BILLPAY compensations reviewed next session.Today SLP worked with pt's error awareness and generating proper syntax with his rewritten sentences. Pt noted errors 90% of  the time, and req'd mod-max cues from SLP to correct. Pt noted to fatigue after 25 minutes of task. Ray rewrote a thought about a topic which had good syntax, as his last thought. Pt to rewrite his thoughts for next session and read them to SLP and wife.  09/21/24: TALK ABOUT BILLPAY COMPENSATIONS NEXT SESSION. SLP assisted pt correct 2 of his last 3 homework selections from previous session Pt noted his errors 100%, but SLP necessary to provide mod-max cues for how to fix these errors. Pt omitted functor words and used near-correct vocabulary. Pt fatigue noted as session progressed as he initially made more suggestions to correct errors than after 35 minutes into the session. SLP observed (and [name] indicated) [name] was taking notes how to assist pt at home.   09/16/24:Talk about billpay compensations next session, with [name] present. Today in prep for pt speaking for one minute about a  topic of choice, SLP and pt corrected pt's grammar and his wording. When pt read what he wrote prior to last session he demonstrated understanding that it did not make sense but req'd consistent mod-max cues for problem solving how to correct the phrasing. Pt to correct the last three points at home and then write out the correct responses at home for next session.   09/14/24: Today PROM returned with scores above. 23/32-Ray and 22/32-Karen, with higher scores indicating less impact of speech on pt's communicative situations.  Today SLP used pt's 10 facts about [employer] to target clarity of expressive language with written support. SLP also used occasional mod cues to assist pt in communicating his idea more accurately. Written cues were helpful for Ray. Usually, pt was unable to recall words he had just verbalized 10-15 seconds prior, when cued by SLP to write down the spoken thought. Homework to complete pursuit of happiness concept.  09/08/24: PROM to be brought back next session. Today SLP worked with pt to  develop word finding and accuracy with verbal expression with divergent naming task. With mod complex categories pt able to generate average 6 items without cues. Pt noted to have more difficulty with spelling after 20 minutes. SLP educated pt/Karen about work-break cycle. Homework to write at least 10 2-sentence facts about the [employer] revolution that SLP might not know.    09/06/24: PROM not brought back. SLP provided another copy for [name] and pt to fill out and return next session. Based on pt/wife report last session, SLP worked with pt to develop scripts for [employer], and a baguette. SLP discussed with pt/wife how these could be helpful for more than just at restaurants - SLP suggested pt and [name] develop these on 3x5 cards to practice and take with him.[name] demonstrated understanding how to develop cards/scripts with pt today.   08/30/24: SLP introduced Online/app-based therapy and [employer] for home-based therapy tasks. See pt instructions. Education occurred for pt/wife around the neurological processes that occur with practice. Pt's wife reported a challenging situation is ordering at restaurants with pt difficulty with asking questions: are these real potatoes? Is there sugar in the cornbread? Pt would order half diet, half regular Coke, and prefers a baguette with his soup. Another situation is longer and deeper conversations, and a difficulty with pronouns. SLP to address these situations in upcoming sessions.   08/25/24: Needs PROM next session, and introduction to Talk Path therapy (and similar tech-based options) next session. Pt brought homework in, completed. SLP assisted pt with responsive naming task as pt's responses were not 100% the category targeted. Pt benefited from mod semantic cues, and rarely from phonemic cues. QAB completed today with pt. Scores above.in standardized assessments. SLP provided homework for pt until next session. Told him to work at least  20-30 minutes (or until mentally fatigued) as often as he likes during the day.   08/23/24: Pt needs QAB and PROM next session. 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 SLP ascertained that pt largely does not benefit from phonemic cues and shared this with Ray and [name]. Lastly, SLP talked briefly with pt and wife about past ST and tasks that SLP performed with Ray. SLP encouraged Ray to cont with those tasks at home for more practice.   PATIENT EDUCATION: Education details: see  treatment date Person educated: Patient and Spouse Education method: Explanation, Demonstration, and Handouts Education comprehension: verbalized understanding and needs further education   GOALS: Goals reviewed with patient? No  SHORT TERM GOALS: Target date: 09/24/24  Pt will name 8 items in a simple-mod complex category, using trained compensations, 85% of opportunities   Baseline: Goal status: deferred to work on more salient things for pt  2.  Pt will demo use of trained compensatory strategies in 5 minutes simple conversation x3 sessions Baseline:  Goal status: deferred to work on more salient things for pt  3.  Pt will develop a 1-minute presentation on topic of choice using trained compensations  Baseline:  Goal status: partially met  4.  Pt will deliver a 1-minute presentation using trained compensations Baseline:  Goal status: deferred one week  5.  Pt will demo use of speech to text for functional completion of 1-sentence text messages Baseline:  Goal status: deferred to work on salient things for pt  6.  With trained compensations pt will complete billpay via telephone with 100% success in 2/2 opportunities Baseline:  Goal status: deferred one week  7.  Pt will complete QAB or other aphasia assessment Baseline:  Goal  status: met  LONG TERM GOALS: Target date: 10/08/24  Pt will develop a 3-minute presentation about a topic of choice with mod I Baseline:  Goal status: INITIAL  2.  Pt will deliver a 3-minute presentation with mod I Baseline:  Goal status: INITIAL  3.  Pt will demo use of speech to text for functional completion of 2-sentence text messages   Baseline:  Goal status: INITIAL  4.  With trained compensations pt will complete billpay via telephone with 100% success in at least 2/2 opportunities Baseline:  Goal status: INITIAL  5.  Pt will implement trained compensations in 3 opportunities, between sessions, when he thinks he is getting anxious about making errors in verbal expression outside of ST Baseline:  Goal status: INITIAL  5.  Pt will improve PROM Baseline:  Goal status: INITIAL   ASSESSMENT:  CLINICAL IMPRESSION: STG check today. Many goals were deferred to work on more salient topics/tasks for maximizing pt's high-use vocabulary. Patient is a 59 y.o. M who was seen today for treatment of speech and language in light of hx of seizures and of tumor resection. See treatment date above for today's date for further details on today's session. Oral chemo was taken Thursday 09/16/24. Lt-sided brain tumor resected in August 2025 with radiation completed to the affected area. He has had a course of HHST since that sx. Functionally, pt was forced to retire recently, and has difficulty with verbal expression so that [name] (wife) needs to listen when pt pays bills via phone, and that pt uses pronouns interchangeably at times making tracking his conversation challenging at times. Lastly, his language declines when he experiences difficulty due to anxiety about making errors. [name] indicated pt is still working through some depression regarding his current situation. 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  OBJECTIVE IMPAIRMENTS: include  aphasia. These impairments are limiting patient from managing medications, managing appointments, managing finances, household responsibilities, ADLs/IADLs, and effectively communicating at home and in community. Factors affecting potential to achieve goals and functional outcome are co-morbidities, medical prognosis, and severity of impairments. Patient will benefit from skilled SLP services to address above impairments and improve overall function.  REHAB POTENTIAL: Fair given above factors  PLAN:  SLP FREQUENCY: 2x/week  SLP DURATION: 6 weeks  PLANNED INTERVENTIONS: Language facilitation, Environmental controls, Cueing hierachy, Internal/external aids, Functional tasks, Multimodal communication approach, SLP instruction and feedback, Compensatory strategies, Patient/family education, and [id number] Treatment of speech (30 or 45 min)     Dailyn Kempner, CCC-SLP 09/23/2024, 5:56 PM      "

## 2024-09-28 ENCOUNTER — Ambulatory Visit

## 2024-09-28 DIAGNOSIS — R4701 Aphasia: Secondary | ICD-10-CM | POA: Diagnosis not present

## 2024-09-28 NOTE — Therapy (Signed)
 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   Depression    Low testosterone  03/30/2013   Radiation 11/04/11-12/19/11   Left temporal brain 59.4 gray in 33 fractions   Skin cancer 09/09/1986   Past Surgical History:  Procedure Laterality Date   BACK SURGERY  09/09/1994   ruptured disc   CRANIOTOMY  09/04/2011   Procedure: CRANIOTOMY TUMOR EXCISION;  Surgeon: Catalina CHRISTELLA Stains;  Location: [facility];  Service: Neurosurgery;  Laterality: Left;  Left Temporal craniectomy for tumor   MASS BIOPSY  09/05/2011   left temporal lobe   skin cancer removal  1988   Removed skin cancer to left chest   Patient Active Problem List   Diagnosis Date Noted   Stroke-like symptoms 09/22/2022   Seizure (HCC) 09/22/2022   Low testosterone  03/30/2013   Depression    Skin cancer    Radiation    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   Pt accompanied by: self   PERTINENT HISTORY: Pt has had a course of home  health ST between 05-10-24 and today. VASLOW-07/12/24: 04/20/2021 Initial Diagnosis Atypical meningioma of brain (CMS-HCC)  04/20/2021 Surgery Resection of left frontal convexity mass with Peggye Li. Pathology: atypical meningioma, WHO grade II. TERT negative.  05/31/2021 - 07/16/2021 Radiation IMRT to the left frontal convexity resected typical meningioma total dose 57.6 Gy  07/31/2021 Radiology Study MRI post radiation for left frontal meningioma and for left temporal oligoastrocytoma surveillance. These two sites are stable. Small enhancing focus medial to left temporal resection cavity is apparent on today's scan comparable to July 2022. Will follow up with a short term MRI in another 6 weeks.   11/2022 - Chemotherapy  Tibsovo     04/29/24: Nodular progression left temporal; undergoes craniotomy, resection with Dr. Li.  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     06/04/24: Completes 25Gy/5 to resection site with Dr. 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 An MRI in October showed reduced inflammation and tumor size. He experiences transcortical motor aphasia, with increased difficulty in the afternoon, and a visual field defect with right superior quadrantanopia.  No new seizures have occurred since surgery, indicating effective tumor control. Continue the current antiepileptic regimen and speech therapy.   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 The current antiepileptic regimen includes Lacosamide , Levetiracetam , Clonazepam,  and Clobazam , with some noted fatigue but no significant side effects. Seizure control is prioritized over potential medication side effects. Continue the current antiepileptic regimen and monitor for seizure recurrence. Consider weaning medications if seizure-free for six months.   Transcortical motor aphasia   Post-surgical language difficulties are characterized by transcortical motor aphasia, with word finding and substitution issues, particularly in the afternoon. Speech therapy is ongoing. Continue speech therapy.   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   Gait disturbance   A slightly broad-based stooped gait is more pronounced with increased activity, likely related to post-surgical changes. No concerning features like Parkinsonism are present. Continue to monitor gait changes.  PAIN:  Are you having pain? No  FALLS: Has patient fallen in last 6 months?  No   PATIENT GOALS: Improve to the point of being able to work  OBJECTIVE:  Note: Objective measures were completed at Evaluation unless otherwise noted.   ORAL MOTOR EXAMINATION: Overall status: Did not assess due to time - will assess next session  STANDARDIZED ASSESSMENTS: B[place]ON NAMING TEST: (BNT-2): Pt scored 15/30 with odds presentation of BNT-2 which is >2 standard deviations below the mean.         QUICK APHASIA BATTERY - 08/25/24 Summary      Word comprehension 10.00  Sentence comprehension 7.92  Word finding 5.75  Grammatical construction 5.50  Speech motor programming 10.00  Repetition 7.92  Reading 8.75  QAB overall 7.50   Pt's performance with this assessment indicates a mild to moderate aphasia mainly with pt's expressive language. Strength is reading and word and sentence comprehension. PT's conversational speech includes omitted content words, some semantic paraphasias, anomia fostering intermittently successful compensations. QAB overall  score Severity 0.00-4.99  severe 5.00-7.49  moderate 7.50-8.89  mild 8.90-10.00  no aphasia   PATIENT REPORTED OUTCOME MEASURES (PROM):  RETURNED 09/14/24 Communication Effectiveness Survey - KAREN        How effective is your speech... Pt Rating  Having a conversation with a family member or friends at home 3  Participating in conversation with strangers in a quiet place  3  Conversing with a familiar person over the telephone 3  Conversing with a stranger over the telephone 2  Being part of a conversation in a noisy environment  2  Speaking to a friend when you are emotionally upset or angry 3  Having a conversation while traveling in the car 3  Having a conversation with someone at a distance 3                                                                                               Total:  22   1= not at all effective  4= very effective    Communication Effectiveness Survey - RAY        How effective is your speech... Pt Rating  Having a conversation with a family member or friends at home 3  Participating in conversation with strangers in a quiet place  4  Conversing with a familiar person over the telephone 3  Conversing with a stranger over the telephone 3  Being part of a conversation in a noisy environment  2  Speaking to a friend when you are emotionally upset or angry 3  Having a conversation while traveling in the car 3  Having a conversation with someone at a distance 2                                                                                               Total:  23/32   1= not at all effective                                                                      4= very effective                                                                                                                             TREATMENT DATE:   09/28/24: Pt did not bring written work today to give to SLP verbally  for his one-minute verbal presentation. SLP asked pt to make sure he brings next session. SLP assisted pt with ways to ensure he has correct information passed along to wife and to customer service reps for billpay on the phone. SLP discovered that pt has difficulty communicating numbers (times) with [name]. SLP suggested since reading is a relative strength of pt, that he read off appointment times as much as possible instead of saying them spontaneously which leads to more frequent misunderstandings. SLP also suggested pt read information off to *people* over the phone as much as he can, instead of using automated systems which he cannot request a repeat to verify he understands the automated instructions. SLP further suggested to pt and wife that Ray keep text messages simple and not try to say too much because that gives him more opportunity for error.  09/23/24: BILLPAY compensations reviewed next session.Today SLP worked with pt's error awareness and generating proper syntax with his rewritten sentences. Pt noted  errors 90% of the time, and req'd mod-max cues from SLP to correct. Pt noted to fatigue after 25 minutes of task. Ray rewrote a thought about a topic which had good syntax, as his last thought. Pt to rewrite his thoughts for next session and read them to SLP and wife.  09/21/24: TALK ABOUT BILLPAY COMPENSATIONS NEXT SESSION. SLP assisted pt correct 2 of his last 3 homework selections from previous session Pt noted his errors 100%, but SLP necessary to provide mod-max cues for how to fix these errors. Pt omitted functor words and used near-correct vocabulary. Pt fatigue noted as session progressed as he initially made more suggestions to correct errors than after 35 minutes into the session. SLP observed (and [name] indicated) [name] was taking notes how to assist pt at home.   09/16/24:Talk about billpay compensations next session, with [name] present. Today in prep for pt speaking for one minute  about a topic of choice, SLP and pt corrected pt's grammar and his wording. When pt read what he wrote prior to last session he demonstrated understanding that it did not make sense but req'd consistent mod-max cues for problem solving how to correct the phrasing. Pt to correct the last three points at home and then write out the correct responses at home for next session.   09/14/24: Today PROM returned with scores above. 23/32-Ray and 22/32-Karen, with higher scores indicating less impact of speech on pt's communicative situations.  Today SLP used pt's 10 facts about [employer] to target clarity of expressive language with written support. SLP also used occasional mod cues to assist pt in communicating his idea more accurately. Written cues were helpful for Ray. Usually, pt was unable to recall words he had just verbalized 10-15 seconds prior, when cued by SLP to write down the spoken thought. Homework to complete pursuit of happiness concept.  09/08/24: PROM to be brought back next session. Today SLP worked with pt to develop word finding and accuracy with verbal expression with divergent naming task. With mod complex categories pt able to generate average 6 items without cues. Pt noted to have more difficulty with spelling after 20 minutes. SLP educated pt/Karen about work-break cycle. Homework to write at least 10 2-sentence facts about the [employer] revolution that SLP might not know.    09/06/24: PROM not brought back. SLP provided another copy for [name] and pt to fill out and return next session. Based on pt/wife report last session, SLP worked with pt to develop scripts for [employer], and a baguette. SLP discussed with pt/wife how these could be helpful for more than just at restaurants - SLP suggested pt and [name] develop these on 3x5 cards to practice and take with him.[name] demonstrated understanding how to develop cards/scripts with pt today.   08/30/24: SLP introduced  Online/app-based therapy and [employer] for home-based therapy tasks. See pt instructions. Education occurred for pt/wife around the neurological processes that occur with practice. Pt's wife reported a challenging situation is ordering at restaurants with pt difficulty with asking questions: are these real potatoes? Is there sugar in the cornbread? Pt would order half diet, half regular Coke, and prefers a baguette with his soup. Another situation is longer and deeper conversations, and a difficulty with pronouns. SLP to address these situations in upcoming sessions.   08/25/24: Needs PROM next session, and introduction to Talk [employer] therapy (and similar tech-based options) next session. Pt brought homework in, completed. SLP assisted pt with responsive naming task  as pt's responses were not 100% the category targeted. Pt benefited from mod semantic cues, and rarely from phonemic cues. QAB completed today with pt. Scores above.in standardized assessments. SLP provided homework for pt until next session. Told him to work at least 20-30 minutes (or until mentally fatigued) as often as he likes during the day.   08/23/24: Pt needs QAB and PROM next session. 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 SLP ascertained that pt largely does not benefit from phonemic cues and shared this with Ray and [name]. Lastly, SLP talked briefly with pt and wife about past ST and tasks that SLP performed with Ray. SLP encouraged Ray to cont with those tasks at home for more practice.   PATIENT EDUCATION: Education details: see treatment date Person educated: Patient and Spouse Education method: Explanation, Demonstration, and Handouts Education comprehension: verbalized understanding and needs further education   GOALS: Goals reviewed  with patient? No  SHORT TERM GOALS: Target date: 09/24/24  Pt will name 8 items in a simple-mod complex category, using trained compensations, 85% of opportunities   Baseline: Goal status: deferred to work on more salient things for pt  2.  Pt will demo use of trained compensatory strategies in 5 minutes simple conversation x3 sessions Baseline:  Goal status: deferred to work on more salient things for pt  3.  Pt will develop a 1-minute presentation on topic of choice using trained compensations  Baseline:  Goal status: partially met  4.  Pt will deliver a 1-minute presentation using trained compensations Baseline:  Goal status: deferred one week  5.  Pt will demo use of speech to text for functional completion of 1-sentence text messages Baseline:  Goal status: deferred to work on salient things for pt  6.  With trained compensations pt will complete billpay via telephone with 100% success in 2/2 opportunities Baseline:  Goal status: deferred due to pt reads billpay amounts accurately  7.  Pt will complete QAB or other aphasia assessment Baseline:  Goal status: met  LONG TERM GOALS: Target date: 10/08/24  Pt will develop a 3-minute presentation about a topic of choice with mod I Baseline:  Goal status: INITIAL  2.  Pt will deliver a 3-minute presentation with mod I Baseline:  Goal status: INITIAL  3.  Pt will demo use of speech to text for functional completion of 2-sentence text messages   Baseline:  Goal status: INITIAL  4.  With trained compensations pt will complete billpay via telephone with 100% success in at least 2/2 opportunities Baseline:  Goal status: INITIAL  5.  Pt will implement trained compensations in 3 opportunities, between sessions, when he thinks he is getting anxious about making errors in verbal expression outside of ST Baseline:  Goal status: INITIAL  5.  Pt will improve PROM Baseline:  Goal status: INITIAL   ASSESSMENT:  CLINICAL  IMPRESSION: Patient is a 59 y.o. M who was seen today for treatment of speech and language in light of hx of seizures and of tumor resection. See treatment date above for today's date for further details on today's session. Oral chemo was taken Thursday 09/16/24. Lt-sided brain tumor resected in August 2025 with radiation completed to the affected area. He has had a course of HHST since that sx. Functionally, pt was forced to retire recently, and  has difficulty with verbal expression so that [name] (wife) needs to listen when pt pays bills via phone, and that pt uses pronouns interchangeably at times making tracking his conversation challenging at times. Lastly, his language declines when he experiences difficulty due to anxiety about making errors. [name] indicated pt is still working through some depression regarding his current situation. 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  OBJECTIVE IMPAIRMENTS: include aphasia. These impairments are limiting patient from managing medications, managing appointments, managing finances, household responsibilities, ADLs/IADLs, and effectively communicating at home and in community. Factors affecting potential to achieve goals and functional outcome are co-morbidities, medical prognosis, and severity of impairments. Patient will benefit from skilled SLP services to address above impairments and improve overall function.  REHAB POTENTIAL: Fair given above factors  PLAN:  SLP FREQUENCY: 2x/week  SLP DURATION: 6 weeks  PLANNED INTERVENTIONS: Language facilitation, Environmental controls, Cueing hierachy, Internal/external aids, Functional tasks, Multimodal communication approach, SLP instruction and feedback, Compensatory strategies, Patient/family education, and [id number] Treatment of speech (30 or 45 min)     Aarian Cleaver, CCC-SLP 09/28/2024, 4:20 PM      "

## 2024-09-29 ENCOUNTER — Other Ambulatory Visit: Payer: Self-pay

## 2024-09-30 ENCOUNTER — Inpatient Hospital Stay

## 2024-09-30 ENCOUNTER — Ambulatory Visit

## 2024-09-30 DIAGNOSIS — C719 Malignant neoplasm of brain, unspecified: Secondary | ICD-10-CM

## 2024-09-30 DIAGNOSIS — R4701 Aphasia: Secondary | ICD-10-CM

## 2024-09-30 LAB — CBC WITH DIFFERENTIAL (CANCER CENTER ONLY)
Abs Immature Granulocytes: 0.01 K/uL (ref 0.00–0.07)
Basophils Absolute: 0 K/uL (ref 0.0–0.1)
Basophils Relative: 0 %
Eosinophils Absolute: 0 K/uL (ref 0.0–0.5)
Eosinophils Relative: 0 %
HCT: 44.2 % (ref 39.0–52.0)
Hemoglobin: 15.5 g/dL (ref 13.0–17.0)
Immature Granulocytes: 0 %
Lymphocytes Relative: 20 %
Lymphs Abs: 1 K/uL (ref 0.7–4.0)
MCH: 32 pg (ref 26.0–34.0)
MCHC: 35.1 g/dL (ref 30.0–36.0)
MCV: 91.1 fL (ref 80.0–100.0)
Monocytes Absolute: 0.6 K/uL (ref 0.1–1.0)
Monocytes Relative: 12 %
Neutro Abs: 3.4 K/uL (ref 1.7–7.7)
Neutrophils Relative %: 68 %
Platelet Count: 147 K/uL — ABNORMAL LOW (ref 150–400)
RBC: 4.85 MIL/uL (ref 4.22–5.81)
RDW: 14.8 % (ref 11.5–15.5)
WBC Count: 5 K/uL (ref 4.0–10.5)
nRBC: 0 % (ref 0.0–0.2)

## 2024-09-30 LAB — CMP (CANCER CENTER ONLY)
ALT: 36 U/L (ref 0–44)
AST: 24 U/L (ref 15–41)
Albumin: 4.6 g/dL (ref 3.5–5.0)
Alkaline Phosphatase: 97 U/L (ref 38–126)
Anion gap: 12 (ref 5–15)
BUN: 18 mg/dL (ref 6–20)
CO2: 25 mmol/L (ref 22–32)
Calcium: 10 mg/dL (ref 8.9–10.3)
Chloride: 106 mmol/L (ref 98–111)
Creatinine: 1.11 mg/dL (ref 0.61–1.24)
GFR, Estimated: >60 mL/min
Glucose, Bld: 100 mg/dL — ABNORMAL HIGH (ref 70–99)
Potassium: 4.3 mmol/L (ref 3.5–5.1)
Sodium: 143 mmol/L (ref 135–145)
Total Bilirubin: 0.3 mg/dL (ref 0.0–1.2)
Total Protein: 7.3 g/dL (ref 6.5–8.1)

## 2024-09-30 NOTE — Therapy (Signed)
 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   Depression    Low testosterone  03/30/2013   Radiation 11/04/11-12/19/11   Left temporal brain 59.4 gray in 33 fractions   Skin cancer 09/09/1986   Past Surgical History:  Procedure Laterality Date   BACK SURGERY  09/09/1994   ruptured disc   CRANIOTOMY  09/04/2011   Procedure: CRANIOTOMY TUMOR EXCISION;  Surgeon: Catalina CHRISTELLA Stains;  Location: [facility];  Service: Neurosurgery;  Laterality: Left;  Left Temporal craniectomy for tumor   MASS BIOPSY  09/05/2011   left temporal lobe   skin cancer removal  1988   Removed skin cancer to left chest   Patient Active Problem List   Diagnosis Date Noted   Stroke-like symptoms 09/22/2022   Seizure (HCC) 09/22/2022   Low testosterone  03/30/2013   Depression    Skin cancer    Radiation    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 -jokingly, to Nathan Prince   Pt accompanied by: self   PERTINENT HISTORY: Pt has had a course of home health ST between 05-10-24 and today. VASLOW-07/12/24: 04/20/2021 Initial Diagnosis Atypical  meningioma of brain (CMS-HCC)  04/20/2021 Surgery Resection of left frontal convexity mass with Peggye Li. Pathology: atypical meningioma, WHO grade II. TERT negative.  05/31/2021 - 07/16/2021 Radiation IMRT to the left frontal convexity resected typical meningioma total dose 57.6 Gy  07/31/2021 Radiology Study MRI post radiation for left frontal meningioma and for left temporal oligoastrocytoma surveillance. These two sites are stable. Small enhancing focus medial to left temporal resection cavity is apparent on today's scan comparable to July 2022. Will follow up with a short term MRI in another 6 weeks.   11/2022 - Chemotherapy  Tibsovo     04/29/24: Nodular progression left temporal; undergoes craniotomy, resection with Dr. Li.  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     06/04/24: Completes 25Gy/5 to resection site with Dr. 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 An MRI in October showed reduced inflammation and tumor size. He experiences transcortical motor aphasia, with increased difficulty in the afternoon, and a visual field defect with right superior quadrantanopia. No new seizures have occurred since surgery, indicating effective tumor control. Continue the current antiepileptic regimen and  speech therapy.   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 The current antiepileptic regimen includes Lacosamide , Levetiracetam , Clonazepam, and Clobazam , with some noted fatigue but no significant side effects. Seizure control is  prioritized over potential medication side effects. Continue the current antiepileptic regimen and monitor for seizure recurrence. Consider weaning medications if seizure-free for six months.   Transcortical motor aphasia   Post-surgical language difficulties are characterized by transcortical motor aphasia, with word finding and substitution issues, particularly in the afternoon. Speech therapy is ongoing. Continue speech therapy.   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   Gait disturbance   A slightly broad-based stooped gait is more pronounced with increased activity, likely related to post-surgical changes. No concerning features like Parkinsonism are present. Continue to monitor gait changes.  PAIN:  Are you having pain? No  FALLS: Has patient fallen in last 6 months?  No   PATIENT GOALS: Improve to the point of being able to work  OBJECTIVE:  Note: Objective measures were completed at Evaluation unless otherwise noted.   ORAL MOTOR EXAMINATION: Overall status: Did not assess due to time - will assess next session  STANDARDIZED ASSESSMENTS: B[place]ON NAMING TEST: (BNT-2): Pt scored 15/30 with odds presentation of BNT-2 which is >2 standard deviations below the mean.         QUICK APHASIA BATTERY - 08/25/24 Summary      Word comprehension 10.00  Sentence comprehension 7.92  Word finding 5.75  Grammatical construction 5.50  Speech motor programming 10.00  Repetition 7.92  Reading 8.75  QAB overall 7.50   Pt's performance with this assessment indicates a mild to moderate aphasia mainly with pt's expressive language. Strength is reading and word and sentence comprehension. PT's conversational speech includes omitted content words, some semantic paraphasias, anomia fostering intermittently successful compensations. QAB overall score Severity 0.00-4.99  severe 5.00-7.49  moderate 7.50-8.89  mild 8.90-10.00  no  aphasia   PATIENT REPORTED OUTCOME MEASURES (PROM):  RETURNED 09/14/24 Communication Effectiveness Survey - Nathan Prince        How effective is your speech... Pt Rating  Having a conversation with a family member or friends at home 3  Participating in conversation with strangers in a quiet place  3  Conversing with a familiar person over the telephone 3  Conversing with a stranger over the telephone 2  Being part of a conversation in a noisy environment  2  Speaking to a friend when you are emotionally upset or angry 3  Having a conversation while traveling in the car 3  Having a conversation with someone at a distance 3                                                                                               Total:  22   1= not at all effective  4= very effective    Communication Effectiveness Survey - Nathan Prince        How effective is your speech... Pt Rating  Having a conversation with a family member or friends at home 3  Participating in conversation with strangers in a quiet place  4  Conversing with a familiar person over the telephone 3  Conversing with a stranger over the telephone 3  Being part of a conversation in a noisy environment  2  Speaking to a friend when you are emotionally upset or angry 3  Having a conversation while traveling in the car 3  Having a conversation with someone at a distance 2                                                                                               Total:  23/32   1= not at all effective                                                                      4= very effective                                                                                                                             TREATMENT DATE:   09/30/24: Pt again did not bring written work to present to SLP so SLP concentrated with pt on written tasks with some syntax scaffolding for word selection  and proper syntax. Pt req'd mod A usually. SLP noted pt fatigue after approx 20-25 minutes. He unscrambled sentences with first word cue (8-10 words) with usual mod cues. SLP provided homework for pt for completion of today's tasks.  09/28/24: Pt did not bring written work today to give to SLP verbally for his one-minute verbal presentation. SLP asked pt to make sure he brings next session. SLP assisted pt with ways to ensure he has correct information passed along to wife and to customer service reps for billpay on the phone. SLP discovered that pt has difficulty communicating numbers (times) with Nathan Prince. SLP suggested since reading is a relative strength of pt, that he read off appointment times as much as possible instead of saying them spontaneously which leads to more frequent misunderstandings. SLP also suggested pt read information off to *people* over the phone as much as he can, instead of  using automated systems which he cannot request a repeat to verify he understands the automated instructions. SLP further suggested to pt and wife that Nathan Prince keep text messages simple and not try to say too much because that gives him more opportunity for error.  09/23/24: BILLPAY compensations reviewed next session.Today SLP worked with pt's error awareness and generating proper syntax with his rewritten sentences. Pt noted errors 90% of the time, and req'd mod-max cues from SLP to correct. Pt noted to fatigue after 25 minutes of task. Nathan Prince rewrote a thought about a topic which had good syntax, as his last thought. Pt to rewrite his thoughts for next session and read them to SLP and wife.  09/21/24: TALK ABOUT BILLPAY COMPENSATIONS NEXT SESSION. SLP assisted pt correct 2 of his last 3 homework selections from previous session Pt noted his errors 100%, but SLP necessary to provide mod-max cues for how to fix these errors. Pt omitted functor words and used near-correct vocabulary. Pt fatigue noted as session progressed as  he initially made more suggestions to correct errors than after 35 minutes into the session. SLP observed (and Nathan Prince indicated) Nathan Prince was taking notes how to assist pt at home.   09/16/24:Talk about billpay compensations next session, with Nathan Prince present. Today in prep for pt speaking for one minute about a topic of choice, SLP and pt corrected pt's grammar and his wording. When pt read what he wrote prior to last session he demonstrated understanding that it did not make sense but req'd consistent mod-max cues for problem solving how to correct the phrasing. Pt to correct the last three points at home and then write out the correct responses at home for next session.   09/14/24: Today PROM returned with scores above. 23/32-Nathan Prince and 22/32-Nathan Prince, with higher scores indicating less impact of speech on pt's communicative situations.  Today SLP used pt's 10 facts about [employer] to target clarity of expressive language with written support. SLP also used occasional mod cues to assist pt in communicating his idea more accurately. Written cues were helpful for Nathan Prince. Usually, pt was unable to recall words he had just verbalized 10-15 seconds prior, when cued by SLP to write down the spoken thought. Homework to complete pursuit of happiness concept.  09/08/24: PROM to be brought back next session. Today SLP worked with pt to develop word finding and accuracy with verbal expression with divergent naming task. With mod complex categories pt able to generate average 6 items without cues. Pt noted to have more difficulty with spelling after 20 minutes. SLP educated pt/Nathan Prince about work-break cycle. Homework to write at least 10 2-sentence facts about the [employer] revolution that SLP might not know.    09/06/24: PROM not brought back. SLP provided another copy for Nathan Prince and pt to fill out and return next session. Based on pt/wife report last session, SLP worked with pt to develop scripts for [employer], and a  baguette. SLP discussed with pt/wife how these could be helpful for more than just at restaurants - SLP suggested pt and Nathan Prince develop these on 3x5 cards to practice and take with him.Nathan Prince demonstrated understanding how to develop cards/scripts with pt today.   08/30/24: SLP introduced Online/app-based therapy and [employer] for home-based therapy tasks. See pt instructions. Education occurred for pt/wife around the neurological processes that occur with practice. Pt's wife reported a challenging situation is ordering at restaurants with pt difficulty with asking questions: are these real potatoes? Is there sugar in the  cornbread? Pt would order half diet, half regular Coke, and prefers a baguette with his soup. Another situation is longer and deeper conversations, and a difficulty with pronouns. SLP to address these situations in upcoming sessions.   08/25/24: Needs PROM next session, and introduction to Talk Path therapy (and similar tech-based options) next session. Pt brought homework in, completed. SLP assisted pt with responsive naming task as pt's responses were not 100% the category targeted. Pt benefited from mod semantic cues, and rarely from phonemic cues. QAB completed today with pt. Scores above.in standardized assessments. SLP provided homework for pt until next session. Told him to work at least 20-30 minutes (or until mentally fatigued) as often as he likes during the day.   08/23/24: Pt needs QAB and PROM next session. 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 SLP ascertained that pt largely does not benefit from phonemic cues and shared this with Nathan Prince and Nathan Prince. Lastly, SLP talked briefly with pt and wife about past ST and tasks that SLP performed with Nathan Prince. SLP encouraged Nathan Prince to cont with those tasks at  home for more practice.   PATIENT EDUCATION: Education details: see treatment date Person educated: Patient and Spouse Education method: Explanation, Demonstration, and Handouts Education comprehension: verbalized understanding and needs further education   GOALS: Goals reviewed with patient? No  SHORT TERM GOALS: Target date: 09/24/24  Pt will name 8 items in a simple-mod complex category, using trained compensations, 85% of opportunities   Baseline: Goal status: deferred to work on more salient things for pt  2.  Pt will demo use of trained compensatory strategies in 5 minutes simple conversation x3 sessions Baseline:  Goal status: deferred to work on more salient things for pt  3.  Pt will develop a 1-minute presentation on topic of choice using trained compensations  Baseline:  Goal status: partially met  4.  Pt will deliver a 1-minute presentation using trained compensations Baseline:  Goal status: deferred one week  5.  Pt will demo use of speech to text for functional completion of 1-sentence text messages Baseline:  Goal status: deferred to work on salient things for pt  6.  With trained compensations pt will complete billpay via telephone with 100% success in 2/2 opportunities Baseline:  Goal status: deferred due to pt reads billpay amounts accurately  7.  Pt will complete QAB or other aphasia assessment Baseline:  Goal status: met  LONG TERM GOALS: Target date: 10/08/24  Pt will develop a 3-minute presentation about a topic of choice with mod I Baseline:  Goal status: INITIAL  2.  Pt will deliver a 3-minute presentation with mod I Baseline:  Goal status: INITIAL  3.  Pt will demo use of speech to text for functional completion of 2-sentence text messages   Baseline:  Goal status: INITIAL  4.  With trained compensations pt will complete billpay via telephone with 100% success in at least 2/2 opportunities Baseline:  Goal status: INITIAL  5.  Pt  will implement trained compensations in 3 opportunities, between sessions, when he thinks he is getting anxious about making errors in verbal expression outside of ST Baseline:  Goal status: INITIAL  5.  Pt will improve PROM Baseline:  Goal status: INITIAL   ASSESSMENT:  CLINICAL IMPRESSION: Patient is a 59 y.o. M who was seen today  for treatment of speech and language in light of hx of seizures and of tumor resection. See treatment date above for today's date for further details on today's session. Oral chemo was taken Thursday 09/16/24. Lt-sided brain tumor resected in August 2025 with radiation completed to the affected area. He has had a course of HHST since that sx. Functionally, pt was forced to retire recently, and has difficulty with verbal expression so that Nathan Prince (wife) needs to listen when pt pays bills via phone, and that pt uses pronouns interchangeably at times making tracking his conversation challenging at times. Lastly, his language declines when he experiences difficulty due to anxiety about making errors. Nathan Prince indicated pt is still working through some depression regarding his current situation. 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  OBJECTIVE IMPAIRMENTS: include aphasia. These impairments are limiting patient from managing medications, managing appointments, managing finances, household responsibilities, ADLs/IADLs, and effectively communicating at home and in community. Factors affecting potential to achieve goals and functional outcome are co-morbidities, medical prognosis, and severity of impairments. Patient will benefit from skilled SLP services to address above impairments and improve overall function.  REHAB POTENTIAL: Fair given above factors  PLAN:  SLP FREQUENCY: 2x/week  SLP DURATION: 6 weeks  PLANNED INTERVENTIONS: Language facilitation, Environmental controls, Cueing hierachy, Internal/external aids,  Functional tasks, Multimodal communication approach, SLP instruction and feedback, Compensatory strategies, Patient/family education, and [id number] Treatment of speech (30 or 45 min)     Lan Mcneill, CCC-SLP 09/30/2024, 4:23 PM      "

## 2024-10-04 NOTE — Progress Notes (Signed)
 [facility] Psychiatry-Oncology   This video encounter was conducted with the patient's (or proxy's) verbal consent via secure, interactive audio and video telecommunications while in clinic/office/hospital.  The patient (or proxy) was instructed to have this encounter in a suitably private space and to only have persons present to whom they give permission to participate. In addition, patient identity was confirmed by use of name plus two identifiers.  2019/11/07 E&M) This visit was coded based on time. I spent a total of 25 minutes in both face-to-face and non-face-to-face activities for this visit on the date of this encounter.  Chief Complaint:   Nathan Prince is a 59 y.o. year old male w L temporal glioma and L frontal atypical meningioma who returns for follow-up medication management visit for the treatment of anxiety and depressed mood.  HPI/Subjective:    Pt and his wife join the session.  He reports doing good, overall, though he notes some days with more fatigue than others.  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  He admits to feeling overwhelmed.  Faith and prayer are important coping mechanisms.  No SI or passive death wishes.  He notes recent sleep disturbance with early (~3am) waking; he did start methylphenidate  on Dec 30.  Pt is tolerating current medication regimen.  His wife notes improvement on recently increased dose of SSRI with resolution of tearfulness and increased engagement; she notes that the counselor they see weekly saw the same improvements.  We discuss the importance of acknowledging all that he IS accomplishing even as he grieves the loss of what he had hoped to be doing at this point in his life.  Of note, pt's speech is much improved.  Objective:   Past Psychiatric Medication Trials: Sertraline  Trazodone Melatonin  Past Psychotherapy Trials: Weekly counseling  Dr  Raynor  Medications: Current Outpatient Medications  Medication Sig Dispense Refill   acetaminophen  (TYLENOL ) 325 MG tablet Take 3 tablets (975 mg total) by mouth every 6 (six) hours as needed 200 tablet 0   CALCIUM CARBONATE/VITAMIN D3 (CALCIUM 500 + D, D3, ORAL) Take 1 tablet by mouth 2 (two) times daily.     cloBAZam  (ONFI ) 10 mg tablet Take 1 tablet (10 mg total) by mouth at bedtime 30 tablet 5   lacoSAMide  (VIMPAT ) 200 mg tablet Take 1 tablet (200 mg total) by mouth every 12 (twelve) hours 60 tablet 5   levETIRAcetam  (KEPPRA  XR) 500 mg XR tablet Take 4 tablets (2,000 mg total) by mouth 2 (two) times daily 240 tablet 5   melatonin 3 mg tablet Take 2 tablets (6 mg total) by mouth at bedtime for 90 days (Patient taking differently: Take 5 mg by mouth at bedtime) 180 tablet 0   methylphenidate  HCl (RITALIN ) 5 MG tablet Take 1 tablet (5 mg total) by mouth once daily for 30 days 30 tablet 0   multivit with minerals/lutein (MULTIVITAMIN 50 PLUS ORAL) Take 1 tablet by mouth once daily     omeprazole (PRILOSEC) 20 MG DR capsule TAKE 1 CAPSULE BY MOUTH EVERY DAY 90 capsule 1   pyridoxine, vitamin B6, (VITAMIN B-6) 100 MG tablet Take 100 mg by mouth once daily     sertraline  (ZOLOFT ) 100 MG tablet Take 1.5 tablets (150 mg total) by mouth at bedtime for 90 days 135 tablet 0   traZODone (DESYREL) 50 MG tablet TAKE 0.5 TABLETS (25 MG TOTAL) BY MOUTH AT BEDTIME FOR 90 DAYS 45 tablet 0  No current facility-administered medications for this visit.   Mental Status Examination: *Some elements like gait were unable to be assessed due to type of visit (telehealth) General Appearance: Appropriately kempt, NAD Abnormal Movements: None Psychomotor: No psychomotor agitation or retardation Level of Consciousness: Alert Psychiatric Orientation: Grossly oriented in 3 spheres Attitude: Calm and cooperative Eye Contact: Good Speech: Expressive aphasia with word-finding difficulty (some  improvement) Mood: Not bad Affect: Euthymic Thought Process: Linear, goal-directed, future-oriented Thought Content :Denies SI, HI. No delusions elicited. No obsessions or compulsions. Perceptual Disturbances: Denies AH or VH. No illusions. Cognition: Mild deficits Attention: Fair Insight: Good Judgment: Intact  Assessment/Treatment Plan:    This is a 59 y.o. male w L temporal glioma and L frontal atypical meningioma presenting with low mood and anxiety related to dx and associated role changes.   VISIT DIAGNOSIS: Adjustment disorder with mixed anxiety and depressed mood  (primary encounter diagnosis)   Problem: Anxiety and existential distress Status: are worsening Plan: continue sertraline  150mg  PO qHS; continue meeting with Christian counselor   Problem: Insomnia Status:are improving Plan: continue trazodone 25mg  PO qHS and melatonin 6mg  PO qHS  Patient will return to clinic in 3 months, or sooner if needed.   I spent a total of 25 minutes in both face-to-face and non-face-to-face activities for this visit on the date of this encounter.  Risk factors for safety were reviewed with Nathan Prince and the patient is not at immediate risk for self-harm or harm to others.  The risks and benefits of psychopharmacological treatment have been reviewed with the patient in addition to common side effects. This plan has been reviewed and agreed to by Nathan Prince.  Patient Instructions:   I have reviewed this provider's instructions with the patient and his spouse, answering all questions to their satisfaction.  Attestation Statement:   I personally performed the service. (TP)  MEGAN MARYELIZABETH SLIDER, MD

## 2024-10-05 ENCOUNTER — Ambulatory Visit

## 2024-10-07 ENCOUNTER — Ambulatory Visit

## 2024-10-08 ENCOUNTER — Ambulatory Visit

## 2024-10-08 DIAGNOSIS — R4701 Aphasia: Secondary | ICD-10-CM

## 2024-10-08 NOTE — Therapy (Signed)
 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   Depression    Low testosterone  03/30/2013   Radiation 11/04/11-12/19/11   Left temporal brain 59.4 gray in 33 fractions   Skin cancer 09/09/1986   Past Surgical History:  Procedure Laterality Date   BACK SURGERY  09/09/1994   ruptured disc   CRANIOTOMY  09/04/2011   Procedure: CRANIOTOMY TUMOR EXCISION;  Surgeon: Catalina CHRISTELLA Stains;  Location: [facility];  Service: Neurosurgery;  Laterality: Left;  Left Temporal craniectomy for tumor   MASS BIOPSY  09/05/2011   left temporal lobe   skin cancer removal  1988   Removed skin cancer to left chest   Patient Active Problem List   Diagnosis Date Noted   Stroke-like symptoms 09/22/2022   Seizure (HCC) 09/22/2022   Low testosterone  03/30/2013   Depression    Skin cancer    Radiation    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  Objective: Thus far, pt's overall language output has improved however Nathan Prince still has difficulty producing speech/language around more complex topics and does not always demonstrate understanding of more complex questions and comments. SLP questions if pt's cognition in terms of attention and  awareness of deficits is WNL.  Goal Update: See below  Plan: See pt for approx 12 more visits. Pt may choose to d/c this clinic and pursue ST at [facility] in the future.  Reason Skilled Services are Required: Pt has not yet reached max potential.   ONSET DATE: After surgery 04/29/24; script 08/17/24   REFERRING DIAG: C71.9 (ICD-10-CM) - Astrocytoma (HCC) R47.01 (ICD-10-CM) - Aphasia  THERAPY DIAG:  Aphasia  Rationale for Evaluation and Treatment: Rehabilitation  SUBJECTIVE:   SUBJECTIVE STATEMENT: We might get a dog. -jokingly, to [name]   Pt accompanied by: self   PERTINENT HISTORY: Pt has had a course of home health ST between 05-10-24 and today. VASLOW-07/12/24: 04/20/2021 Initial Diagnosis Atypical meningioma of brain (CMS-HCC)  04/20/2021 Surgery Resection of left frontal convexity mass with Peggye Li. Pathology: atypical meningioma, WHO grade II. TERT negative.  05/31/2021 - 07/16/2021 Radiation IMRT to the left frontal convexity resected typical meningioma total dose 57.6 Gy  07/31/2021 Radiology Study MRI post radiation for left frontal meningioma and for left temporal oligoastrocytoma surveillance. These two sites are stable. Small enhancing focus medial to left temporal resection cavity is apparent on today's scan comparable to July 2022. Will follow up with a short term MRI in another 6 weeks.   11/2022 - Chemotherapy  Tibsovo     04/29/24: Nodular progression left temporal; undergoes craniotomy, resection with Dr. Li.  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     06/04/24: Completes 25Gy/5 to resection site with Dr. 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 An MRI in October showed reduced inflammation and tumor size. He experiences transcortical motor aphasia, with increased difficulty in the afternoon, and a visual field defect with right superior quadrantanopia. No new seizures have occurred since surgery, indicating effective tumor control. Continue the current antiepileptic regimen and speech therapy.   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 The current antiepileptic regimen includes Lacosamide , Levetiracetam , Clonazepam, and Clobazam , with some noted fatigue but no significant side effects. Seizure control is prioritized over potential medication side effects. Continue the current antiepileptic regimen and monitor for seizure recurrence. Consider weaning medications if seizure-free for six months.   Transcortical motor aphasia   Post-surgical language difficulties are characterized by transcortical motor aphasia, with word finding and substitution issues, particularly in the afternoon. Speech therapy is ongoing. Continue speech therapy.   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   Gait disturbance   A slightly broad-based stooped gait is more pronounced with increased activity, likely related to post-surgical changes. No concerning features like Parkinsonism are present. Continue to monitor gait changes.  PAIN:  Are you having pain? No  FALLS: Has patient fallen in last 6 months?  No   PATIENT GOALS: Improve to the point of being able to work  OBJECTIVE:  Note: Objective measures were completed at Evaluation unless otherwise noted.   ORAL MOTOR EXAMINATION: Overall status: Did not assess due to time - will assess next session  STANDARDIZED ASSESSMENTS: B[place]ON NAMING TEST: (BNT-2): Pt scored 15/30 with odds presentation  of BNT-2 which is >2 standard deviations below the mean.         QUICK APHASIA BATTERY - 08/25/24 Summary      Word comprehension 10.00  Sentence comprehension 7.92  Word finding 5.75  Grammatical construction 5.50  Speech motor programming 10.00  Repetition 7.92  Reading 8.75  QAB overall 7.50   Pt's performance with this assessment indicates a mild to moderate aphasia mainly with pt's expressive language. Strength is reading and word and sentence comprehension. PT's conversational speech includes omitted content words, some semantic paraphasias, anomia fostering intermittently successful compensations. QAB overall score Severity 0.00-4.99  severe 5.00-7.49  moderate 7.50-8.89  mild 8.90-10.00  no aphasia   PATIENT REPORTED OUTCOME MEASURES (PROM):  RETURNED 09/14/24 Communication Effectiveness Survey - KAREN        How effective is your speech... Pt Rating  Having a conversation with a family member or friends at home 3  Participating in conversation with strangers in a quiet place  3  Conversing with a familiar person over the telephone 3  Conversing with a stranger over the telephone 2  Being part of a conversation in a noisy environment  2  Speaking to a friend when you are emotionally upset or angry 3  Having a conversation while traveling in the car 3  Having a conversation with someone at a distance 3  Total:  22   1= not at all effective                                                                      4= very effective    Communication Effectiveness Survey - Nathan Prince        How effective is your speech... Pt Rating  Having a conversation with a family member or friends at home 3  Participating in conversation with strangers in a quiet place  4  Conversing with a familiar person over the telephone 3  Conversing with a stranger over the telephone 3  Being part of a conversation  in a noisy environment  2  Speaking to a friend when you are emotionally upset or angry 3  Having a conversation while traveling in the car 3  Having a conversation with someone at a distance 2                                                                                               Total:  23/32   1= not at all effective                                                                      4= very effective                                                                                                                             TREATMENT DATE:   10/08/24: Pt brought all written work today for SLP. He functionally provided a presentation of approx 7 minutes. He req'd at least occasional mod-max A to produce written cues effective for him to use for this task. SLP and pt and [name] discussed the pressing item (which [name] presented and SLP affirmed) of pt's preponderance to take the conversation in a different direction (or repeat content of the conversation prior to the question) when he gives an answer to a question. Pt appeared surprised with this, possibly demonstrating decr'd awareness of errors. SLP affirmed pt's and wife's feelings about how communication can  become more difficult with more complex topics and reiterated that stopping and providing clarification can be helpful in these situations.   09/30/24: Pt again did not bring written work to present to SLP so SLP concentrated with pt on written tasks with some syntax scaffolding for word selection and proper syntax. Pt req'd mod A usually. SLP noted pt fatigue after approx 20-25 minutes. He unscrambled sentences with first word cue (8-10 words) with usual mod cues. SLP provided homework for pt for completion of today's tasks.  09/28/24: Pt did not bring written work today to give to SLP verbally for his one-minute verbal presentation. SLP asked pt to make sure he brings next session. SLP assisted pt with ways to ensure he has correct  information passed along to wife and to customer service reps for billpay on the phone. SLP discovered that pt has difficulty communicating numbers (times) with [name]. SLP suggested since reading is a relative strength of pt, that he read off appointment times as much as possible instead of saying them spontaneously which leads to more frequent misunderstandings. SLP also suggested pt read information off to *people* over the phone as much as he can, instead of using automated systems which he cannot request a repeat to verify he understands the automated instructions. SLP further suggested to pt and wife that Nathan Prince keep text messages simple and not try to say too much because that gives him more opportunity for error.  09/23/24: BILLPAY compensations reviewed next session.Today SLP worked with pt's error awareness and generating proper syntax with his rewritten sentences. Pt noted errors 90% of the time, and req'd mod-max cues from SLP to correct. Pt noted to fatigue after 25 minutes of task. Nathan Prince rewrote a thought about a topic which had good syntax, as his last thought. Pt to rewrite his thoughts for next session and read them to SLP and wife.  09/21/24: TALK ABOUT BILLPAY COMPENSATIONS NEXT SESSION. SLP assisted pt correct 2 of his last 3 homework selections from previous session Pt noted his errors 100%, but SLP necessary to provide mod-max cues for how to fix these errors. Pt omitted functor words and used near-correct vocabulary. Pt fatigue noted as session progressed as he initially made more suggestions to correct errors than after 35 minutes into the session. SLP observed (and [name] indicated) [name] was taking notes how to assist pt at home.   09/16/24:Talk about billpay compensations next session, with [name] present. Today in prep for pt speaking for one minute about a topic of choice, SLP and pt corrected pt's grammar and his wording. When pt read what he wrote prior to last session he demonstrated  understanding that it did not make sense but req'd consistent mod-max cues for problem solving how to correct the phrasing. Pt to correct the last three points at home and then write out the correct responses at home for next session.   09/14/24: Today PROM returned with scores above. 23/32-Nathan Prince and 22/32-Karen, with higher scores indicating less impact of speech on pt's communicative situations.  Today SLP used pt's 10 facts about [employer] to target clarity of expressive language with written support. SLP also used occasional mod cues to assist pt in communicating his idea more accurately. Written cues were helpful for Nathan Prince. Usually, pt was unable to recall words he had just verbalized 10-15 seconds prior, when cued by SLP to write down the spoken thought. Homework to complete pursuit of happiness concept.  09/08/24: PROM to be brought back next session.  Today SLP worked with pt to develop word finding and accuracy with verbal expression with divergent naming task. With mod complex categories pt able to generate average 6 items without cues. Pt noted to have more difficulty with spelling after 20 minutes. SLP educated pt/Karen about work-break cycle. Homework to write at least 10 2-sentence facts about the [employer] revolution that SLP might not know.    09/06/24: PROM not brought back. SLP provided another copy for [name] and pt to fill out and return next session. Based on pt/wife report last session, SLP worked with pt to develop scripts for [employer], and a baguette. SLP discussed with pt/wife how these could be helpful for more than just at restaurants - SLP suggested pt and [name] develop these on 3x5 cards to practice and take with him.[name] demonstrated understanding how to develop cards/scripts with pt today.   08/30/24: SLP introduced Online/app-based therapy and [employer] for home-based therapy tasks. See pt instructions. Education occurred for pt/wife around the  neurological processes that occur with practice. Pt's wife reported a challenging situation is ordering at restaurants with pt difficulty with asking questions: are these real potatoes? Is there sugar in the cornbread? Pt would order half diet, half regular Coke, and prefers a baguette with his soup. Another situation is longer and deeper conversations, and a difficulty with pronouns. SLP to address these situations in upcoming sessions.   08/25/24: Needs PROM next session, and introduction to Talk [employer] therapy (and similar tech-based options) next session. Pt brought homework in, completed. SLP assisted pt with responsive naming task as pt's responses were not 100% the category targeted. Pt benefited from mod semantic cues, and rarely from phonemic cues. QAB completed today with pt. Scores above.in standardized assessments. SLP provided homework for pt until next session. Told him to work at least 20-30 minutes (or until mentally fatigued) as often as he likes during the day.   08/23/24: Pt needs QAB and PROM next session. 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 SLP ascertained that pt largely does not benefit from phonemic cues and shared this with Nathan Prince and [name]. Lastly, SLP talked briefly with pt and wife about past ST and tasks that SLP performed with Nathan Prince. SLP encouraged Nathan Prince to cont with those tasks at home for more practice.   PATIENT EDUCATION: Education details: see treatment date Person educated: Patient and Spouse Education method: Explanation, Demonstration, and Handouts Education comprehension: verbalized understanding and needs further education   GOALS: Goals reviewed with patient? No  SHORT TERM GOALS: Target date: 09/24/24  Pt will name 8 items in a simple-mod complex category, using trained compensations, 85%  of opportunities   Baseline: Goal status: deferred to work on more salient things for pt  2.  Pt will demo use of trained compensatory strategies in 5 minutes simple conversation x3 sessions Baseline:  Goal status: deferred to work on more salient things for pt  3.  Pt will develop a 1-minute presentation on topic of choice using trained compensations  Baseline:  Goal status: partially met  4.  Pt will deliver a 1-minute presentation using trained compensations Baseline:  Goal status: deferred one week  5.  Pt will demo use of speech to text for functional completion of 1-sentence text messages Baseline:  Goal status: deferred to work on salient things for  pt  6.  With trained compensations pt will complete billpay via telephone with 100% success in 2/2 opportunities Baseline:  Goal status: deferred due to pt reads billpay amounts accurately  7.  Pt will complete QAB or other aphasia assessment Baseline:  Goal status: met  LONG TERM GOALS: Target date:  11/26/24  Pt will develop a 3-minute presentation about a topic of choice with mod I Baseline:  Goal status: partially met and d/c  2.  Pt will deliver a 3-minute presentation with mod I Baseline:  Goal status: met  3.  Pt will demo use of speech to text for functional completion of 2-sentence text messages   Baseline:  Goal status: not met due to focus on presentation, and d/c  4.  With trained compensations pt will complete billpay via telephone with 100% success in at least 2/2 opportunities Baseline:  Goal status: defer due to work on presentation, and d/c    5.  Pt will implement trained compensations in 3 opportunities, between sessions, when he thinks he is getting anxious about making errors in verbal expression outside of ST Baseline:  Goal status: not met/continue  5.  Pt will improve PROM Baseline:  Goal status: not met/provided on last week  of ST  6.  Pt will develop a 9-minute presentation with  occasional mod A Baseline:  Goal status: INITIAL  7.   Pt will deliver a 9-minute presentation with mod I Baseline:  Goal status: INITIAL  8.   Pt will demo understanding of comprehension errors with redirection to error made in order to enhance conversation effectiveness  Baseline:  Goal status: INITIAL   ASSESSMENT:  CLINICAL IMPRESSION: RECERT TODAY. Patient is a 59 y.o. M who was seen today for treatment of speech and language in light of hx of seizures and of tumor resection. See treatment date above for today's date for further details on today's session. Lt-sided brain tumor resected in August 2025 with radiation completed to the affected area. He has had a course of HHST since that sx. On date of eval [name] (wife) states pt uses pronouns interchangeably making tracking his conversation challenging at times. Lastly, his language declines when he experiences difficulty due to anxiety about making errors. [name] indicated pt is still working through some depression regarding his current situation. 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  OBJECTIVE IMPAIRMENTS: include aphasia. These impairments are limiting patient from managing medications, managing appointments, managing finances, household responsibilities, ADLs/IADLs, and effectively communicating at home and in community. Factors affecting potential to achieve goals and functional outcome are co-morbidities, medical prognosis, and severity of impairments. Patient will benefit from skilled SLP services to address above impairments and improve overall function.  REHAB POTENTIAL: Fair given above factors  PLAN:  SLP FREQUENCY: 2x/week  SLP DURATION: 4 weeks, or 20 sessions  PLANNED INTERVENTIONS: Language facilitation, Environmental controls, Cueing hierachy, Internal/external aids, Functional tasks, Multimodal communication approach, SLP instruction and feedback,  Compensatory strategies, Patient/family education, and [id number] Treatment of speech (30 or 45 min)     Nathan Prince, CCC-SLP 10/08/2024, 12:19 PM      "

## 2024-10-12 ENCOUNTER — Ambulatory Visit

## 2024-10-12 DIAGNOSIS — R4701 Aphasia: Secondary | ICD-10-CM

## 2024-10-13 ENCOUNTER — Inpatient Hospital Stay: Attending: Internal Medicine

## 2024-10-13 ENCOUNTER — Telehealth: Payer: Self-pay | Admitting: Internal Medicine

## 2024-10-13 DIAGNOSIS — C719 Malignant neoplasm of brain, unspecified: Secondary | ICD-10-CM

## 2024-10-13 LAB — CBC WITH DIFFERENTIAL (CANCER CENTER ONLY)
Abs Immature Granulocytes: 0.02 10*3/uL (ref 0.00–0.07)
Basophils Absolute: 0 10*3/uL (ref 0.0–0.1)
Basophils Relative: 0 %
Eosinophils Absolute: 0 10*3/uL (ref 0.0–0.5)
Eosinophils Relative: 0 %
HCT: 43.4 % (ref 39.0–52.0)
Hemoglobin: 15.2 g/dL (ref 13.0–17.0)
Immature Granulocytes: 0 %
Lymphocytes Relative: 16 %
Lymphs Abs: 1.1 10*3/uL (ref 0.7–4.0)
MCH: 32.1 pg (ref 26.0–34.0)
MCHC: 35 g/dL (ref 30.0–36.0)
MCV: 91.6 fL (ref 80.0–100.0)
Monocytes Absolute: 0.6 10*3/uL (ref 0.1–1.0)
Monocytes Relative: 9 %
Neutro Abs: 5.1 10*3/uL (ref 1.7–7.7)
Neutrophils Relative %: 75 %
Platelet Count: 89 10*3/uL — ABNORMAL LOW (ref 150–400)
RBC: 4.74 MIL/uL (ref 4.22–5.81)
RDW: 14.5 % (ref 11.5–15.5)
WBC Count: 6.9 10*3/uL (ref 4.0–10.5)
nRBC: 0 % (ref 0.0–0.2)

## 2024-10-13 LAB — CMP (CANCER CENTER ONLY)
ALT: 26 U/L (ref 0–44)
AST: 22 U/L (ref 15–41)
Albumin: 4.4 g/dL (ref 3.5–5.0)
Alkaline Phosphatase: 95 U/L (ref 38–126)
Anion gap: 10 (ref 5–15)
BUN: 18 mg/dL (ref 6–20)
CO2: 26 mmol/L (ref 22–32)
Calcium: 9.6 mg/dL (ref 8.9–10.3)
Chloride: 104 mmol/L (ref 98–111)
Creatinine: 1.08 mg/dL (ref 0.61–1.24)
GFR, Estimated: >60 mL/min
Glucose, Bld: 92 mg/dL (ref 70–99)
Potassium: 4.1 mmol/L (ref 3.5–5.1)
Sodium: 141 mmol/L (ref 135–145)
Total Bilirubin: 0.4 mg/dL (ref 0.0–1.2)
Total Protein: 7 g/dL (ref 6.5–8.1)

## 2024-10-13 NOTE — Telephone Encounter (Signed)
 Patients wife called wanting to move labs from tomorrow to today. They will be coming at 11:45 for labs today and are aware.

## 2024-10-14 ENCOUNTER — Ambulatory Visit

## 2024-10-14 ENCOUNTER — Inpatient Hospital Stay

## 2024-10-14 DIAGNOSIS — R4701 Aphasia: Secondary | ICD-10-CM

## 2024-10-14 NOTE — Therapy (Signed)
 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   Depression    Low testosterone  03/30/2013   Radiation 11/04/11-12/19/11   Left temporal brain 59.4 gray in 33 fractions   Skin cancer 09/09/1986   Past Surgical History:  Procedure Laterality Date   BACK SURGERY  09/09/1994   ruptured disc   CRANIOTOMY  09/04/2011   Procedure: CRANIOTOMY TUMOR EXCISION;  Surgeon: Catalina CHRISTELLA Stains;  Location: [facility];  Service: Neurosurgery;  Laterality: Left;  Left Temporal craniectomy for tumor   MASS BIOPSY  09/05/2011   left temporal lobe   skin cancer removal  1988   Removed skin cancer to left chest   Patient Active Problem List   Diagnosis Date Noted   Stroke-like symptoms 09/22/2022   Seizure (HCC) 09/22/2022   Low testosterone  03/30/2013   Depression    Skin cancer    Radiation    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 What is it?   Pt accompanied by: self   PERTINENT HISTORY: Pt has had a course of home health ST between 05-10-24 and today. VASLOW-07/12/24: 04/20/2021 Initial Diagnosis Atypical meningioma of brain (CMS-HCC)  04/20/2021 Surgery Resection of left frontal convexity mass with Peggye Li. Pathology: atypical meningioma, WHO grade II. TERT negative.  05/31/2021 - 07/16/2021 Radiation IMRT to the left frontal convexity resected typical  meningioma total dose 57.6 Gy  07/31/2021 Radiology Study MRI post radiation for left frontal meningioma and for left temporal oligoastrocytoma surveillance. These two sites are stable. Small enhancing focus medial to left temporal resection cavity is apparent on today's scan comparable to July 2022. Will follow up with a short term MRI in another 6 weeks.   11/2022 - Chemotherapy  Tibsovo     04/29/24: Nodular progression left temporal; undergoes craniotomy, resection with Dr. Li.  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     06/04/24: Completes 25Gy/5 to resection site with Dr. 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 An MRI in October showed reduced inflammation and tumor size. He experiences transcortical motor aphasia, with increased difficulty in the afternoon, and a visual field defect with right superior quadrantanopia. No new seizures have occurred since surgery, indicating effective tumor control. Continue the current antiepileptic regimen and speech therapy.   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 The current antiepileptic regimen includes Lacosamide , Levetiracetam , Clonazepam, and Clobazam , with some noted fatigue but no significant side effects. Seizure control is prioritized over potential medication side effects. Continue the current antiepileptic regimen and monitor for seizure recurrence. Consider weaning medications  if seizure-free for six months.   Transcortical motor aphasia   Post-surgical language difficulties are  characterized by transcortical motor aphasia, with word finding and substitution issues, particularly in the afternoon. Speech therapy is ongoing. Continue speech therapy.   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   Gait disturbance   A slightly broad-based stooped gait is more pronounced with increased activity, likely related to post-surgical changes. No concerning features like Parkinsonism are present. Continue to monitor gait changes.  PAIN:  Are you having pain? No  FALLS: Has patient fallen in last 6 months?  No   PATIENT GOALS: Improve to the point of being able to work  OBJECTIVE:  Note: Objective measures were completed at Evaluation unless otherwise noted.  STANDARDIZED ASSESSMENTS: [place] NAMING TEST: (BNT-2): Pt scored 15/30 with odds presentation of BNT-2 which is >2 standard deviations below the mean.         QUICK APHASIA BATTERY - 08/25/24 Summary      Word comprehension 10.00  Sentence comprehension 7.92  Word finding 5.75  Grammatical construction 5.50  Speech motor programming 10.00  Repetition 7.92  Reading 8.75  QAB overall 7.50   Pt's performance with this assessment indicates a mild to moderate aphasia mainly with pt's expressive language. Strength is reading and word and sentence comprehension. PT's conversational speech includes omitted content words, some semantic paraphasias, anomia fostering intermittently successful compensations. QAB overall score Severity 0.00-4.99  severe 5.00-7.49  moderate 7.50-8.89  mild 8.90-10.00  no aphasia   PATIENT REPORTED OUTCOME MEASURES (PROM):  RETURNED 09/14/24 Communication Effectiveness Survey - KAREN        How effective is your speech... Pt Rating  Having a conversation with a family member or friends at home 3  Participating in conversation with strangers in a quiet place  3  Conversing with a familiar person over the telephone 3  Conversing  with a stranger over the telephone 2  Being part of a conversation in a noisy environment  2  Speaking to a friend when you are emotionally upset or angry 3  Having a conversation while traveling in the car 3  Having a conversation with someone at a distance 3                                                                                               Total:  22   1= not at all effective                                                                      4= very effective    Communication Effectiveness Survey - RAY        How effective is your speech... Pt Rating  Having a conversation with a family member or friends at home 3  Participating in conversation with strangers in a  quiet place  4  Conversing with a familiar person over the telephone 3  Conversing with a stranger over the telephone 3  Being part of a conversation in a noisy environment  2  Speaking to a friend when you are emotionally upset or angry 3  Having a conversation while traveling in the car 3  Having a conversation with someone at a distance 2                                                                                               Total:  23/32   1= not at all effective                                                                      4= very effective                                                                                                                             TREATMENT DATE:   10/14/24: Pt brought his bird pages back and SLP honed them to make them more useful for pt (e.g., titmouse instead of tufted titmouse, etc). SLP covered up names of birds x5 and pt named bird 4/5 without the word. SLP educated [name] about how to assist pt with these birds (cueing strategies) at home. She demonstrated understanding. SLP reinforced with pt to use these if he has difficulty naming the birds in the moment. SLP suggested pt and SLP work on more of these homework projects and through therapeutic conversation  SLP learned pt would like to do this for members of his small group at church. [name] will not be able to attend next weeks therapy sessions so SLP and pt may have to start with this the week after next.   10/12/24: Pt told SLP at beginning of session that he enjoyed watching birds so SLP had pt name birds he would see in his yard. He thought of 4 on his own prior to requiring min-max cues for the remaining 9. Semantic, picture, and pt's own written cues were most effective for pt to generate items in this category. SLP suggested pt find pictures for each of these birds and write the names of each next to the picture; He should keep these sheets near the window or door he looks out of  when calling the bird names. Pt will bring these sheets next session.    10/08/24: Pt brought all written work today for SLP. He functionally provided a presentation of approx 7 minutes. He req'd at least occasional mod-max A to produce written cues effective for him to use for this task. SLP and pt and [name] discussed the pressing item (which [name] presented and SLP affirmed) of pt's preponderance to take the conversation in a different direction (or repeat content of the conversation prior to the question) when he gives an answer to a question. Pt appeared surprised with this, possibly demonstrating decr'd awareness of errors. SLP affirmed pt's and wife's feelings about how communication can become more difficult with more complex topics and reiterated that stopping and providing clarification can be helpful in these situations.   09/30/24: Pt again did not bring written work to present to SLP so SLP concentrated with pt on written tasks with some syntax scaffolding for word selection and proper syntax. Pt req'd mod A usually. SLP noted pt fatigue after approx 20-25 minutes. He unscrambled sentences with first word cue (8-10 words) with usual mod cues. SLP provided homework for pt for completion of today's tasks.  09/28/24: Pt did  not bring written work today to give to SLP verbally for his one-minute verbal presentation. SLP asked pt to make sure he brings next session. SLP assisted pt with ways to ensure he has correct information passed along to wife and to customer service reps for billpay on the phone. SLP discovered that pt has difficulty communicating numbers (times) with [name]. SLP suggested since reading is a relative strength of pt, that he read off appointment times as much as possible instead of saying them spontaneously which leads to more frequent misunderstandings. SLP also suggested pt read information off to *people* over the phone as much as he can, instead of using automated systems which he cannot request a repeat to verify he understands the automated instructions. SLP further suggested to pt and wife that Ray keep text messages simple and not try to say too much because that gives him more opportunity for error.  09/23/24: BILLPAY compensations reviewed next session.Today SLP worked with pt's error awareness and generating proper syntax with his rewritten sentences. Pt noted errors 90% of the time, and req'd mod-max cues from SLP to correct. Pt noted to fatigue after 25 minutes of task. Ray rewrote a thought about a topic which had good syntax, as his last thought. Pt to rewrite his thoughts for next session and read them to SLP and wife.  09/21/24: TALK ABOUT BILLPAY COMPENSATIONS NEXT SESSION. SLP assisted pt correct 2 of his last 3 homework selections from previous session Pt noted his errors 100%, but SLP necessary to provide mod-max cues for how to fix these errors. Pt omitted functor words and used near-correct vocabulary. Pt fatigue noted as session progressed as he initially made more suggestions to correct errors than after 35 minutes into the session. SLP observed (and [name] indicated) [name] was taking notes how to assist pt at home.   09/16/24:Talk about billpay compensations next session, with [name]  present. Today in prep for pt speaking for one minute about a topic of choice, SLP and pt corrected pt's grammar and his wording. When pt read what he wrote prior to last session he demonstrated understanding that it did not make sense but req'd consistent mod-max cues for problem solving how to correct the phrasing. Pt to correct the last three points at  home and then write out the correct responses at home for next session.   09/14/24: Today PROM returned with scores above. 23/32-Ray and 22/32-Karen, with higher scores indicating less impact of speech on pt's communicative situations.  Today SLP used pt's 10 facts about [employer] to target clarity of expressive language with written support. SLP also used occasional mod cues to assist pt in communicating his idea more accurately. Written cues were helpful for Ray. Usually, pt was unable to recall words he had just verbalized 10-15 seconds prior, when cued by SLP to write down the spoken thought. Homework to complete pursuit of happiness concept.   PATIENT EDUCATION: Education details: see treatment date Person educated: Patient and Spouse Education method: Medical Illustrator Education comprehension: verbalized understanding and needs further education   GOALS: Goals reviewed with patient? No  SHORT TERM GOALS: Target date: 09/24/24  Pt will name 8 items in a simple-mod complex category, using trained compensations, 85% of opportunities   Baseline: Goal status: deferred to work on more salient things for pt  2.  Pt will demo use of trained compensatory strategies in 5 minutes simple conversation x3 sessions Baseline:  Goal status: deferred to work on more salient things for pt  3.  Pt will develop a 1-minute presentation on topic of choice using trained compensations  Baseline:  Goal status: partially met  4.  Pt will deliver a 1-minute presentation using trained compensations Baseline:  Goal status: deferred  one week  5.  Pt will demo use of speech to text for functional completion of 1-sentence text messages Baseline:  Goal status: deferred to work on salient things for pt  6.  With trained compensations pt will complete billpay via telephone with 100% success in 2/2 opportunities Baseline:  Goal status: deferred due to pt reads billpay amounts accurately  7.  Pt will complete QAB or other aphasia assessment Baseline:  Goal status: met  LONG TERM GOALS: Target date:  11/26/24  Pt will develop a 3-minute presentation about a topic of choice with mod I Baseline:  Goal status: partially met and d/c  2.  Pt will deliver a 3-minute presentation with mod I Baseline:  Goal status: met  3.  Pt will demo use of speech to text for functional completion of 2-sentence text messages   Baseline:  Goal status: not met due to focus on presentation, and d/c  4.  With trained compensations pt will complete billpay via telephone with 100% success in at least 2/2 opportunities Baseline:  Goal status: defer due to work on presentation, and d/c    5.  Pt will implement trained compensations in 3 opportunities, between sessions, when he thinks he is getting anxious about making errors in verbal expression outside of ST Baseline:  Goal status: not met/continue  5.  Pt will improve PROM Baseline:  Goal status: not met/provided on last week  of ST  6.  Pt will develop a 9-minute presentation with occasional mod A Baseline:  Goal status: INITIAL  7.   Pt will deliver a 9-minute presentation with mod I Baseline:  Goal status: INITIAL  8.   Pt will demo understanding of comprehension errors with redirection to error made in order to enhance conversation effectiveness  Baseline:  Goal status: INITIAL   ASSESSMENT:  CLINICAL IMPRESSION: Patient is a 59 y.o. M who was seen today for treatment of speech and language in light of hx of seizures and of tumor resection. See treatment date above  for  today's date for further details on today's session. Lt-sided brain tumor resected in August 2025 with radiation completed to the affected area. He has had a course of HHST since that sx. On date of eval [name] (wife) states pt uses pronouns interchangeably making tracking his conversation challenging at times. Lastly, his language declines when he experiences difficulty due to anxiety about making errors. [name] indicated pt is still working through some depression regarding his current situation. 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  OBJECTIVE IMPAIRMENTS: include aphasia. These impairments are limiting patient from managing medications, managing appointments, managing finances, household responsibilities, ADLs/IADLs, and effectively communicating at home and in community. Factors affecting potential to achieve goals and functional outcome are co-morbidities, medical prognosis, and severity of impairments. Patient will benefit from skilled SLP services to address above impairments and improve overall function.  REHAB POTENTIAL: Fair given above factors  PLAN:  SLP FREQUENCY: 2x/week  SLP DURATION: 4 weeks, or 20 sessions  PLANNED INTERVENTIONS: Language facilitation, Environmental controls, Cueing hierachy, Internal/external aids, Functional tasks, Multimodal communication approach, SLP instruction and feedback, Compensatory strategies, Patient/family education, and [id number] Treatment of speech (30 or 45 min)     Abiel Antrim, CCC-SLP 10/14/2024, 5:15 PM      "

## 2024-10-19 ENCOUNTER — Ambulatory Visit

## 2024-10-21 ENCOUNTER — Ambulatory Visit

## 2024-10-25 ENCOUNTER — Inpatient Hospital Stay

## 2024-10-25 ENCOUNTER — Inpatient Hospital Stay: Admitting: Internal Medicine

## 2024-10-26 ENCOUNTER — Ambulatory Visit

## 2024-10-28 ENCOUNTER — Ambulatory Visit

## 2024-11-02 ENCOUNTER — Ambulatory Visit

## 2024-11-04 ENCOUNTER — Ambulatory Visit

## 2024-11-09 ENCOUNTER — Ambulatory Visit

## 2024-11-11 ENCOUNTER — Ambulatory Visit

## 2024-11-16 ENCOUNTER — Ambulatory Visit

## 2024-11-18 ENCOUNTER — Ambulatory Visit
